# Patient Record
Sex: Male | Born: 1935 | Race: Black or African American | Hispanic: No | Marital: Married | Smoking: Former smoker
Health system: Southern US, Community
[De-identification: ages and names within clinical notes are randomized; demographics above are authoritative.]

## PROBLEM LIST (undated history)

## (undated) DIAGNOSIS — N189 Chronic kidney disease, unspecified: Secondary | ICD-10-CM

## (undated) DIAGNOSIS — R339 Retention of urine, unspecified: Secondary | ICD-10-CM

## (undated) DIAGNOSIS — I639 Cerebral infarction, unspecified: Secondary | ICD-10-CM

## (undated) DIAGNOSIS — E7800 Pure hypercholesterolemia, unspecified: Secondary | ICD-10-CM

## (undated) DIAGNOSIS — K4090 Unilateral inguinal hernia, without obstruction or gangrene, not specified as recurrent: Secondary | ICD-10-CM

## (undated) DIAGNOSIS — I4891 Unspecified atrial fibrillation: Secondary | ICD-10-CM

## (undated) DIAGNOSIS — I10 Essential (primary) hypertension: Secondary | ICD-10-CM

## (undated) HISTORY — PX: APPENDECTOMY: SHX54

## (undated) HISTORY — DX: Retention of urine, unspecified: R33.9

---

## 2004-04-07 ENCOUNTER — Inpatient Hospital Stay: Payer: Self-pay | Admitting: Internal Medicine

## 2004-04-09 ENCOUNTER — Ambulatory Visit: Payer: Self-pay | Admitting: Cardiology

## 2004-04-09 ENCOUNTER — Inpatient Hospital Stay (HOSPITAL_COMMUNITY): Admission: AD | Admit: 2004-04-09 | Discharge: 2004-04-25 | Payer: Self-pay | Admitting: Neurology

## 2004-04-14 ENCOUNTER — Ambulatory Visit: Payer: Self-pay | Admitting: Physical Medicine & Rehabilitation

## 2004-04-25 ENCOUNTER — Inpatient Hospital Stay (HOSPITAL_COMMUNITY)
Admission: RE | Admit: 2004-04-25 | Discharge: 2004-05-26 | Payer: Self-pay | Admitting: Physical Medicine & Rehabilitation

## 2004-06-17 ENCOUNTER — Ambulatory Visit (HOSPITAL_COMMUNITY)
Admission: RE | Admit: 2004-06-17 | Discharge: 2004-06-17 | Payer: Self-pay | Admitting: Physical Medicine & Rehabilitation

## 2004-06-20 ENCOUNTER — Ambulatory Visit: Payer: Self-pay | Admitting: Cardiology

## 2004-06-21 ENCOUNTER — Encounter: Payer: Self-pay | Admitting: Internal Medicine

## 2004-06-25 ENCOUNTER — Encounter: Payer: Self-pay | Admitting: Internal Medicine

## 2004-07-05 ENCOUNTER — Encounter
Admission: RE | Admit: 2004-07-05 | Discharge: 2004-10-03 | Payer: Self-pay | Admitting: Physical Medicine & Rehabilitation

## 2004-07-07 ENCOUNTER — Ambulatory Visit: Payer: Self-pay | Admitting: Physical Medicine & Rehabilitation

## 2004-07-25 ENCOUNTER — Encounter: Payer: Self-pay | Admitting: Internal Medicine

## 2004-08-23 ENCOUNTER — Ambulatory Visit: Payer: Self-pay | Admitting: Cardiology

## 2004-08-25 ENCOUNTER — Encounter: Payer: Self-pay | Admitting: Internal Medicine

## 2004-08-31 ENCOUNTER — Ambulatory Visit (HOSPITAL_COMMUNITY)
Admission: RE | Admit: 2004-08-31 | Discharge: 2004-08-31 | Payer: Self-pay | Admitting: Physical Medicine & Rehabilitation

## 2004-09-24 ENCOUNTER — Inpatient Hospital Stay: Payer: Self-pay | Admitting: Internal Medicine

## 2004-09-24 ENCOUNTER — Other Ambulatory Visit: Payer: Self-pay

## 2004-09-25 ENCOUNTER — Other Ambulatory Visit: Payer: Self-pay

## 2004-10-21 ENCOUNTER — Encounter: Payer: Self-pay | Admitting: Internal Medicine

## 2004-10-25 ENCOUNTER — Encounter: Payer: Self-pay | Admitting: Internal Medicine

## 2004-11-04 ENCOUNTER — Encounter
Admission: RE | Admit: 2004-11-04 | Discharge: 2005-02-02 | Payer: Self-pay | Admitting: Physical Medicine & Rehabilitation

## 2004-11-04 ENCOUNTER — Ambulatory Visit: Payer: Self-pay | Admitting: Physical Medicine & Rehabilitation

## 2004-11-25 ENCOUNTER — Encounter: Payer: Self-pay | Admitting: Internal Medicine

## 2004-11-30 ENCOUNTER — Ambulatory Visit: Payer: Self-pay | Admitting: Physical Medicine & Rehabilitation

## 2004-12-25 ENCOUNTER — Encounter: Payer: Self-pay | Admitting: Internal Medicine

## 2004-12-30 ENCOUNTER — Ambulatory Visit: Payer: Self-pay | Admitting: Physical Medicine & Rehabilitation

## 2005-01-25 ENCOUNTER — Encounter: Payer: Self-pay | Admitting: Internal Medicine

## 2005-02-24 ENCOUNTER — Encounter: Payer: Self-pay | Admitting: Internal Medicine

## 2005-02-25 ENCOUNTER — Encounter
Admission: RE | Admit: 2005-02-25 | Discharge: 2005-05-26 | Payer: Self-pay | Admitting: Physical Medicine & Rehabilitation

## 2005-02-25 ENCOUNTER — Ambulatory Visit: Payer: Self-pay | Admitting: Physical Medicine & Rehabilitation

## 2006-10-15 IMAGING — CT CT HEAD W/O CM
1 of 2 series · 13 of 30 positions shown, 17 images · non-contrast
Comparison: 04/10/2004

CLINICAL DATA: Intracerebral hemorrhage

HEAD CT WITHOUT CONTRAST
TECHNIQUE: 5mm collimated images were obtained from the base of the skull
through the vertex according to standard protocol without contrast.

[Series 2: brain · axial · 0.47mm/px · z∈[+128,+253]mm · 13 of 32 slices shown, 17 images]
[im 3/32  brain]
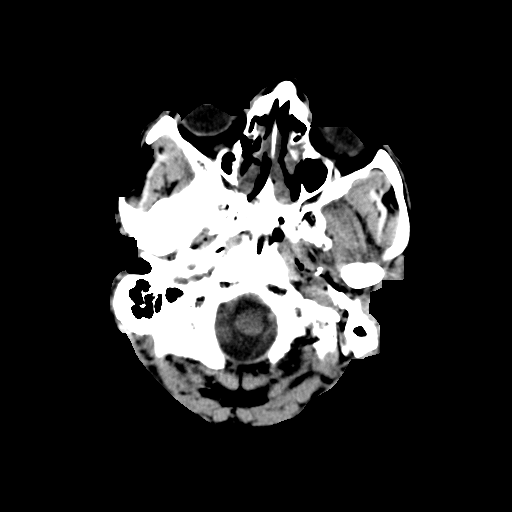
[im 3/32  bone]
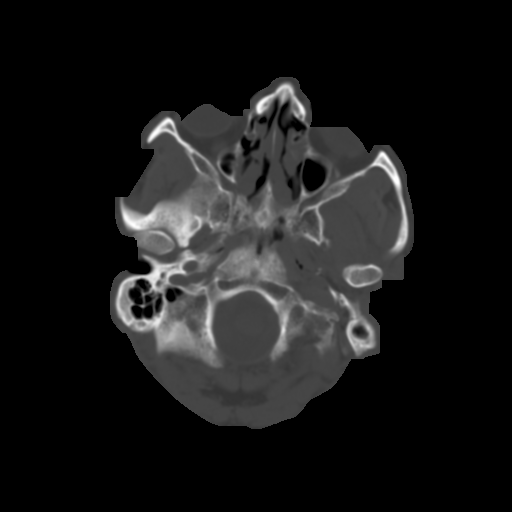
[im 5/32  brain]
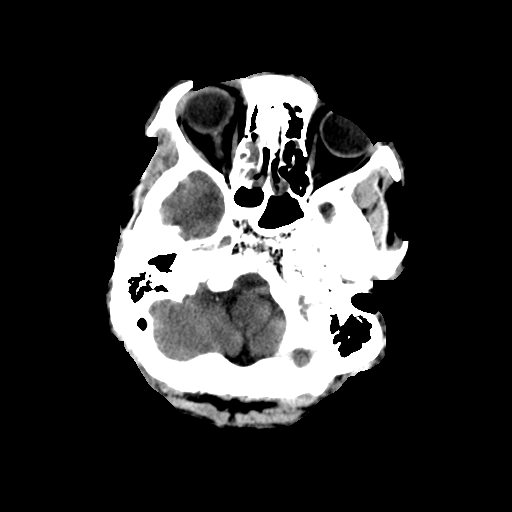
[im 7/32  brain]
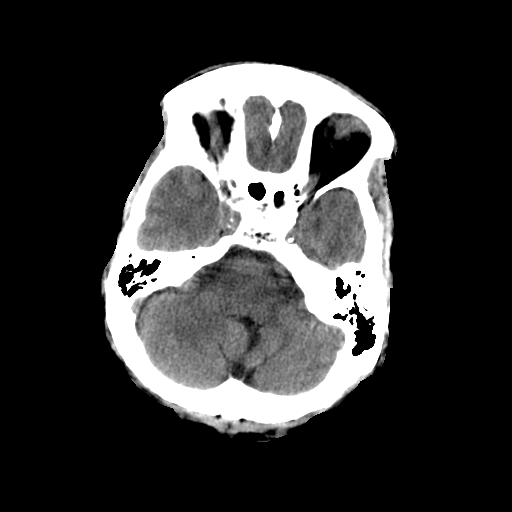
[im 9/32  brain]
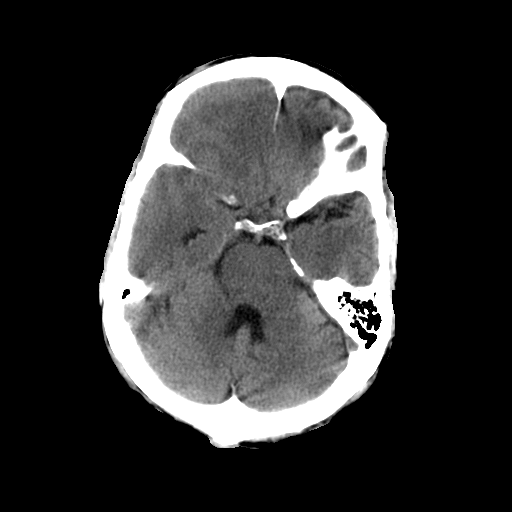
[im 12/32  brain]
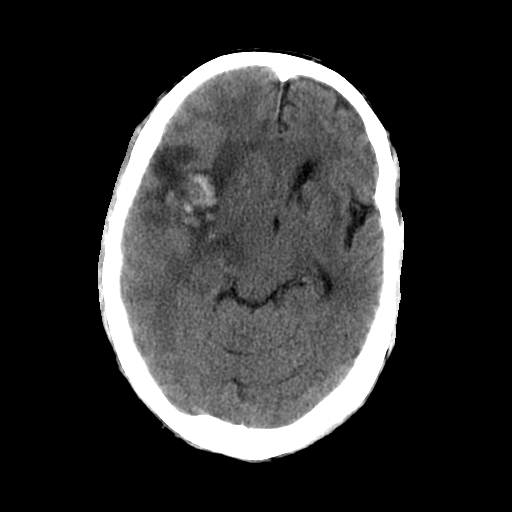
[im 12/32  bone]
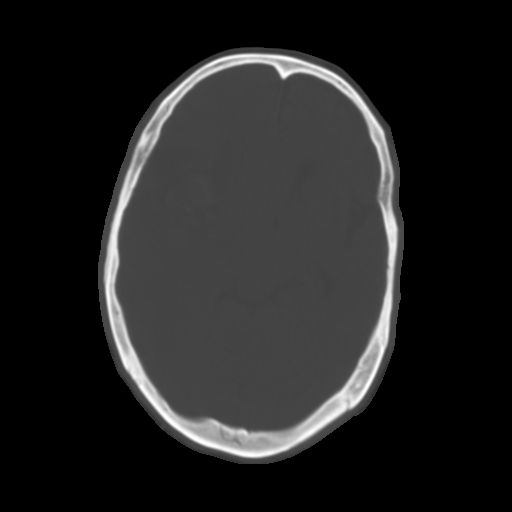
[im 14/32  brain]
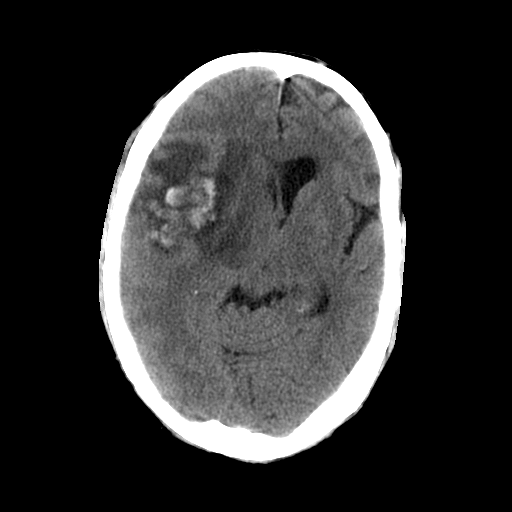
[im 16/32  brain]
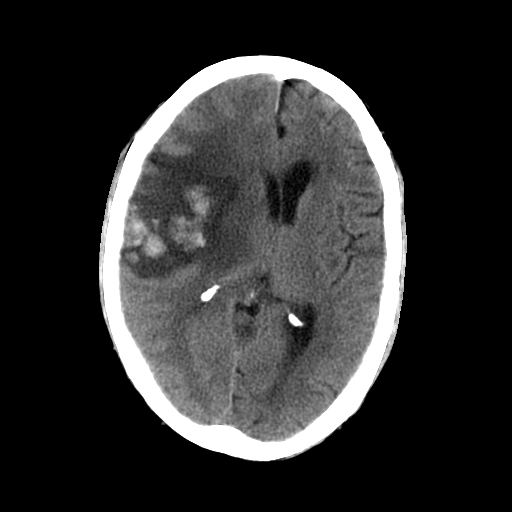
[im 18/32  brain]
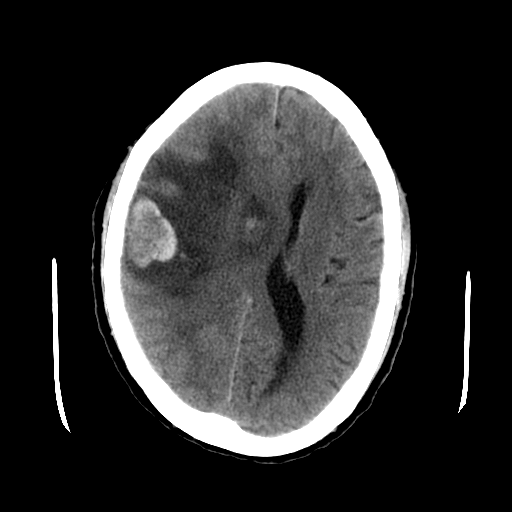
[im 20/32  brain]
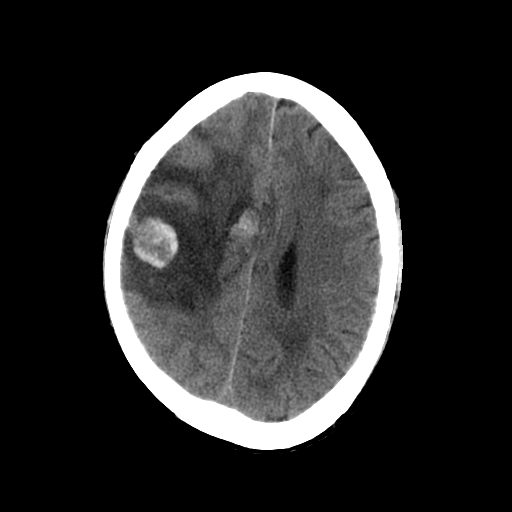
[im 20/32  bone]
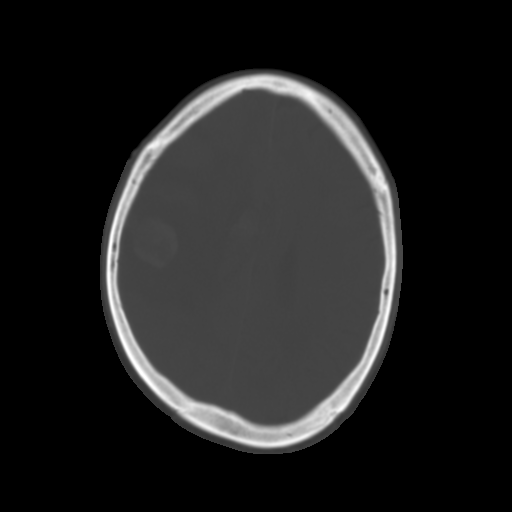
[im 23/32  brain]
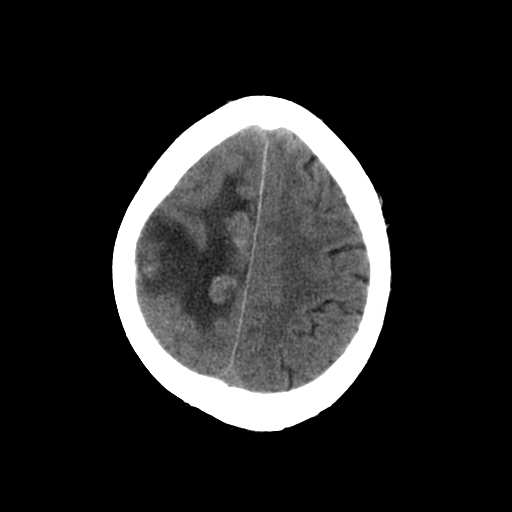
[im 25/32  brain]
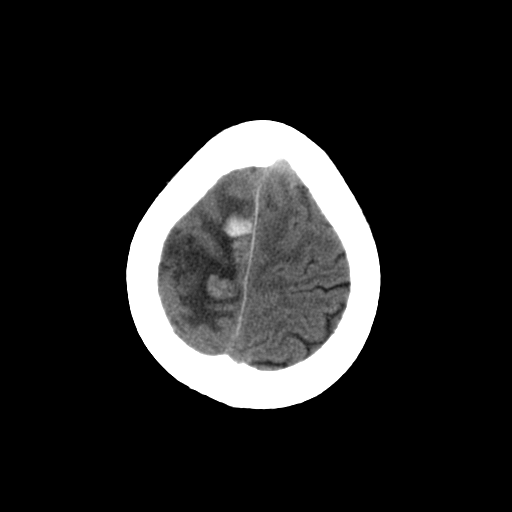
[im 27/32  brain]
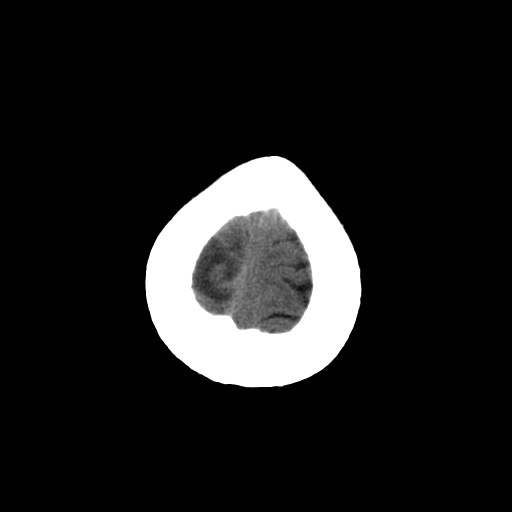
[im 29/32  brain]
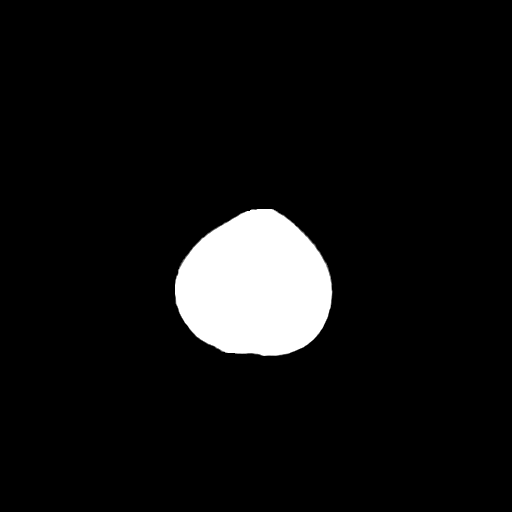
[im 29/32  bone]
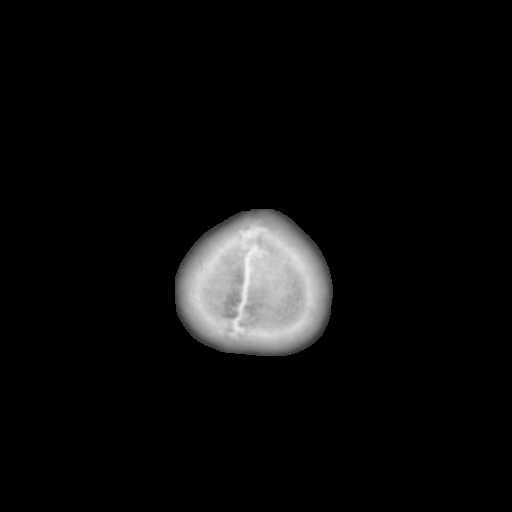

[13 of 30 positions shown; findings below may reference images not displayed]

FINDINGS: Again noted is the multifocal right cerebral hemorrhage, most
predominant in the right frontal and parietal lobes. There is surrounding edema
with mass-effect. Increasing midline shift noted, now approximately 17 mm.
Stable mild dilatation of the temporal horn of the right lateral ventricle.

IMPRESSION

Multifocal right cerebral hemorrhage, with increasing midline shift, now 17 mm
from right-to-left

## 2006-10-23 IMAGING — NM NM MYOCAR MULTI W/ SPECT
1 series · 1 of 1 positions shown · non-contrast
Comparison: None.

MYOCARDIAL IMAGING WITH SPECT (REST AND PHARMACOLOGIC-STRESS)  04/19/2004:

CLINICAL DATA: 76-year-old with history of MI, stroke, and hypertension.
TECHNIQUE: Standard myocardial SPECT imaging was performed after resting
intravenous injection of 10 mCi Yc-OOm Myoview .  Subsequently, intravenous
infusion of adenosine was performed under the supervision of the Cardiology
staff.  At peak effect of the drug, 30 mCi Yc-OOm Myoview was injected
intravenously and standard myocardial SPECT imaging was performed.  Quantitative
gated imaging was not performed due to arrhythmia.

[rc rest cardiolite · 1 of 1 slices shown]
[im 1/1]
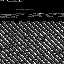

[1 of 1 positions shown; findings below may reference images not displayed]

FINDINGS: The immediate post adenosine images demonstrates diminished uptake in
the inferior wall relative to the remainder of the myocardium. The initial
resting images showed similar findings. There is no evidence of reversibility to
suggest ischemia. This is confirmed by the computer-generated polar map.

Again, gating was not performed due to arrhythmia.
IMPRESSION: Inferior wall thinning versus scar. No evidence of myocardial ischemia.

## 2010-04-16 ENCOUNTER — Encounter: Payer: Self-pay | Admitting: Physical Medicine & Rehabilitation

## 2010-04-17 ENCOUNTER — Encounter: Payer: Self-pay | Admitting: Physical Medicine & Rehabilitation

## 2015-02-25 ENCOUNTER — Emergency Department
Admission: EM | Admit: 2015-02-25 | Discharge: 2015-02-25 | Disposition: A | Discharge status: Home / Self Care | Payer: Medicare HMO | Attending: Emergency Medicine | Admitting: Emergency Medicine

## 2015-02-25 DIAGNOSIS — Z7982 Long term (current) use of aspirin: Secondary | ICD-10-CM | POA: Insufficient documentation

## 2015-02-25 DIAGNOSIS — Z7901 Long term (current) use of anticoagulants: Secondary | ICD-10-CM | POA: Insufficient documentation

## 2015-02-25 DIAGNOSIS — Z79899 Other long term (current) drug therapy: Secondary | ICD-10-CM | POA: Insufficient documentation

## 2015-02-25 DIAGNOSIS — I129 Hypertensive chronic kidney disease with stage 1 through stage 4 chronic kidney disease, or unspecified chronic kidney disease: Secondary | ICD-10-CM | POA: Diagnosis not present

## 2015-02-25 DIAGNOSIS — N189 Chronic kidney disease, unspecified: Secondary | ICD-10-CM | POA: Insufficient documentation

## 2015-02-25 DIAGNOSIS — R300 Dysuria: Secondary | ICD-10-CM | POA: Diagnosis present

## 2015-02-25 DIAGNOSIS — N390 Urinary tract infection, site not specified: Secondary | ICD-10-CM

## 2015-02-25 HISTORY — DX: Unilateral inguinal hernia, without obstruction or gangrene, not specified as recurrent: K40.90

## 2015-02-25 HISTORY — DX: Essential (primary) hypertension: I10

## 2015-02-25 HISTORY — DX: Cerebral infarction, unspecified: I63.9

## 2015-02-25 HISTORY — DX: Pure hypercholesterolemia, unspecified: E78.00

## 2015-02-25 HISTORY — DX: Chronic kidney disease, unspecified: N18.9

## 2015-02-25 HISTORY — DX: Unspecified atrial fibrillation: I48.91

## 2015-02-25 LAB — BASIC METABOLIC PANEL
Anion gap: 9 (ref 5–15)
BUN: 41 mg/dL — AB (ref 6–20)
CHLORIDE: 105 mmol/L (ref 101–111)
CO2: 26 mmol/L (ref 22–32)
CREATININE: 1.55 mg/dL — AB (ref 0.61–1.24)
Calcium: 8.6 mg/dL — ABNORMAL LOW (ref 8.9–10.3)
GFR calc Af Amer: 45 mL/min — ABNORMAL LOW (ref >60)
GFR calc non Af Amer: 39 mL/min — ABNORMAL LOW (ref >60)
GLUCOSE: 128 mg/dL — AB (ref 65–99)
POTASSIUM: 4.2 mmol/L (ref 3.5–5.1)
SODIUM: 140 mmol/L (ref 135–145)

## 2015-02-25 LAB — CBC WITH DIFFERENTIAL/PLATELET
Basophils Absolute: 0 10*3/uL (ref 0–0.1)
Basophils Relative: 0 %
EOS ABS: 0.1 10*3/uL (ref 0–0.7)
Eosinophils Relative: 1 %
HCT: 27.8 % — ABNORMAL LOW (ref 40.0–52.0)
HEMOGLOBIN: 9 g/dL — AB (ref 13.0–18.0)
LYMPHS ABS: 1 10*3/uL (ref 1.0–3.6)
LYMPHS PCT: 8 %
MCH: 29.1 pg (ref 26.0–34.0)
MCHC: 32.2 g/dL (ref 32.0–36.0)
MCV: 90.1 fL (ref 80.0–100.0)
MONOS PCT: 7 %
Monocytes Absolute: 0.8 10*3/uL (ref 0.2–1.0)
NEUTROS PCT: 84 %
Neutro Abs: 9.6 10*3/uL — ABNORMAL HIGH (ref 1.4–6.5)
Platelets: 304 10*3/uL (ref 150–440)
RBC: 3.08 MIL/uL — AB (ref 4.40–5.90)
RDW: 15 % — ABNORMAL HIGH (ref 11.5–14.5)
WBC: 11.5 10*3/uL — AB (ref 3.8–10.6)

## 2015-02-25 LAB — URINALYSIS COMPLETE WITH MICROSCOPIC (ARMC ONLY)
Bilirubin Urine: NEGATIVE
Glucose, UA: NEGATIVE mg/dL
KETONES UR: NEGATIVE mg/dL
NITRITE: NEGATIVE
PH: 5 (ref 5.0–8.0)
PROTEIN: 30 mg/dL — AB
SQUAMOUS EPITHELIAL / LPF: NONE SEEN
Specific Gravity, Urine: 1.013 (ref 1.005–1.030)

## 2015-02-25 MED ORDER — DEXTROSE 5 % IV SOLN
1.0000 g | Freq: Once | INTRAVENOUS | Status: AC
  Administered 2015-02-25: 1 g via INTRAVENOUS
  Filled 2015-02-25: qty 10

## 2015-02-25 MED ORDER — CEPHALEXIN 500 MG PO CAPS: 500.0000 mg | ORAL_CAPSULE | Freq: Three times a day (TID) | ORAL | Status: DC

## 2015-02-25 NOTE — ED Notes (Signed)
[facility]  Pt alert.  Iv fluids infusing.  Family with pt.  Pt watching tv.  Pt waiting on er disposition.

## 2015-02-25 NOTE — ED Notes (Signed)
Per EMs patient reports urinary burning and incontinence.  Also complains of RLQ pain when he tries to void.  Pt has smell of UTI.  Pt denies fever. Pt has left sided weakness from a previous stroke in 2006

## 2015-02-25 NOTE — Discharge Instructions (Signed)
Please seek medical attention for any high fevers, chest pain, shortness of breath, change in behavior, persistent vomiting, bloody stool or any other new or concerning symptoms. ° ° °Urinary Tract Infection °Urinary tract infections (UTIs) can develop anywhere along your urinary tract. Your urinary tract is your body's drainage system for removing wastes and extra water. Your urinary tract includes two kidneys, two ureters, a bladder, and a urethra. Your kidneys are a pair of bean-shaped organs. Each kidney is about the size of your fist. They are located below your ribs, one on each side of your spine. °CAUSES °Infections are caused by microbes, which are microscopic organisms, including fungi, viruses, and bacteria. These organisms are so small that they can only be seen through a microscope. Bacteria are the microbes that most commonly cause UTIs. °SYMPTOMS  °Symptoms of UTIs may vary by age and gender of the patient and by the location of the infection. Symptoms in young women typically include a frequent and intense urge to urinate and a painful, burning feeling in the bladder or urethra during urination. Older women and men are more likely to be tired, shaky, and weak and have muscle aches and abdominal pain. A fever may mean the infection is in your kidneys. Other symptoms of a kidney infection include pain in your back or sides below the ribs, nausea, and vomiting. °DIAGNOSIS °To diagnose a UTI, your caregiver will ask you about your symptoms. Your caregiver will also ask you to provide a urine sample. The urine sample will be tested for bacteria and white blood cells. White blood cells are made by your body to help fight infection. °TREATMENT  °Typically, UTIs can be treated with medication. Because most UTIs are caused by a bacterial infection, they usually can be treated with the use of antibiotics. The choice of antibiotic and length of treatment depend on your symptoms and the type of bacteria causing  your infection. °HOME CARE INSTRUCTIONS °· If you were prescribed antibiotics, take them exactly as your caregiver instructs you. Finish the medication even if you feel better after you have only taken some of the medication. °· Drink enough water and fluids to keep your urine clear or pale yellow. °· Avoid caffeine, tea, and carbonated beverages. They tend to irritate your bladder. °· Empty your bladder often. Avoid holding urine for long periods of time. °· Empty your bladder before and after sexual intercourse. °· After a bowel movement, women should cleanse from front to back. Use each tissue only once. °SEEK MEDICAL CARE IF:  °· You have back pain. °· You develop a fever. °· Your symptoms do not begin to resolve within 3 days. °SEEK IMMEDIATE MEDICAL CARE IF:  °· You have severe back pain or lower abdominal pain. °· You develop chills. °· You have nausea or vomiting. °· You have continued burning or discomfort with urination. °MAKE SURE YOU:  °· Understand these instructions. °· Will watch your condition. °· Will get help right away if you are not doing well or get worse. °  °This information is not intended to replace advice given to you by your health care provider. Make sure you discuss any questions you have with your health care provider. °  °Document Released: 12/21/2004 Document Revised: 12/02/2014 Document Reviewed: 04/21/2011 °Elsevier Interactive Patient Education ©2016 [employer] ° °

## 2015-02-25 NOTE — ED Provider Notes (Signed)
[facility] Emergency Department Provider Note  ____________________________________________  Time seen: 1335  I have reviewed the triage vital signs and the nursing notes.   HISTORY  Chief Complaint Dysuria   History limited by: Not Limited   HPI Jordan Kline is a 81 y.o. male  who presents to the emergency department today with complaints of suprapubic discomfort and dysuria. The patient states that these symptoms have been present for the past 3 days. He states as long as he is sitting still he does not have any pain however when he urinates he does have the pain. He states this is been for pretty much every urination for the past 3 days. He states he has not had any fevers. Denies any nausea or vomiting. Denies any diarrhea. He had similar symptoms a couple of years ago when he was diagnosed with a urinary tract infection.  Past Medical History  Diagnosis Date  . Stroke (HCC)   . Hypertension   . Chronic kidney disease (CKD)   . High cholesterol   . A-fib (HCC)   . Inguinal hernia     There are no active problems to display for this patient.   History reviewed. No pertinent past surgical history.  Current Outpatient Rx  Name  Route  Sig  Dispense  Refill  . aspirin 81 MG tablet   Oral   Take 81 mg by mouth daily.         Marland Kitchen atorvastatin (LIPITOR) 80 MG tablet   Oral   Take 80 mg by mouth daily.         Marland Kitchen diltiazem (DILACOR XR) 120 MG 24 hr capsule   Oral   Take 120 mg by mouth daily.         Marland Kitchen docusate sodium (COLACE) 100 MG capsule   Oral   Take 200 mg by mouth 2 (two) times daily.         Marland Kitchen lubiprostone (AMITIZA) 24 MCG capsule   Oral   Take 24 mcg by mouth 2 (two) times daily with a meal.         . metoprolol succinate (TOPROL-XL) 100 MG 24 hr tablet   Oral   Take 100 mg by mouth daily. Take with or immediately following a meal.         . Multiple Vitamin (MULTIVITAMIN) capsule   Oral   Take 1 capsule by mouth  daily.         . ramipril (ALTACE) 5 MG capsule   Oral   Take 5 mg by mouth daily.         . rivaroxaban (XARELTO) 20 MG TABS tablet   Oral   Take 20 mg by mouth daily with supper.         . triamterene-hydrochlorothiazide (MAXZIDE-25) 37.5-25 MG tablet   Oral   Take 1 tablet by mouth daily.           Allergies Review of patient's allergies indicates no known allergies.  No family history on file.  Social History Social History  Substance Use Topics  . Smoking status: Never Smoker   . Smokeless tobacco: None  . Alcohol Use: No    Review of Systems  Constitutional: Negative for fever. Cardiovascular: Negative for chest pain. Respiratory: Negative for shortness of breath. Gastrointestinal: Positive suprapubic pain Genitourinary: Positive for dysuria. Musculoskeletal: Negative for back pain. Skin: Negative for rash. Neurological: Negative for headaches, focal weakness or numbness.  10-point ROS otherwise negative.  ____________________________________________  PHYSICAL EXAM:  VITAL SIGNS: ED Triage Vitals  Enc Vitals Group     BP 02/25/15 1120 138/90 mmHg     Pulse Rate 02/25/15 1120 100     Resp 02/25/15 1120 18     Temp 02/25/15 1120 98.1 F (36.7 C)     Temp Source 02/25/15 1120 Oral     SpO2 02/25/15 1120 98 %     Weight 02/25/15 1120 117 lb 4.8 oz (53.207 kg)     Height 02/25/15 1120  (1.727 m)     Head Cir --      Peak Flow --      Pain Score 02/25/15 1124 8   Constitutional: Alert and oriented. Well appearing and in no distress. Eyes: Conjunctivae are normal. PERRL. Normal extraocular movements. ENT   Head: Normocephalic and atraumatic.   Nose: No congestion/rhinnorhea.   Mouth/Throat: Mucous membranes are moist.   Neck: No stridor. Hematological/Lymphatic/Immunilogical: No cervical lymphadenopathy. Cardiovascular: Normal rate, regular rhythm.  No murmurs, rubs, or gallops. Respiratory: Normal respiratory effort  without tachypnea nor retractions. Breath sounds are clear and equal bilaterally. No wheezes/rales/rhonchi. Gastrointestinal: Soft and nontender. No distention.  Genitourinary: Deferred Musculoskeletal: Normal range of motion in all extremities. No joint effusions.  No lower extremity tenderness nor edema. Neurologic:  Normal speech and language. No gross focal neurologic deficits are appreciated.  Skin:  Skin is warm, dry and intact. No rash noted. Psychiatric: Mood and affect are normal. Speech and behavior are normal. Patient exhibits appropriate insight and judgment.  ____________________________________________    LABS (pertinent positives/negatives)  Labs Reviewed  URINALYSIS COMPLETEWITH MICROSCOPIC ([facility] ONLY) - Abnormal; Notable for the following:    Color, Urine YELLOW (*)    APPearance CLOUDY (*)    Hgb urine dipstick 2+ (*)    Protein, ur 30 (*)    Leukocytes, UA 3+ (*)    Bacteria, UA FEW (*)    All other components within normal limits  CBC WITH DIFFERENTIAL/PLATELET - Abnormal; Notable for the following:    WBC 11.5 (*)    RBC 3.08 (*)    Hemoglobin 9.0 (*)    HCT 27.8 (*)    RDW 15.0 (*)    Neutro Abs 9.6 (*)    All other components within normal limits  BASIC METABOLIC PANEL - Abnormal; Notable for the following:    Glucose, Bld 128 (*)    BUN 41 (*)    Creatinine, Ser 1.55 (*)    Calcium 8.6 (*)    GFR calc non Af Amer 39 (*)    GFR calc Af Amer 45 (*)    All other components within normal limits     ____________________________________________   EKG  None  ____________________________________________    RADIOLOGY  None   ____________________________________________   PROCEDURES  Procedure(s) performed: None  Critical Care performed: No  ____________________________________________   INITIAL IMPRESSION / ASSESSMENT AND PLAN / ED COURSE  Pertinent labs & imaging results that were available during my care of the patient were  reviewed by me and considered in my medical decision making (see chart for details).  Patient presented to the emergency department today because of concerns for dysuria. UA is consistent with a urinary tract infection. Just mild leukocytosis. I doubt kidney stone given lack of pain at rest. Pain is only there with urination. Pain is primarily in the suprapubic region and not in the flank. Will give dose of IV antibiotics here. Will discharge with oral prescription.  ____________________________________________  FINAL CLINICAL IMPRESSION(S) / ED DIAGNOSES  Final diagnoses:  UTI (lower urinary tract infection)     Phineas SemenGraydon Deloria Brassfield, MD 02/26/15 1213

## 2015-03-01 ENCOUNTER — Inpatient Hospital Stay
Admission: EM | Admit: 2015-03-01 | Discharge: 2015-03-04 | DRG: 683 | Disposition: A | Discharge status: Home / Self Care | Payer: Medicare HMO | Attending: Internal Medicine | Admitting: Internal Medicine

## 2015-03-01 ENCOUNTER — Emergency Department: Payer: Medicare HMO

## 2015-03-01 DIAGNOSIS — Z79899 Other long term (current) drug therapy: Secondary | ICD-10-CM

## 2015-03-01 DIAGNOSIS — Z7982 Long term (current) use of aspirin: Secondary | ICD-10-CM | POA: Diagnosis not present

## 2015-03-01 DIAGNOSIS — I4891 Unspecified atrial fibrillation: Secondary | ICD-10-CM | POA: Diagnosis present

## 2015-03-01 DIAGNOSIS — N179 Acute kidney failure, unspecified: Secondary | ICD-10-CM | POA: Diagnosis not present

## 2015-03-01 DIAGNOSIS — N183 Chronic kidney disease, stage 3 (moderate): Secondary | ICD-10-CM | POA: Diagnosis present

## 2015-03-01 DIAGNOSIS — R339 Retention of urine, unspecified: Secondary | ICD-10-CM | POA: Diagnosis not present

## 2015-03-01 DIAGNOSIS — N390 Urinary tract infection, site not specified: Secondary | ICD-10-CM | POA: Diagnosis present

## 2015-03-01 DIAGNOSIS — Z7902 Long term (current) use of antithrombotics/antiplatelets: Secondary | ICD-10-CM | POA: Diagnosis not present

## 2015-03-01 DIAGNOSIS — N2589 Other disorders resulting from impaired renal tubular function: Secondary | ICD-10-CM | POA: Diagnosis present

## 2015-03-01 DIAGNOSIS — N138 Other obstructive and reflux uropathy: Secondary | ICD-10-CM | POA: Diagnosis present

## 2015-03-01 DIAGNOSIS — Z9049 Acquired absence of other specified parts of digestive tract: Secondary | ICD-10-CM | POA: Diagnosis not present

## 2015-03-01 DIAGNOSIS — R338 Other retention of urine: Secondary | ICD-10-CM | POA: Diagnosis not present

## 2015-03-01 DIAGNOSIS — D649 Anemia, unspecified: Secondary | ICD-10-CM | POA: Diagnosis present

## 2015-03-01 DIAGNOSIS — E875 Hyperkalemia: Secondary | ICD-10-CM | POA: Diagnosis present

## 2015-03-01 DIAGNOSIS — Z66 Do not resuscitate: Secondary | ICD-10-CM | POA: Diagnosis present

## 2015-03-01 DIAGNOSIS — R310 Gross hematuria: Secondary | ICD-10-CM | POA: Diagnosis present

## 2015-03-01 DIAGNOSIS — Z7901 Long term (current) use of anticoagulants: Secondary | ICD-10-CM

## 2015-03-01 DIAGNOSIS — N401 Enlarged prostate with lower urinary tract symptoms: Secondary | ICD-10-CM | POA: Diagnosis present

## 2015-03-01 DIAGNOSIS — I129 Hypertensive chronic kidney disease with stage 1 through stage 4 chronic kidney disease, or unspecified chronic kidney disease: Secondary | ICD-10-CM | POA: Diagnosis present

## 2015-03-01 DIAGNOSIS — E7800 Pure hypercholesterolemia, unspecified: Secondary | ICD-10-CM | POA: Diagnosis present

## 2015-03-01 DIAGNOSIS — Z8249 Family history of ischemic heart disease and other diseases of the circulatory system: Secondary | ICD-10-CM

## 2015-03-01 DIAGNOSIS — K4090 Unilateral inguinal hernia, without obstruction or gangrene, not specified as recurrent: Secondary | ICD-10-CM | POA: Diagnosis present

## 2015-03-01 DIAGNOSIS — E872 Acidosis: Secondary | ICD-10-CM | POA: Diagnosis present

## 2015-03-01 DIAGNOSIS — I69354 Hemiplegia and hemiparesis following cerebral infarction affecting left non-dominant side: Secondary | ICD-10-CM

## 2015-03-01 LAB — HEMOGLOBIN: HEMOGLOBIN: 8.1 g/dL — AB (ref 13.0–18.0)

## 2015-03-01 LAB — URINALYSIS COMPLETE WITH MICROSCOPIC (ARMC ONLY)
Bilirubin Urine: NEGATIVE
Glucose, UA: NEGATIVE mg/dL
NITRITE: NEGATIVE
PH: 5 (ref 5.0–8.0)
PROTEIN: 100 mg/dL — AB
SPECIFIC GRAVITY, URINE: 1.015 (ref 1.005–1.030)
Squamous Epithelial / LPF: NONE SEEN

## 2015-03-01 LAB — BASIC METABOLIC PANEL
ANION GAP: 13 (ref 5–15)
BUN: 65 mg/dL — ABNORMAL HIGH (ref 6–20)
CALCIUM: 8.8 mg/dL — AB (ref 8.9–10.3)
CO2: 22 mmol/L (ref 22–32)
Chloride: 105 mmol/L (ref 101–111)
Creatinine, Ser: 4.98 mg/dL — ABNORMAL HIGH (ref 0.61–1.24)
GFR calc Af Amer: 11 mL/min — ABNORMAL LOW (ref >60)
GFR, EST NON AFRICAN AMERICAN: 10 mL/min — AB (ref >60)
GLUCOSE: 92 mg/dL (ref 65–99)
Potassium: 5 mmol/L (ref 3.5–5.1)
Sodium: 140 mmol/L (ref 135–145)

## 2015-03-01 LAB — CBC
HEMATOCRIT: 27.3 % — AB (ref 40.0–52.0)
HEMOGLOBIN: 9.1 g/dL — AB (ref 13.0–18.0)
MCH: 29.8 pg (ref 26.0–34.0)
MCHC: 33.3 g/dL (ref 32.0–36.0)
MCV: 89.6 fL (ref 80.0–100.0)
Platelets: 296 10*3/uL (ref 150–440)
RBC: 3.05 MIL/uL — AB (ref 4.40–5.90)
RDW: 15.9 % — ABNORMAL HIGH (ref 11.5–14.5)
WBC: 9.9 10*3/uL (ref 3.8–10.6)

## 2015-03-01 MED ORDER — RAMIPRIL 5 MG PO CAPS
5.0000 mg | ORAL_CAPSULE | Freq: Every day | ORAL | Status: DC
  Administered 2015-03-01 – 2015-03-02 (×2): 5 mg via ORAL
  Filled 2015-03-01 (×2): qty 1

## 2015-03-01 MED ORDER — DOCUSATE SODIUM 100 MG PO CAPS
200.0000 mg | ORAL_CAPSULE | Freq: Two times a day (BID) | ORAL | Status: DC
  Administered 2015-03-01 – 2015-03-02 (×2): 200 mg via ORAL
  Filled 2015-03-01 (×2): qty 2

## 2015-03-01 MED ORDER — SODIUM CHLORIDE 0.9 % IV BOLUS (SEPSIS)
1000.0000 mL | Freq: Once | INTRAVENOUS | Status: AC
  Administered 2015-03-01: 1000 mL via INTRAVENOUS

## 2015-03-01 MED ORDER — SODIUM CHLORIDE 0.9 % IV SOLN
INTRAVENOUS | Status: DC
  Administered 2015-03-01 – 2015-03-04 (×4): via INTRAVENOUS

## 2015-03-01 MED ORDER — ATORVASTATIN CALCIUM 20 MG PO TABS
80.0000 mg | ORAL_TABLET | Freq: Every evening | ORAL | Status: DC
Start: 2015-03-01 — End: 2015-03-04
  Administered 2015-03-01 – 2015-03-03 (×3): 80 mg via ORAL
  Filled 2015-03-01 (×3): qty 4

## 2015-03-01 MED ORDER — ADULT MULTIVITAMIN W/MINERALS CH
1.0000 | ORAL_TABLET | Freq: Every day | ORAL | Status: DC
  Administered 2015-03-01 – 2015-03-04 (×4): 1 via ORAL
  Filled 2015-03-01 (×4): qty 1

## 2015-03-01 MED ORDER — DILTIAZEM HCL ER COATED BEADS 120 MG PO CP24
120.0000 mg | ORAL_CAPSULE | Freq: Every evening | ORAL | Status: DC
  Administered 2015-03-01 – 2015-03-03 (×3): 120 mg via ORAL
  Filled 2015-03-01 (×3): qty 1

## 2015-03-01 MED ORDER — TRIAMTERENE-HCTZ 37.5-25 MG PO TABS
1.0000 | ORAL_TABLET | Freq: Every day | ORAL | Status: DC
  Administered 2015-03-01 – 2015-03-02 (×2): 1 via ORAL
  Filled 2015-03-01 (×2): qty 1

## 2015-03-01 MED ORDER — LUBIPROSTONE 24 MCG PO CAPS
24.0000 ug | ORAL_CAPSULE | Freq: Every day | ORAL | Status: DC
  Administered 2015-03-01 – 2015-03-04 (×4): 24 ug via ORAL
  Filled 2015-03-01 (×4): qty 1

## 2015-03-01 MED ORDER — DEXTROSE 5 % IV SOLN
1.0000 g | Freq: Once | INTRAVENOUS | Status: AC
  Administered 2015-03-01: 1 g via INTRAVENOUS
  Filled 2015-03-01 (×2): qty 10

## 2015-03-01 MED ORDER — METOPROLOL TARTRATE 50 MG PO TABS
100.0000 mg | ORAL_TABLET | Freq: Two times a day (BID) | ORAL | Status: DC
  Administered 2015-03-01 – 2015-03-04 (×5): 100 mg via ORAL
  Filled 2015-03-01 (×6): qty 2

## 2015-03-01 NOTE — ED Notes (Signed)
Bladder scan done. Greater than or urine showed on scan.

## 2015-03-01 NOTE — ED Notes (Signed)
Foley inserted 16 Fr. Pt tolerated well.

## 2015-03-01 NOTE — Plan of Care (Signed)
Problem: Urinary Elimination: Goal: Signs and symptoms of infection will decrease Outcome: Progressing Patient has no complaints of pain. VSS. Foley in place and draining red urine. Patient has left sided weakness and left arm is contracted.

## 2015-03-01 NOTE — ED Notes (Signed)
Unable to void x 24 hours, diagnosed with urinary tract infection 4 days ago

## 2015-03-01 NOTE — ED Provider Notes (Signed)
[facility] Emergency Department Provider Note  ____________________________________________   I have reviewed the triage vital signs and the nursing notes.   HISTORY  Chief Complaint Urinary Retention    HPI Jordan Kline is a 81 y.o. male  with a history of multiple medical problems including CVA with left-sided weakness hypertension chronic kidney disease high cholesterol and A. fib. Patient is anticoagulated. He was seen here 2 days ago for a urinary tract infection. He was started on Keflex. His creatinine at that time was 1.5. Patient states he has had no significant symptoms except for it was noted that he had not urinated in the last 24 hours and he was brought back in. Bladder scan showed that he was retaining urine and a Foley was placed. Patient at this time has no complaints. No pain. He has had ongoing dysuria until he was retaining. Patient has not had anything like this before. He has had no elevation in his temperature, no nausea no vomiting no diarrhea no other complaints. At this time he states he feels "A plus".  Past Medical History  Diagnosis Date  . Stroke (HCC)   . Hypertension   . Chronic kidney disease (CKD)   . High cholesterol   . A-fib (HCC)   . Inguinal hernia     There are no active problems to display for this patient.   Past Surgical History  Procedure Laterality Date  . Appendectomy      Current Outpatient Rx  Name  Route  Sig  Dispense  Refill  . aspirin 81 MG tablet   Oral   Take 81 mg by mouth daily.         Marland Kitchen. atorvastatin (LIPITOR) 80 MG tablet   Oral   Take 80 mg by mouth daily.         . cephALEXin (KEFLEX) 500 MG capsule   Oral   Take 1 capsule (500 mg total) by mouth 3 (three) times daily.   30 capsule   0   . diltiazem (DILACOR XR) 120 MG 24 hr capsule   Oral   Take 120 mg by mouth daily.         Marland Kitchen. docusate sodium (COLACE) 100 MG capsule   Oral   Take 200 mg by mouth 2 (two) times  daily.         Marland Kitchen. lubiprostone (AMITIZA) 24 MCG capsule   Oral   Take 24 mcg by mouth 2 (two) times daily with a meal.         . metoprolol succinate (TOPROL-XL) 100 MG 24 hr tablet   Oral   Take 100 mg by mouth daily. Take with or immediately following a meal.         . Multiple Vitamin (MULTIVITAMIN) capsule   Oral   Take 1 capsule by mouth daily.         . ramipril (ALTACE) 5 MG capsule   Oral   Take 5 mg by mouth daily.         . rivaroxaban (XARELTO) 20 MG TABS tablet   Oral   Take 20 mg by mouth daily with supper.         . triamterene-hydrochlorothiazide (MAXZIDE-25) 37.5-25 MG tablet   Oral   Take 1 tablet by mouth daily.           Allergies Review of patient's allergies indicates no known allergies.  No family history on file.  Social History Social History  Substance Use Topics  .  Smoking status: Never Smoker   . Smokeless tobacco: None  . Alcohol Use: No    Review of Systems Constitutional: No fever/chills Eyes: No visual changes. ENT: No sore throat. No stiff neck no neck pain Cardiovascular: Denies chest pain. Respiratory: Denies shortness of breath. Gastrointestinal:   no vomiting.  No diarrhea.  No constipation. Genitourinary: See history of present illness Musculoskeletal: Negative lower extremity swelling Skin: Negative for rash. Neurological: Negative for headaches, focal weakness or numbness. 10-point ROS otherwise negative.  ____________________________________________   PHYSICAL EXAM:  VITAL SIGNS: ED Triage Vitals  Enc Vitals Group     BP 03/01/15 1116 120/72 mmHg     Pulse Rate 03/01/15 1116 89     Resp 03/01/15 1116 18     Temp 03/01/15 1116 97.8 F (36.6 C)     Temp Source 03/01/15 1116 Oral     SpO2 03/01/15 1116 94 %     Weight 03/01/15 1116 145 lb (65.772 kg)     Height 03/01/15 1116  (1.727 m)     Head Cir --      Peak Flow --      Pain Score 03/01/15 1117 0     Pain Loc --      Pain Edu? --       Excl. in GC? --     Constitutional: Alert and oriented. Well appearing and in no acute distress. Eyes: Conjunctivae are normal. PERRL. EOMI. Head: Atraumatic. Nose: No congestion/rhinnorhea. Mouth/Throat: Mucous membranes are moist.  Oropharynx non-erythematous. Neck: No stridor.   Nontender with no meningismus Cardiovascular: Normal rate, regular rhythm. Grossly normal heart sounds.  Good peripheral circulation. Respiratory: Normal respiratory effort.  No retractions. Lungs CTAB. Abdominal: Soft and nontender. No distention. No guarding no rebound Back:  There is no focal tenderness or step off there is no midline tenderness there are no lesions noted. there is no CVA tenderness GU: Normal external genitalia with no obvious lesions noted Musculoskeletal: No lower extremity tenderness. No joint effusions, no DVT signs strong distal pulses no edema Neurologic:  Normal speech and language. Left sided weakness and chronic contraction of left upper surgery noted Skin:  Skin is warm, dry and intact. No rash noted. Psychiatric: Mood and affect are normal. Speech and behavior are normal.  ____________________________________________   LABS (all labs ordered are listed, but only abnormal results are displayed)  Labs Reviewed  BASIC METABOLIC PANEL - Abnormal; Notable for the following:    BUN 65 (*)    Creatinine, Ser 4.98 (*)    Calcium 8.8 (*)    GFR calc non Af Amer 10 (*)    GFR calc Af Amer 11 (*)    All other components within normal limits  CBC - Abnormal; Notable for the following:    RBC 3.05 (*)    Hemoglobin 9.1 (*)    HCT 27.3 (*)    RDW 15.9 (*)    All other components within normal limits  URINALYSIS COMPLETEWITH MICROSCOPIC ([facility] ONLY) - Abnormal; Notable for the following:    Color, Urine AMBER (*)    APPearance CLOUDY (*)    Ketones, ur TRACE (*)    Hgb urine dipstick 3+ (*)    Protein, ur 100 (*)    Leukocytes, UA 3+ (*)    Bacteria, UA RARE (*)    All other  components within normal limits  URINE CULTURE   ____________________________________________  EKG  I personally interpreted any EKGs ordered by me or triage  ____________________________________________  RADIOLOGY  I reviewed any imaging ordered by me or triage that were performed during my shift ____________________________________________   PROCEDURES  Procedure(s) performed: None  Critical Care performed: None  ____________________________________________   INITIAL IMPRESSION / ASSESSMENT AND PLAN / ED COURSE  Pertinent labs & imaging results that were available during my care of the patient were reviewed by me and considered in my medical decision making (see chart for details).  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 He may require ultrasound of his kidneys and further imaging as an inpatient but he does need to be admitted. We will talk to the hospitalist service and arrange that. I am sending a urine culture. ____________________________________________   FINAL CLINICAL IMPRESSION(S) / ED DIAGNOSES  Final diagnoses:  None     Jeanmarie Plant, MD 03/01/15 [phone]

## 2015-03-01 NOTE — H&P (Signed)
[facility] - [place] [facility]   PATIENT NAME: Jordan Kline    MR#:  [id number]  DATE OF BIRTH:  09-09-1927  DATE OF ADMISSION:  03/01/2015  PRIMARY CARE PHYSICIAN: Luna Fuse, MD   REQUESTING/REFERRING PHYSICIAN: Cherly Anderson.  CHIEF COMPLAINT:   Chief Complaint  Patient presents with  . Urinary Retention    HISTORY OF PRESENT ILLNESS: Rondey Fallen  is a 81 y.o. male with a known history of stroke with left-sided weakness, atrial fibrillation on Xarelto, chronic kidney disease, hypercholesterol, hypertension- was in emergency room 3 days ago and found to have urinary tract infection so he was sent home with oral cephalexin- in spite of taking it for last 3 days he is still did not feel better and is today morning he had only 50 mL urine output, he continued to feel pain in his lower abdomen so decided to come to emergency room today. Foley catheter was placed and he had 600 ML blood mixed urine came out initially and later on he continued to be slowly and almost had total 1 L urine output 15 few hours coming to ER. He was also noted to have worsening in his renal function- creatinine jumped up to 5. He said at baseline he has some urinary retention issues and some dribbling and his urine.  PAST MEDICAL HISTORY:   Past Medical History  Diagnosis Date  . Stroke (HCC)   . Hypertension   . Chronic kidney disease (CKD)   . High cholesterol   . A-fib (HCC)   . Inguinal hernia     PAST SURGICAL HISTORY:  Past Surgical History  Procedure Laterality Date  . Appendectomy      SOCIAL HISTORY:  Social History  Substance Use Topics  . Smoking status: Never Smoker   . Smokeless tobacco: Not on file  . Alcohol Use: No    FAMILY HISTORY:  Family History  Problem Relation Age of Onset  . Hypertension Father     DRUG ALLERGIES: No Known Allergies  REVIEW OF SYSTEMS:   CONSTITUTIONAL: No fever, fatigue or weakness.  EYES: No  blurred or double vision.  EARS, NOSE, AND THROAT: No tinnitus or ear pain.  RESPIRATORY: No cough, shortness of breath, wheezing or hemoptysis.  CARDIOVASCULAR: No chest pain, orthopnea, edema.  GASTROINTESTINAL: No nausea, vomiting, diarrhea or abdominal pain.  GENITOURINARY: No dysuria, positive hematuria. Positive for urinary retention ENDOCRINE: No polyuria, nocturia,  HEMATOLOGY: No anemia, easy bruising or bleeding SKIN: No rash or lesion. MUSCULOSKELETAL: No joint pain or arthritis.   NEUROLOGIC: No tingling, numbness, weakness.  PSYCHIATRY: No anxiety or depression.   MEDICATIONS AT HOME:  Prior to Admission medications   Medication Sig Start Date End Date Taking? Authorizing Provider  aspirin EC 81 MG tablet Take 81 mg by mouth daily.   Yes Historical Provider, MD  atorvastatin (LIPITOR) 80 MG tablet Take 80 mg by mouth every evening.    Yes Historical Provider, MD  cephALEXin (KEFLEX) 500 MG capsule Take 1 capsule (500 mg total) by mouth 3 (three) times daily. 02/25/15  Yes Phineas Semen, MD  diltiazem (CARDIZEM CD) 120 MG 24 hr capsule Take 120 mg by mouth every evening.    Yes Historical Provider, MD  docusate sodium (COLACE) 100 MG capsule Take 200 mg by mouth 2 (two) times daily.   Yes Historical Provider, MD  lubiprostone (AMITIZA) 24 MCG capsule Take 24 mcg by mouth daily.    Yes Historical Provider, MD  metoprolol (LOPRESSOR) 100 MG tablet Take 100 mg by mouth 2 (two) times daily.   Yes Historical Provider, MD  Multiple Vitamin (MULTIVITAMIN WITH MINERALS) TABS tablet Take 1 tablet by mouth daily.   Yes Historical Provider, MD  ramipril (ALTACE) 5 MG capsule Take 5 mg by mouth daily.   Yes Historical Provider, MD  rivaroxaban (XARELTO) 20 MG TABS tablet Take 20 mg by mouth daily with supper.   Yes Historical Provider, MD  triamterene-hydrochlorothiazide (DYAZIDE) 37.5-25 MG capsule Take 1 capsule by mouth daily.   Yes Historical Provider, MD      PHYSICAL EXAMINATION:    VITAL SIGNS: Blood pressure 143/88, pulse 104, temperature 97.8 F (36.6 C), temperature source Oral, resp. rate 18, height  (1.727 m), weight 65.772 kg (145 lb), SpO2 89 %.  GENERAL:  81 y.o.-year-old patient lying in the bed with no acute distress.  EYES: Pupils equal, round, reactive to light and accommodation. No scleral icterus. Extraocular muscles intact.  HEENT: Head atraumatic, normocephalic. Oropharynx and nasopharynx clear.  NECK:  Supple, no jugular venous distention. No thyroid enlargement, no tenderness.  LUNGS: Normal breath sounds bilaterally, no wheezing, rales,rhonchi or crepitation. No use of accessory muscles of respiration.  CARDIOVASCULAR: S1, S2 normal. No murmurs, rubs, or gallops.  ABDOMEN: Soft, mild lower abdomen, nondistended. Bowel sounds present. No organomegaly or mass. Foley catheter in place with reddish colored urine. EXTREMITIES: No pedal edema, cyanosis, or clubbing.  NEUROLOGIC: Cranial nerves II through XII are intact. Muscle strength 5/5 in right side extremities, have atrophy on the left upper extremity and is not able to move, and left lower extremity is 3 out of 5 with some muscle atrophy. Sensation intact. Gait not checked.  PSYCHIATRIC: The patient is alert and oriented x 3.  SKIN: No obvious rash, lesion, or ulcer.   LABORATORY PANEL:   CBC  Recent Labs Lab 02/25/15 1352 03/01/15 1154  WBC 11.5* 9.9  HGB 9.0* 9.1*  HCT 27.8* 27.3*  PLT 304 296  MCV 90.1 89.6  MCH 29.1 29.8  MCHC 32.2 33.3  RDW 15.0* 15.9*  LYMPHSABS 1.0  --   MONOABS 0.8  --   EOSABS 0.1  --   BASOSABS 0.0  --    ------------------------------------------------------------------------------------------------------------------  Chemistries   Recent Labs Lab 02/25/15 1352 03/01/15 1154  NA 140 140  K 4.2 5.0  CL 105 105  CO2 26 22  GLUCOSE 128* 92  BUN 41* 65*  CREATININE 1.55* 4.98*  CALCIUM 8.6* 8.8*    ------------------------------------------------------------------------------------------------------------------ estimated creatinine clearance is 10.1 mL/min (by C-G formula based on Cr of 4.98). ------------------------------------------------------------------------------------------------------------------ No results for input(s): TSH, T4TOTAL, T3FREE, THYROIDAB in the last 72 hours.  Invalid input(s): FREET3   Coagulation profile No results for input(s): INR, PROTIME in the last 168 hours. ------------------------------------------------------------------------------------------------------------------- No results for input(s): DDIMER in the last 72 hours. -------------------------------------------------------------------------------------------------------------------  Cardiac Enzymes No results for input(s): CKMB, TROPONINI, MYOGLOBIN in the last 168 hours.  Invalid input(s): CK ------------------------------------------------------------------------------------------------------------------ Invalid input(s): POCBNP  ---------------------------------------------------------------------------------------------------------------  Urinalysis    Component Value Date/Time   COLORURINE AMBER* 03/01/2015 1153   APPEARANCEUR CLOUDY* 03/01/2015 1153   LABSPEC 1.015 03/01/2015 1153   PHURINE 5.0 03/01/2015 1153   GLUCOSEU NEGATIVE 03/01/2015 1153   HGBUR 3+* 03/01/2015 1153   BILIRUBINUR NEGATIVE 03/01/2015 1153   KETONESUR TRACE* 03/01/2015 1153   PROTEINUR 100* 03/01/2015 1153   NITRITE NEGATIVE 03/01/2015 1153   LEUKOCYTESUR 3+* 03/01/2015 1153     RADIOLOGY: No results found.  IMPRESSION AND PLAN:  * UTI with acute urinary retention and hematuria  His urine is sent for culture. Started on IV Rocephin.  His hematuria is possibly secondary to Xarelto, I will hold it for now.  Follow his serial hemoglobin and will transfuse if required.  He had some urinary  retention issues before and now with this UTI he had acute retention possibly there might be some underlying prostate issues.  I will call urologic consult.  * Acute renal failure with hyperkalemia  Most likely this is postop instruction, give IV fluid and monitor.  Get ultrasound renal to rule out any other source of renal failure.  Potassium is still 5 so does not need any treatment for now but will continue monitoring.  * Atrial fibrillation  Continue his rate controlling medications for now but will hold her Xarelto because of hematuria.  * Hypertension  Continue home medications.  * History of stroke with left-sided hemiplegia  The patient is able to mobilize himself with use of support.  We'll hold aspirin for now because of hematuria.  All the records are reviewed and case discussed with ED provider. Management plans discussed with the patient, family and they are in agreement.  CODE STATUS: DO NOT RESUSCITATE    TOTAL TIME TAKING CARE OF THIS PATIENT: 50 minutes.   Discussed with patient's wife and daughter-in-law were present in the room.  Altamese DillingVACHHANI, Illya Gienger M.D on 03/01/2015   Between 7am to 6pm - Pager - [phone]  After 6pm go to [email or web address] - password [email or web address] [facility]  [facility]  Office  [phone]  CC: Primary care physician; Luna FuseEJAN-SIE, SHEIKH AHMED, MD   Note: This dictation was prepared with Dragon dictation along with smaller phrase technology. Any transcriptional errors that result from this process are unintentional.

## 2015-03-02 DIAGNOSIS — N401 Enlarged prostate with lower urinary tract symptoms: Secondary | ICD-10-CM

## 2015-03-02 DIAGNOSIS — R338 Other retention of urine: Secondary | ICD-10-CM

## 2015-03-02 LAB — CBC
HEMATOCRIT: 26.5 % — AB (ref 40.0–52.0)
HEMOGLOBIN: 8.5 g/dL — AB (ref 13.0–18.0)
MCH: 29 pg (ref 26.0–34.0)
MCHC: 32 g/dL (ref 32.0–36.0)
MCV: 90.8 fL (ref 80.0–100.0)
PLATELETS: 284 10*3/uL (ref 150–440)
RBC: 2.92 MIL/uL — ABNORMAL LOW (ref 4.40–5.90)
RDW: 15.8 % — AB (ref 11.5–14.5)
WBC: 10.8 10*3/uL — ABNORMAL HIGH (ref 3.8–10.6)

## 2015-03-02 LAB — BASIC METABOLIC PANEL
Anion gap: 12 (ref 5–15)
BUN: 57 mg/dL — AB (ref 6–20)
CALCIUM: 8.5 mg/dL — AB (ref 8.9–10.3)
CHLORIDE: 109 mmol/L (ref 101–111)
CO2: 21 mmol/L — ABNORMAL LOW (ref 22–32)
CREATININE: 3.4 mg/dL — AB (ref 0.61–1.24)
GFR calc non Af Amer: 15 mL/min — ABNORMAL LOW (ref >60)
GFR, EST AFRICAN AMERICAN: 18 mL/min — AB (ref >60)
Glucose, Bld: 68 mg/dL (ref 65–99)
Potassium: 4.5 mmol/L (ref 3.5–5.1)
SODIUM: 142 mmol/L (ref 135–145)

## 2015-03-02 LAB — HEMOGLOBIN: Hemoglobin: 7.6 g/dL — ABNORMAL LOW (ref 13.0–18.0)

## 2015-03-02 MED ORDER — DEXTROSE 5 % IV SOLN
1.0000 g | INTRAVENOUS | Status: DC
  Administered 2015-03-03: 1 g via INTRAVENOUS
  Filled 2015-03-02 (×3): qty 10

## 2015-03-02 MED ORDER — TERAZOSIN HCL 2 MG PO CAPS
2.0000 mg | ORAL_CAPSULE | Freq: Every day | ORAL | Status: DC
  Administered 2015-03-02 – 2015-03-03 (×2): 2 mg via ORAL
  Filled 2015-03-02 (×3): qty 1

## 2015-03-02 MED ORDER — DEXTROSE 5 % IV SOLN
1.0000 g | Freq: Once | INTRAVENOUS | Status: AC
  Administered 2015-03-02: 10:00:00 1 g via INTRAVENOUS
  Filled 2015-03-02: qty 10

## 2015-03-02 MED ORDER — HYDRALAZINE HCL 20 MG/ML IJ SOLN: 10.0000 mg | Freq: Four times a day (QID) | INTRAMUSCULAR | Status: DC | PRN

## 2015-03-02 MED ORDER — TAMSULOSIN HCL 0.4 MG PO CAPS
0.4000 mg | ORAL_CAPSULE | Freq: Every day | ORAL | Status: DC
  Administered 2015-03-02 – 2015-03-04 (×3): 0.4 mg via ORAL
  Filled 2015-03-02 (×3): qty 1

## 2015-03-02 NOTE — Progress Notes (Signed)
Pt is alert and oriented, denies pain, history of cva and needs assist with mobility. Foley in place, bun and creatinine trending down, on IV fluids and IV antibiotics, using bedban, several loose stools throughout shift, on isolation precautions to rule out c. Diff, on room air, vital signs stable, nephro and urology consulted with patient, pt started on Wellbutrin. Wife visiting at bedside. Hgb improving.Uneventful shift.

## 2015-03-02 NOTE — Progress Notes (Signed)
Per Dr. Eliane DecreeS Patel okay to check for c. Diff due to multiple loose bm, telephone conversation

## 2015-03-02 NOTE — Progress Notes (Signed)
Notified Dr Clint GuyHower of current hgb 7.6 and the next one will be in the morning. No new orders given. Will continue to monitor.Geri Seminolerusilla A Jackson, RN

## 2015-03-02 NOTE — Progress Notes (Signed)
[facility]  ROUNDING NOTE   Subjective:   Patient presented with no ability to urinate. Went to ED where he was found to have acute renal failure. Foley catheter placed and patient found to have obstructive uropathy.   Creatinine 3.4 (4.98) baseline 1.78 (10/20/2014)  Wife at bedside  Objective:  Vital signs in last 24 hours:  Temp:  [97.7 F (36.5 C)-98.3 F (36.8 C)] 97.9 F (36.6 C) (12/06 1354) Pulse Rate:  [69-104] 92 (12/06 1354) Resp:  [17-20] 20 (12/06 1354) BP: (100-143)/(48-97) 101/59 mmHg (12/06 1354) SpO2:  [89 %-100 %] 100 % (12/06 1354) Weight:  [54.069 kg (119 lb 3.2 oz)] 54.069 kg (119 lb 3.2 oz) (12/05 1741)  Weight change:  Filed Weights   03/01/15 1116 03/01/15 1741  Weight: 65.772 kg (145 lb) 54.069 kg (119 lb 3.2 oz)    Intake/Output: I/O last 3 completed shifts: In: 457.5 [I.V.:457.5] Out: 1425 [Urine:1425]   Intake/Output this shift:  Total I/O In: 290 [P.O.:240; IV Piggyback:50] Out: 600 [Urine:600]  Physical Exam: General: NAD, laying in bed  Head: Normocephalic, atraumatic. Dry oral mucosal membranes  Eyes: Anicteric, PERRL  Neck: Supple, trachea midline  Lungs:  Clear to auscultation  Heart: Regular rate and rhythm  Abdomen:  Soft, nontender,   Extremities:  no peripheral edema.  Neurologic: Nonfocal, moving all four extremities  Skin: No lesions       Basic Metabolic Panel:  Recent Labs Lab 02/25/15 1352 03/01/15 1154 03/02/15 0616  NA 140 140 142  K 4.2 5.0 4.5  CL 105 105 109  CO2 26 22 21*  GLUCOSE 128* 92 68  BUN 41* 65* 57*  CREATININE 1.55* 4.98* 3.40*  CALCIUM 8.6* 8.8* 8.5*    Liver Function Tests: No results for input(s): AST, ALT, ALKPHOS, BILITOT, PROT, ALBUMIN in the last 168 hours. No results for input(s): LIPASE, AMYLASE in the last 168 hours. No results for input(s): AMMONIA in the last 168 hours.  CBC:  Recent Labs Lab 02/25/15 1352 03/01/15 1154 03/01/15 1756 03/01/15 2326  03/02/15 0616  WBC 11.5* 9.9  --   --  10.8*  NEUTROABS 9.6*  --   --   --   --   HGB 9.0* 9.1* 8.1* 7.6* 8.5*  HCT 27.8* 27.3*  --   --  26.5*  MCV 90.1 89.6  --   --  90.8  PLT 304 296  --   --  284    Cardiac Enzymes: No results for input(s): CKTOTAL, CKMB, CKMBINDEX, TROPONINI in the last 168 hours.  BNP: Invalid input(s): POCBNP  CBG: No results for input(s): GLUCAP in the last 168 hours.  Microbiology: Results for orders placed or performed during the hospital encounter of 03/01/15  Urine culture     Status: None (Preliminary result)   Collection Time: 03/01/15 11:53 AM  Result Value Ref Range Status   Specimen Description URINE, RANDOM  Final   Special Requests NONE  Final   Culture NO GROWTH < 24 HOURS  Final   Report Status PENDING  Incomplete    Coagulation Studies: No results for input(s): LABPROT, INR in the last 72 hours.  Urinalysis:  Recent Labs  03/01/15 1153  COLORURINE AMBER*  LABSPEC 1.015  PHURINE 5.0  GLUCOSEU NEGATIVE  HGBUR 3+*  BILIRUBINUR NEGATIVE  KETONESUR TRACE*  PROTEINUR 100*  NITRITE NEGATIVE  LEUKOCYTESUR 3+*      Imaging: US Renal  03/01/2015  CLINICAL DATA:  Urinary retention EXAM: RENAL / URINARY TRACT  ULTRASOUND COMPLETE COMPARISON:  None. FINDINGS: Right Kidney: Length: 12 cm. Multiple cysts, simple appearing and measuring up to 6 cm. The cortex is diffusely echogenic and smoothly thin. No hydronephrosis. Left Kidney: Length: 12.6 Cm. Multiple simple appearing cysts, the largest measuring 7 cm. The cortex is echogenic and smoothly thinned. No hydronephrosis. Bladder: Successfully decompressed around a Foley catheter, limiting further evaluation. IMPRESSION: 1. No hydronephrosis. The bladder is decompressed by Foley catheter. 2. Chronic medical renal disease with simple cysts. Electronically Signed   By: Monte Fantasia M.D.   On: 03/01/2015 16:41     Medications:   . sodium chloride 75 mL/hr at 03/02/15 0806   .  atorvastatin  80 mg Oral QPM  . cefTRIAXone (ROCEPHIN)  IV  1 g Intravenous Q24H  . diltiazem  120 mg Oral QPM  . docusate sodium  200 mg Oral BID  . lubiprostone  24 mcg Oral Daily  . metoprolol  100 mg Oral BID  . multivitamin with minerals  1 tablet Oral Daily  . tamsulosin  0.4 mg Oral Daily  . terazosin  2 mg Oral QHS   hydrALAZINE  Assessment/ Plan:  [name] is a 81 y.o. black  male with long-standing hypertension, atrial fibrillation, chronic anticoagulation, stroke with left arm and left leg weakness admitted on 03/01/2015 for Urinary retention [R33.9] Acute renal injury ([place]) [N17.9] Retention of urine [R33.9]  1. Acute Renal failure on chronic kidney disease stage III: Baseline creatinine of 1.78, eGFR of 39 from 10/20/2014. Chronic Kidney disease is secondary to hypertension. No history of proteinuria.  Acute renal failure from obstructive uropathy. Now foley catheter placed and normal saline at 75.  Creatinine trending downward.  Not currently on an ACE-I /ARB at home.  Continue to hold diuretics.   2. Hypertension: well controlled. - continue terazosin - new medicaiton - metoprolol, hydralazine, diltiazem  3. Urinary Tract infection: empiric ceftriaxone.  Appreciate urology input.      LOS: 1 Jordan Kline 12/6/20162:40 PM

## 2015-03-02 NOTE — Plan of Care (Addendum)
Problem: Education: Goal: Knowledge of treatment and prevention of UTI/Pyleonephritis will improve Outcome: Progressing Pt alert and oriented x 4. Pt with knowledge of medical dx. Discussed the risk of infection further with patient during foley catheter care.   Problem: Urinary Elimination: Goal: Signs and symptoms of infection will decrease Outcome: Progressing Foley care provided. Pt verbalized understanding of the risk for infection. Pt afebrile this shift. AM labs pending. Hand hygiene encouraged.        Pt had some episodes of brown watery stools. He received 200 mg of Colace at bedtime. Will let oncoming nurse know that Colace may need to be held r/t diarrhea. Pt with no complaints of pain just some mild discomfort from foley catheter. Cloudy/red urine to foley. CBC ordered for q 6 hours. Last result was 7.6. Prior result was 8.1. Dr Clint GuyHower notified. No new orders were given at that time. Will continue to monitor.

## 2015-03-02 NOTE — Consult Note (Signed)
I have been asked to see the patient by Dr. Altamese DillingVachhani Vaibhavkumar, for evaluation and management of urinary retention.  History of present illness: 51M with signficant comorbidities found who presented to the emergency department 3 days prior to his current admission and was treated for urinary tract infection despite no culture being sent. He was given Keflex 500 mg 3 times daily. He presented 3 days later with lower abdominal pain and incomplete bladder emptying. He was noted to have urinary retention with Foley catheter is place an approximate 600 mL of blood-tinged urine drained. Baseline, the patient has voiding symptoms including weak stream, incomplete bladder emptying, urinary frequency, and nocturia. He is not seen by a urologist and is not taking any medications for this.  Review of systems: A 12 point comprehensive review of systems was obtained and is negative unless otherwise stated in the history of present illness.  Patient Active Problem List   Diagnosis Date Noted  . UTI (lower urinary tract infection) 03/01/2015  . Urinary retention 03/01/2015    No current facility-administered medications on file prior to encounter.   Current Outpatient Prescriptions on File Prior to Encounter  Medication Sig Dispense Refill  . atorvastatin (LIPITOR) 80 MG tablet Take 80 mg by mouth every evening.     . cephALEXin (KEFLEX) 500 MG capsule Take 1 capsule (500 mg total) by mouth 3 (three) times daily. 30 capsule 0  . docusate sodium (COLACE) 100 MG capsule Take 200 mg by mouth 2 (two) times daily.    Marland Kitchen. lubiprostone (AMITIZA) 24 MCG capsule Take 24 mcg by mouth daily.     . ramipril (ALTACE) 5 MG capsule Take 5 mg by mouth daily.    . rivaroxaban (XARELTO) 20 MG TABS tablet Take 20 mg by mouth daily with supper.      Past Medical History  Diagnosis Date  . Stroke (HCC)   . Hypertension   . Chronic kidney disease (CKD)   . High cholesterol   . A-fib (HCC)   . Inguinal hernia     Past  Surgical History  Procedure Laterality Date  . Appendectomy      Social History  Substance Use Topics  . Smoking status: Never Smoker   . Smokeless tobacco: None  . Alcohol Use: No    Family History  Problem Relation Age of Onset  . Hypertension Father     PE: Filed Vitals:   03/01/15 1741 03/01/15 2045 03/01/15 2212 03/02/15 0400  BP: 125/90 100/48 112/67 128/79  Pulse: 69 89 90 87  Temp: 98.1 F (36.7 C) 98.3 F (36.8 C)  97.7 F (36.5 C)  TempSrc: Oral Oral  Oral  Resp:      Height: 5\' 8"  (1.727 m)     Weight: 119 lb 3.2 oz (54.069 kg)     SpO2: 93% 99%  100%   Patient appears to be in no acute distress  patient is alert and oriented x3 Atraumatic normocephalic head No cervical or supraclavicular lymphadenopathy appreciated No increased work of breathing, no audible wheezes/rhonchi Regular sinus rhythm/rate Abdomen is soft, nontender, nondistended, no CVA or suprapubic tenderness Lower extremities are symmetric without appreciable edema Grossly neurologically intact No identifiable skin lesions   Recent Labs  03/01/15 1154 03/01/15 1756 03/01/15 2326 03/02/15 0616  WBC 9.9  --   --  10.8*  HGB 9.1* 8.1* 7.6* 8.5*  HCT 27.3*  --   --  26.5*    Recent Labs  03/01/15 1154 03/02/15 0616  NA  140 142  K 5.0 4.5  CL 105 109  CO2 22 21*  GLUCOSE 92 68  BUN 65* 57*  CREATININE 4.98* 3.40*  CALCIUM 8.8* 8.5*   No results for input(s): LABPT, INR in the last 72 hours. No results for input(s): LABURIN in the last 72 hours. Results for orders placed or performed during the hospital encounter of 03/01/15  Urine culture     Status: None (Preliminary result)   Collection Time: 03/01/15 11:53 AM  Result Value Ref Range Status   Specimen Description URINE, RANDOM  Final   Special Requests NONE  Final   Culture NO GROWTH < 24 HOURS  Final   Report Status PENDING  Incomplete    Imaging: none  Imp: Urinary retention likely multifactorial including  long-standing bladder outlet obstruction from enlarged prostate.   Recommendations: Would recommend that the patient be discharged home with a Foley catheter and follow up with urology in 1 week for voiding trial. With that the patient on Flomax 0.4 mg daily if tolerated.  Patient's urine culture is negative. Would complete a seven-day course of antibiotics from his initial start date which was prior to him presenting to the emergency room and then stop the antibiotics.  I will get this scheduled for the patient, and sign off.    Berniece Salines W

## 2015-03-02 NOTE — Progress Notes (Signed)
[facility] - [place] at [facility]                                                                                                                                                                                            Patient Demographics   Jordan Kline, is a 81 y.o. male, DOB - October 15, 1927, ZOX:[id number]  Admit date - 03/01/2015   Admitting Physician Altamese Dilling, MD  Outpatient Primary MD for the patient is TEJAN-SIE, Carlene Coria, MD   LOS - 1  Subjective: Patient feeling better renal function improved, denies any other complaints     Review of Systems:   CONSTITUTIONAL: No documented fever. No fatigue, weakness. No weight gain, no weight loss.  EYES: No blurry or double vision.  ENT: No tinnitus. No postnasal drip. No redness of the oropharynx.  RESPIRATORY: No cough, no wheeze, no hemoptysis. No dyspnea.  CARDIOVASCULAR: No chest pain. No orthopnea. No palpitations. No syncope.  GASTROINTESTINAL: No nausea, no vomiting or diarrhea. No abdominal pain. No melena or hematochezia.  GENITOURINARY: No dysuria or hematuria.  ENDOCRINE: No polyuria or nocturia. No heat or cold intolerance.  HEMATOLOGY: No anemia. No bruising. No bleeding.  INTEGUMENTARY: No rashes. No lesions.  MUSCULOSKELETAL: No arthritis. No swelling. No gout.  NEUROLOGIC: No numbness, tingling, or ataxia. No seizure-type activity.  PSYCHIATRIC: No anxiety. No insomnia. No ADD.    Vitals:   Filed Vitals:   03/01/15 2045 03/01/15 2212 03/02/15 0400 03/02/15 1354  BP: 100/48 112/67 128/79 101/59  Pulse: 89 90 87 92  Temp: 98.3 F (36.8 C)  97.7 F (36.5 C) 97.9 F (36.6 C)  TempSrc: Oral  Oral Oral  Resp:    20  Height:      Weight:      SpO2: 99%  100% 100%    Wt Readings from Last 3 Encounters:  03/01/15 54.069 kg (119 lb 3.2 oz)  02/25/15 53.207 kg (117 lb 4.8 oz)     Intake/Output Summary (Last 24 hours) at 03/02/15 1409 Last data filed at 03/02/15  1100  Gross per 24 hour  Intake  747.5 ml  Output   1400 ml  Net -652.5 ml    Physical Exam:   GENERAL: Pleasant-appearing in no apparent distress.  HEAD, EYES, EARS, NOSE AND THROAT: Atraumatic, normocephalic. Extraocular muscles are intact. Pupils equal and reactive to light. Sclerae anicteric. No conjunctival injection. No oro-pharyngeal erythema.  NECK: Supple. There is no jugular venous distention. No bruits, no lymphadenopathy, no thyromegaly.  HEART: Regular rate and rhythm,. No murmurs, no rubs, no clicks.  LUNGS: Clear to auscultation bilaterally. No rales  or rhonchi. No wheezes.  ABDOMEN: Soft, flat, nontender, nondistended. Has good bowel sounds. No hepatosplenomegaly appreciated.  EXTREMITIES: No evidence of any cyanosis, clubbing, or peripheral edema.  +2 pedal and radial pulses bilaterally.  NEUROLOGIC: The patient is alert, awake, and oriented x3 with no focal motor or sensory deficits appreciated bilaterally.  SKIN: Moist and warm with no rashes appreciated.  Psych: Not anxious, depressed LN: No inguinal LN enlargement    Antibiotics   Anti-infectives    Start     Dose/Rate Route Frequency Ordered Stop   03/02/15 1300  cefTRIAXone (ROCEPHIN) 1 g in dextrose 5 % 50 mL IVPB     1 g 100 mL/hr over 30 Minutes Intravenous Every 24 hours 03/02/15 1255     03/02/15 0830  cefTRIAXone (ROCEPHIN) 1 g in dextrose 5 % 50 mL IVPB     1 g 100 mL/hr over 30 Minutes Intravenous  Once 03/02/15 0817 03/02/15 1056   03/01/15 1430  cefTRIAXone (ROCEPHIN) 1 g in dextrose 5 % 50 mL IVPB     1 g 100 mL/hr over 30 Minutes Intravenous  Once 03/01/15 1418 03/01/15 1657      Medications   Scheduled Meds: . atorvastatin  80 mg Oral QPM  . cefTRIAXone (ROCEPHIN)  IV  1 g Intravenous Q24H  . diltiazem  120 mg Oral QPM  . docusate sodium  200 mg Oral BID  . lubiprostone  24 mcg Oral Daily  . metoprolol  100 mg Oral BID  . multivitamin with minerals  1 tablet Oral Daily  . tamsulosin   0.4 mg Oral Daily  . terazosin  2 mg Oral QHS   Continuous Infusions: . sodium chloride 75 mL/hr at 03/02/15 0806   PRN Meds:.hydrALAZINE   Data Review:   Micro Results Recent Results (from the past 240 hour(s))  Urine culture     Status: None (Preliminary result)   Collection Time: 03/01/15 11:53 AM  Result Value Ref Range Status   Specimen Description URINE, RANDOM  Final   Special Requests NONE  Final   Culture NO GROWTH < 24 HOURS  Final   Report Status PENDING  Incomplete    Radiology Reports US Renal  03/01/2015  CLINICAL DATA:  Urinary retention EXAM: RENAL / URINARY TRACT ULTRASOUND COMPLETE COMPARISON:  None. FINDINGS: Right Kidney: Length: 12 cm. Multiple cysts, simple appearing and measuring up to 6 cm. The cortex is diffusely echogenic and smoothly thin. No hydronephrosis. Left Kidney: Length: 12.6 Cm. Multiple simple appearing cysts, the largest measuring 7 cm. The cortex is echogenic and smoothly thinned. No hydronephrosis. Bladder: Successfully decompressed around a Foley catheter, limiting further evaluation. IMPRESSION: 1. No hydronephrosis. The bladder is decompressed by Foley catheter. 2. Chronic medical renal disease with simple cysts. Electronically Signed   By: Marnee Spring M.D.   On: 03/01/2015 16:41     CBC  Recent Labs Lab 02/25/15 1352 03/01/15 1154 03/01/15 1756 03/01/15 2326 03/02/15 0616  WBC 11.5* 9.9  --   --  10.8*  HGB 9.0* 9.1* 8.1* 7.6* 8.5*  HCT 27.8* 27.3*  --   --  26.5*  PLT 304 296  --   --  284  MCV 90.1 89.6  --   --  90.8  MCH 29.1 29.8  --   --  29.0  MCHC 32.2 33.3  --   --  32.0  RDW 15.0* 15.9*  --   --  15.8*  LYMPHSABS 1.0  --   --   --   --  MONOABS 0.8  --   --   --   --   EOSABS 0.1  --   --   --   --   BASOSABS 0.0  --   --   --   --     Chemistries   Recent Labs Lab 02/25/15 1352 03/01/15 1154 03/02/15 0616  NA 140 140 142  K 4.2 5.0 4.5  CL 105 105 109  CO2 26 22 21*  GLUCOSE 128* 92 68  BUN 41*  65* 57*  CREATININE 1.55* 4.98* 3.40*  CALCIUM 8.6* 8.8* 8.5*   ------------------------------------------------------------------------------------------------------------------ estimated creatinine clearance is 12.2 mL/min (by C-G formula based on Cr of 3.4). ------------------------------------------------------------------------------------------------------------------ No results for input(s): HGBA1C in the last 72 hours. ------------------------------------------------------------------------------------------------------------------ No results for input(s): CHOL, HDL, LDLCALC, TRIG, CHOLHDL, LDLDIRECT in the last 72 hours. ------------------------------------------------------------------------------------------------------------------ No results for input(s): TSH, T4TOTAL, T3FREE, THYROIDAB in the last 72 hours.  Invalid input(s): FREET3 ------------------------------------------------------------------------------------------------------------------ No results for input(s): VITAMINB12, FOLATE, FERRITIN, TIBC, IRON, RETICCTPCT in the last 72 hours.  Coagulation profile No results for input(s): INR, PROTIME in the last 168 hours.  No results for input(s): DDIMER in the last 72 hours.  Cardiac Enzymes No results for input(s): CKMB, TROPONINI, MYOGLOBIN in the last 168 hours.  Invalid input(s): CK ------------------------------------------------------------------------------------------------------------------ Invalid input(s): POCBNP    Assessment & Plan   * UTI with acute urinary retention and hematuria Continue ceftriaxone and await urine cultures Urology input pending May be due to BPH Start patient on Flomax and Hytrin Xarelto on hold due to hematuria   * Acute renal failure with hyperkalemia Due to post obstructive, continue Foley renal function improved Avoid nephrotoxins Discontinue ACE inhibitor and HCTZ   * Atrial fibrillation Continue metoprolol,  Xarelto on hold  * Hypertension  stop HCTZ, ace inhibitor  * History of stroke with left-sided hemiplegia The patient is able to mobilize himself with use of support. We'll hold aspirin for now because of hematuria.  All the records are reviewed and case discussed with ED provider. Management plans discussed with the patient, family and they are in agreement.     Code Status Orders        Start     Ordered   03/01/15 1730  Do not attempt resuscitation (DNR)   Continuous    Question Answer Comment  In the event of cardiac or respiratory ARREST Do not call a "code blue"   In the event of cardiac or respiratory ARREST Do not perform Intubation, CPR, defibrillation or ACLS   In the event of cardiac or respiratory ARREST Use medication by any route, position, wound care, and other measures to relive pain and suffering. May use oxygen, suction and manual treatment of airway obstruction as needed for comfort.      03/01/15 1729    Advance Directive Documentation        Most Recent Value   Type of Advance Directive  Healthcare Power of Attorney, Living will   Pre-existing out of facility DNR order (yellow form or pink MOST form)     "MOST" Form in Place?             Consults urology  DVT Prophylaxis SCD  Lab Results  Component Value Date   PLT 284 03/02/2015     Time Spent in minutes  35 minutes Auburn BilberryPATEL, Jacolyn Joaquin M.D on 03/02/2015 at 2:09 PM  Between 7am to 6pm - Pager - [phone]  After 6pm go to [email or web address] - password [email or web address]  [facility]   Office  [phone]

## 2015-03-03 LAB — ABO/RH: ABO/RH(D): O POS

## 2015-03-03 LAB — IRON AND TIBC
Iron: 26 ug/dL — ABNORMAL LOW (ref 45–182)
SATURATION RATIOS: 10 % — AB (ref 17.9–39.5)
TIBC: 272 ug/dL (ref 250–450)
UIBC: 246 ug/dL

## 2015-03-03 LAB — URINE CULTURE: Culture: NO GROWTH

## 2015-03-03 LAB — CBC
HCT: 23.2 % — ABNORMAL LOW (ref 40.0–52.0)
HEMOGLOBIN: 7.5 g/dL — AB (ref 13.0–18.0)
MCH: 29.2 pg (ref 26.0–34.0)
MCHC: 32.5 g/dL (ref 32.0–36.0)
MCV: 89.8 fL (ref 80.0–100.0)
PLATELETS: 231 10*3/uL (ref 150–440)
RBC: 2.58 MIL/uL — AB (ref 4.40–5.90)
RDW: 15.6 % — ABNORMAL HIGH (ref 11.5–14.5)
WBC: 7.5 10*3/uL (ref 3.8–10.6)

## 2015-03-03 LAB — FERRITIN: FERRITIN: 30 ng/mL (ref 24–336)

## 2015-03-03 LAB — BASIC METABOLIC PANEL
ANION GAP: 8 (ref 5–15)
BUN: 47 mg/dL — ABNORMAL HIGH (ref 6–20)
CALCIUM: 8.2 mg/dL — AB (ref 8.9–10.3)
CO2: 20 mmol/L — ABNORMAL LOW (ref 22–32)
CREATININE: 2.49 mg/dL — AB (ref 0.61–1.24)
Chloride: 111 mmol/L (ref 101–111)
GFR, EST AFRICAN AMERICAN: 26 mL/min — AB (ref >60)
GFR, EST NON AFRICAN AMERICAN: 22 mL/min — AB (ref >60)
GLUCOSE: 92 mg/dL (ref 65–99)
Potassium: 4 mmol/L (ref 3.5–5.1)
Sodium: 139 mmol/L (ref 135–145)

## 2015-03-03 LAB — VITAMIN B12: Vitamin B-12: 1594 pg/mL — ABNORMAL HIGH (ref 180–914)

## 2015-03-03 LAB — PREPARE RBC (CROSSMATCH)

## 2015-03-03 MED ORDER — SODIUM CHLORIDE 0.9 % IV SOLN
Freq: Once | INTRAVENOUS | Status: AC
  Administered 2015-03-03: 17:00:00 via INTRAVENOUS

## 2015-03-03 NOTE — Plan of Care (Signed)
Problem: Education: Goal: Knowledge of [facility] Education information/materials will improve Outcome: Progressing Educated pt about high fall risk precautions. Pt verbalized understanding and uses the call bell. Plan of care reviewed with pt .  Problem: Education: Goal: Knowledge of treatment and prevention of UTI/Pyleonephritis will improve Outcome: Progressing Foley continues per MD. Foley care performed. Educated pt about foley care, need reinforcement. Urine is cloudy yellow. No hematuria noted.  VSS. Hgb 7.4. 1xunit of blood initiated. No reaction noted post transfusion.  Problem: Urinary Elimination: Goal: Signs and symptoms of infection will decrease Outcome: Progressing As Above. IVF continues. PO fluids encouraged.

## 2015-03-03 NOTE — Progress Notes (Signed)
[facility]  ROUNDING NOTE   Subjective:   Wife and son at bedside.  Creatinine 2.49 (3.4) (4.98) baseline 1.78 (10/20/2014) NS at 66m/hr Wife and son at bedside  Objective:  Vital signs in last 24 hours:  Temp:  [97.9 F (36.6 C)-98.5 F (36.9 C)] 98.3 F (36.8 C) (12/07 0452) Pulse Rate:  [40-108] 108 (12/07 1038) Resp:  [18-20] 18 (12/07 1038) BP: (91-124)/(59-63) 124/63 mmHg (12/07 1038) SpO2:  [90 %-100 %] 100 % (12/07 1038)  Weight change:  Filed Weights   03/01/15 1116 03/01/15 1741  Weight: 65.772 kg (145 lb) 54.069 kg (119 lb 3.2 oz)    Intake/Output: I/O last 3 completed shifts: In: 1837.5 [P.O.:480; I.V.:1307.5; IV Piggyback:50] Out: 2200 [Urine:2200]   Intake/Output this shift:     Physical Exam: General: NAD, laying in bed  Head: Normocephalic, atraumatic. Dry oral mucosal membranes  Eyes: Anicteric, PERRL  Neck: Supple, trachea midline  Lungs:  Clear to auscultation  Heart: Regular rate and rhythm  Abdomen:  Soft, nontender,   Extremities:  no peripheral edema.  Neurologic: Nonfocal, moving all four extremities  Skin: No lesions  GU  foley    Basic Metabolic Panel:  Recent Labs Lab 02/25/15 1352 03/01/15 1154 03/02/15 0616 03/03/15 0558  NA 140 140 142 139  K 4.2 5.0 4.5 4.0  CL 105 105 109 111  CO2 26 22 21* 20*  GLUCOSE 128* 92 68 92  BUN 41* 65* 57* 47*  CREATININE 1.55* 4.98* 3.40* 2.49*  CALCIUM 8.6* 8.8* 8.5* 8.2*    Liver Function Tests: No results for input(s): AST, ALT, ALKPHOS, BILITOT, PROT, ALBUMIN in the last 168 hours. No results for input(s): LIPASE, AMYLASE in the last 168 hours. No results for input(s): AMMONIA in the last 168 hours.  CBC:  Recent Labs Lab 02/25/15 1352 03/01/15 1154 03/01/15 1756 03/01/15 2326 03/02/15 0616 03/03/15 0558  WBC 11.5* 9.9  --   --  10.8* 7.5  NEUTROABS 9.6*  --   --   --   --   --   HGB 9.0* 9.1* 8.1* 7.6* 8.5* 7.5*  HCT 27.8* 27.3*  --   --  26.5* 23.2*   MCV 90.1 89.6  --   --  90.8 89.8  PLT 304 296  --   --  284 231    Cardiac Enzymes: No results for input(s): CKTOTAL, CKMB, CKMBINDEX, TROPONINI in the last 168 hours.  BNP: Invalid input(s): POCBNP  CBG: No results for input(s): GLUCAP in the last 168 hours.  Microbiology: Results for orders placed or performed during the hospital encounter of 03/01/15  Urine culture     Status: None   Collection Time: 03/01/15 11:53 AM  Result Value Ref Range Status   Specimen Description URINE, RANDOM  Final   Special Requests NONE  Final   Culture NO GROWTH 2 DAYS  Final   Report Status 03/03/2015 FINAL  Final    Coagulation Studies: No results for input(s): LABPROT, INR in the last 72 hours.  Urinalysis:  Recent Labs  03/01/15 1153  COLORURINE AMBER*  LABSPEC 1.015  PHURINE 5.0  GLUCOSEU NEGATIVE  HGBUR 3+*  BILIRUBINUR NEGATIVE  KETONESUR TRACE*  PROTEINUR 100*  NITRITE NEGATIVE  LEUKOCYTESUR 3+*      Imaging: [place]  03/01/2015  CLINICAL DATA:  Urinary retention EXAM: RENAL / URINARY TRACT ULTRASOUND COMPLETE COMPARISON:  None. FINDINGS: Right Kidney: Length: 12 cm. Multiple cysts, simple appearing and measuring up to 6 cm. The  cortex is diffusely echogenic and smoothly thin. No hydronephrosis. Left Kidney: Length: 12.6 Cm. Multiple simple appearing cysts, the largest measuring 7 cm. The cortex is echogenic and smoothly thinned. No hydronephrosis. Bladder: Successfully decompressed around a Foley catheter, limiting further evaluation. IMPRESSION: 1. No hydronephrosis. The bladder is decompressed by Foley catheter. 2. Chronic medical renal disease with simple cysts. Electronically Signed   By: Monte Fantasia M.D.   On: 03/01/2015 16:41     Medications:   . sodium chloride 75 mL/hr at 03/02/15 0806   . atorvastatin  80 mg Oral QPM  . cefTRIAXone (ROCEPHIN)  IV  1 g Intravenous Q24H  . diltiazem  120 mg Oral QPM  . lubiprostone  24 mcg Oral Daily  . metoprolol  100  mg Oral BID  . multivitamin with minerals  1 tablet Oral Daily  . tamsulosin  0.4 mg Oral Daily  . terazosin  2 mg Oral QHS   hydrALAZINE  Assessment/ Plan:  [name] is a 81 y.o. black  male with long-standing hypertension, atrial fibrillation, chronic anticoagulation, stroke with left arm and left leg weakness admitted on 03/01/2015 for Urinary retention [R33.9] Acute renal injury ([place]) [N17.9] Retention of urine [R33.9]  1. Acute Renal failure on chronic kidney disease stage III: Baseline creatinine of 1.78, eGFR of 39 from 10/20/2014. Chronic Kidney disease is secondary to hypertension. No history of proteinuria.  Acute renal failure from obstructive uropathy. Now foley catheter placed - normal saline at 75. Some level of metabolic acidosis.   Creatinine trending downward. UOP improving Not currently on an ACE-I /ARB at home.  Continue to hold diuretics.   2. Hypertension: well controlled. - continue terazosin - new medication as per urology for BPH and obstruction - metoprolol, hydralazine, diltiazem  3. Urinary Tract infection: empiric ceftriaxone. Cultures with no growth.  Appreciate urology input.      LOS: [place], [place] 12/7/201611:27 AM

## 2015-03-03 NOTE — Plan of Care (Signed)
Problem: Education: Goal: Knowledge of treatment and prevention of UTI/Pyleonephritis will improve Outcome: Progressing Urinary catheter in place for retention. Foley care provided. Urologist note said that pt may have to go home with indwelling foley x 1 week and f/u in his office. Nephrology also consulted. BUN 47 and Cr 2.49. Pt has hx of CKD stage 3. Urine cloudy/amber. Pt on isolation to r/o cdiff. No stools this shift. Pt re-educated on isolation precautions. Pt with increased anxiety over being on isolation (" I'm afraid for my grandchildren ") and wanted something to make him have a bowel movement so a stool could be sent. This Clinical research associatewriter informed patient that we did not want his diarrhea to start again and that maybe the isolation could be discontinued if he did not have any more diarrhea. Pt said this relieved some of his anxiety. Pt did not sleep well due to anxiety and wanted to call his wife at 3:00 and 4:00 this morning. Hgb down to 7.5 this am. Pt hgb has been around 7.5 -8.5.   Problem: Urinary Elimination: Goal: Signs and symptoms of infection will decrease Outcome: Progressing Pt on Rocephin for UTI. Pt WBC count 7.5 this am was 10.8 yesterday.

## 2015-03-03 NOTE — Care Management Important Message (Signed)
Important Message  Patient Details  Name: Jordan Kline MRN: [id number] Date of Birth: [birth year]   Medicare Important Message Given:  Yes    Marily MemosLisa M Chantae Soo, RN 03/03/2015, 2:29 PM

## 2015-03-03 NOTE — Progress Notes (Signed)
[facility] -  at [facility]                                                                                                                                                                                            Patient Demographics   Jordan Kline, is a 81 y.o. male, DOB - [birth year], ZOX:[id number]  Admit date - 03/01/2015   Admitting Physician Altamese Dilling, MD  Outpatient Primary MD for the patient is TEJAN-SIE, Marland Mcalpine AHMED, MD   LOS - 2  Subjective:  Pt hemoglobin dropping down, renal fxn improved, feels week     Review of Systems:   CONSTITUTIONAL: No documented fever. No fatigue, Positive weakness. No weight gain, no weight loss.  EYES: No blurry or double vision.  ENT: No tinnitus. No postnasal drip. No redness of the oropharynx.  RESPIRATORY: No cough, no wheeze, no hemoptysis. No dyspnea.  CARDIOVASCULAR: No chest pain. No orthopnea. No palpitations. No syncope.  GASTROINTESTINAL: No nausea, no vomiting or diarrhea. No abdominal pain. No melena or hematochezia.  GENITOURINARY: No dysuria or hematuria.  ENDOCRINE: No polyuria or nocturia. No heat or cold intolerance.  HEMATOLOGY: No anemia. No bruising. No bleeding.  INTEGUMENTARY: No rashes. No lesions.  MUSCULOSKELETAL: No arthritis. No swelling. No gout.  NEUROLOGIC: No numbness, tingling, or ataxia. No seizure-type activity.  PSYCHIATRIC: No anxiety. No insomnia. No ADD.    Vitals:   Filed Vitals:   03/02/15 1354 03/02/15 2051 03/03/15 0452 03/03/15 1038  BP: 101/59 91/62 96/61  124/63  Pulse: 92 92 40 108  Temp: 97.9 F (36.6 C) 98.5 F (36.9 C) 98.3 F (36.8 C)   TempSrc: Oral Oral Oral   Resp: Height:      Weight:      SpO2: 100% 94% 90% 100%    Wt Readings from Last 3 Encounters:  03/01/15 54.069 kg (119 lb 3.2 oz)  02/25/15 53.207 kg (117 lb 4.8 oz)     Intake/Output Summary (Last 24 hours) at 03/03/15 1245 Last data filed at 03/03/15  0246  Gross per 24 hour  Intake    850 ml  Output    800 ml  Net     50 ml    Physical Exam:   GENERAL: Pleasant-appearing in no apparent distress.  HEAD, EYES, EARS, NOSE AND THROAT: Atraumatic, normocephalic. Extraocular muscles are intact. Pupils equal and reactive to light. Sclerae anicteric. No conjunctival injection. No oro-pharyngeal erythema.  NECK: Supple. There is no jugular venous distention. No bruits, no lymphadenopathy, no thyromegaly.  HEART: Regular rate and rhythm,. No murmurs, no rubs, no clicks.  LUNGS: Clear to auscultation bilaterally. No rales or rhonchi. No wheezes.  ABDOMEN: Soft, flat, nontender, nondistended. Has good bowel sounds. No hepatosplenomegaly appreciated.  EXTREMITIES: No evidence of any cyanosis, clubbing, or peripheral edema.  +2 pedal and radial pulses bilaterally.  NEUROLOGIC: The patient is alert, awake, and oriented x3 with no focal motor or sensory deficits appreciated bilaterally.  SKIN: Moist and warm with no rashes appreciated.  Psych: Not anxious, depressed LN: No inguinal LN enlargement    Antibiotics   Anti-infectives    Start     Dose/Rate Route Frequency Ordered Stop   03/02/15 1300  cefTRIAXone (ROCEPHIN) 1 g in dextrose 5 % 50 mL IVPB     1 g 100 mL/hr over 30 Minutes Intravenous Every 24 hours 03/02/15 1255     03/02/15 0830  cefTRIAXone (ROCEPHIN) 1 g in dextrose 5 % 50 mL IVPB     1 g 100 mL/hr over 30 Minutes Intravenous  Once 03/02/15 0817 03/02/15 1056   03/01/15 1430  cefTRIAXone (ROCEPHIN) 1 g in dextrose 5 % 50 mL IVPB     1 g 100 mL/hr over 30 Minutes Intravenous  Once 03/01/15 1418 03/01/15 1657      Medications   Scheduled Meds: . sodium chloride   Intravenous Once  . atorvastatin  80 mg Oral QPM  . cefTRIAXone (ROCEPHIN)  IV  1 g Intravenous Q24H  . diltiazem  120 mg Oral QPM  . lubiprostone  24 mcg Oral Daily  . metoprolol  100 mg Oral BID  . multivitamin with minerals  1 tablet Oral Daily  .  tamsulosin  0.4 mg Oral Daily  . terazosin  2 mg Oral QHS   Continuous Infusions: . sodium chloride 75 mL/hr at 03/02/15 0806   PRN Meds:.hydrALAZINE   Data Review:   Micro Results Recent Results (from the past 240 hour(s))  Urine culture     Status: None   Collection Time: 03/01/15 11:53 AM  Result Value Ref Range Status   Specimen Description URINE, RANDOM  Final   Special Requests NONE  Final   Culture NO GROWTH 2 DAYS  Final   Report Status 03/03/2015 FINAL  Final    Radiology Reports [facility]  03/01/2015  CLINICAL DATA:  Urinary retention EXAM: RENAL / URINARY TRACT ULTRASOUND COMPLETE COMPARISON:  None. FINDINGS: Right Kidney: Length: 12 cm. Multiple cysts, simple appearing and measuring up to 6 cm. The cortex is diffusely echogenic and smoothly thin. No hydronephrosis. Left Kidney: Length: 12.6 Cm. Multiple simple appearing cysts, the largest measuring 7 cm. The cortex is echogenic and smoothly thinned. No hydronephrosis. Bladder: Successfully decompressed around a Foley catheter, limiting further evaluation. IMPRESSION: 1. No hydronephrosis. The bladder is decompressed by Foley catheter. 2. Chronic medical renal disease with simple cysts. Electronically Signed   By: Marnee SpringJonathon  Watts M.D.   On: 03/01/2015 16:41     CBC  Recent Labs Lab 02/25/15 1352 03/01/15 1154 03/01/15 1756 03/01/15 2326 03/02/15 0616 03/03/15 0558  WBC 11.5* 9.9  --   --  10.8* 7.5  HGB 9.0* 9.1* 8.1* 7.6* 8.5* 7.5*  HCT 27.8* 27.3*  --   --  26.5* 23.2*  PLT 304 296  --   --  284 231  MCV 90.1 89.6  --   --  90.8 89.8  MCH 29.1 29.8  --   --  29.0 29.2  MCHC 32.2 33.3  --   --  32.0 32.5  RDW 15.0* 15.9*  --   --  15.8* 15.6*  LYMPHSABS 1.0  --   --   --   --   --   MONOABS 0.8  --   --   --   --   --   EOSABS 0.1  --   --   --   --   --   BASOSABS 0.0  --   --   --   --   --     Chemistries   Recent Labs Lab 02/25/15 1352 03/01/15 1154 03/02/15 0616 03/03/15 0558  NA 140 140 142  139  K 4.2 5.0 4.5 4.0  CL 105 105 109 111  CO2 26 22 21* 20*  GLUCOSE 128* 92 68 92  BUN 41* 65* 57* 47*  CREATININE 1.55* 4.98* 3.40* 2.49*  CALCIUM 8.6* 8.8* 8.5* 8.2*   ------------------------------------------------------------------------------------------------------------------ estimated creatinine clearance is 16.6 mL/min (by C-G formula based on Cr of 2.49). ------------------------------------------------------------------------------------------------------------------ No results for input(s): HGBA1C in the last 72 hours. ------------------------------------------------------------------------------------------------------------------ No results for input(s): CHOL, HDL, LDLCALC, TRIG, CHOLHDL, LDLDIRECT in the last 72 hours. ------------------------------------------------------------------------------------------------------------------ No results for input(s): TSH, T4TOTAL, T3FREE, THYROIDAB in the last 72 hours.  Invalid input(s): FREET3 ------------------------------------------------------------------------------------------------------------------ No results for input(s): VITAMINB12, FOLATE, FERRITIN, TIBC, IRON, RETICCTPCT in the last 72 hours.  Coagulation profile No results for input(s): INR, PROTIME in the last 168 hours.  No results for input(s): DDIMER in the last 72 hours.  Cardiac Enzymes No results for input(s): CKMB, TROPONINI, MYOGLOBIN in the last 168 hours.  Invalid input(s): CK ------------------------------------------------------------------------------------------------------------------ Invalid input(s): POCBNP    Assessment & Plan   * UTI with acute urinary retention and hematuria Continue ceftriaxone , urine cx neg so far, out pt cultures not avaliable Urology input appricated May be due to BPH Flomax and Hytrin Xarelto on hold due to hematuria Per urology full is to be continued as outpatient In follow-up as  outpatient  *Symptomatic anemia: Likely has anemia of chronic disease as well as hematuria and on top of that he was hydrated. We'll check iron and ferritin B12 folate patient consented for transfusion he is agreeable  * Acute renal failure with hyperkalemia Due to post obstructive, continue Foley renal function improved Avoid nephrotoxins Discontinue ACE inhibitor and HCTZ   * Atrial fibrillation Continue metoprolol, Xarelto on hold  * Hypertension  stop HCTZ, ace inhibitor  * History of stroke with left-sided hemiplegia  Aspirin on hold due to hematuria  All the records are reviewed and case discussed with ED provider. Management plans discussed with the patient, family and they are in agreement.     Code Status Orders        Start     Ordered   03/01/15 1730  Do not attempt resuscitation (DNR)   Continuous    Question Answer Comment  In the event of cardiac or respiratory ARREST Do not call a "code blue"   In the event of cardiac or respiratory ARREST Do not perform Intubation, CPR, defibrillation or ACLS   In the event of cardiac or respiratory ARREST Use medication by any route, position, wound care, and other measures to relive pain and suffering. May use oxygen, suction and manual treatment of airway obstruction as needed for comfort.      03/01/15 1729    Advance Directive Documentation        Most Recent Value   Type of Advance Directive  Healthcare Power of Attorney, Living will   Pre-existing out of facility DNR order (yellow form or  pink MOST form)     "MOST" Form in Place?             Consults urology  DVT Prophylaxis SCD  Lab Results  Component Value Date   PLT 231 03/03/2015     Time Spent in minutes  Auburn Bilberry M.D on 03/03/2015 at 12:45 PM  Between 7am to 6pm - Pager - [phone]  After 6pm go to [email or web address] - password [email or web address] [facility]  [facility]   Office  [phone]

## 2015-03-04 LAB — BASIC METABOLIC PANEL
ANION GAP: 7 (ref 5–15)
BUN: 34 mg/dL — ABNORMAL HIGH (ref 6–20)
CALCIUM: 8.3 mg/dL — AB (ref 8.9–10.3)
CO2: 22 mmol/L (ref 22–32)
CREATININE: 1.75 mg/dL — AB (ref 0.61–1.24)
Chloride: 114 mmol/L — ABNORMAL HIGH (ref 101–111)
GFR calc Af Amer: 39 mL/min — ABNORMAL LOW (ref >60)
GFR calc non Af Amer: 34 mL/min — ABNORMAL LOW (ref >60)
Glucose, Bld: 75 mg/dL (ref 65–99)
Potassium: 4.3 mmol/L (ref 3.5–5.1)
Sodium: 143 mmol/L (ref 135–145)

## 2015-03-04 LAB — TYPE AND SCREEN
ABO/RH(D): O POS
ANTIBODY SCREEN: NEGATIVE
Unit division: 0

## 2015-03-04 LAB — CBC
HEMATOCRIT: 26.9 % — AB (ref 40.0–52.0)
Hemoglobin: 8.7 g/dL — ABNORMAL LOW (ref 13.0–18.0)
MCH: 29.4 pg (ref 26.0–34.0)
MCHC: 32.4 g/dL (ref 32.0–36.0)
MCV: 90.6 fL (ref 80.0–100.0)
Platelets: 212 10*3/uL (ref 150–440)
RBC: 2.97 MIL/uL — ABNORMAL LOW (ref 4.40–5.90)
RDW: 15.8 % — AB (ref 11.5–14.5)
WBC: 8 10*3/uL (ref 3.8–10.6)

## 2015-03-04 MED ORDER — CEPHALEXIN 500 MG PO CAPS: 500.0000 mg | ORAL_CAPSULE | Freq: Three times a day (TID) | ORAL | Status: DC

## 2015-03-04 MED ORDER — TAMSULOSIN HCL 0.4 MG PO CAPS: 0.4000 mg | ORAL_CAPSULE | Freq: Every day | ORAL | Status: DC

## 2015-03-04 MED ORDER — FERROUS SULFATE 325 (65 FE) MG PO TABS: 325.0000 mg | ORAL_TABLET | Freq: Two times a day (BID) | ORAL | Status: AC

## 2015-03-04 MED ORDER — FERROUS SULFATE 325 (65 FE) MG PO TABS: 325.0000 mg | ORAL_TABLET | Freq: Two times a day (BID) | ORAL | Status: DC

## 2015-03-04 MED ORDER — TERAZOSIN HCL 2 MG PO CAPS: 2.0000 mg | ORAL_CAPSULE | Freq: Every day | ORAL | Status: DC

## 2015-03-04 NOTE — Plan of Care (Signed)
Problem: Education: Goal: Knowledge of [facility] Education information/materials will improve Outcome: Progressing Pt forgetful at times. Re-educated on utilizing the call system (pt doesn't understand the Ascom sx). Pt still continues to speak out for help. High fall risk but not impulsive.   Problem: Education: Goal: Knowledge of treatment and prevention of UTI/Pyleonephritis will improve Outcome: Progressing Pt given verbal instruction on foley care during hygiene care. Per urologist note, pt will probably be discharged with foley and f/u outpatient. He received one unit of prbc's which he tolerated well. No complaints of pain. Next cbc pending for this am. WBC within normal limits. Pt afebrile this shift. Will continue to monitor.   Problem: Urinary Elimination: Goal: Signs and symptoms of infection will decrease Outcome: Progressing See above.

## 2015-03-04 NOTE — Progress Notes (Signed)
Pt d/c home via wheelchair escorted by staff and family  

## 2015-03-04 NOTE — Progress Notes (Signed)
[facility]  ROUNDING NOTE   Subjective:   Wife and son at bedside.  Creatinine 1.75 (2.49) (3.4) (4.98) baseline 1.78 (10/20/2014) UOP 1979  Objective:  Vital signs in last 24 hours:  Temp:  [97.3 F (36.3 C)-98.2 F (36.8 C)] 97.3 F (36.3 C) (12/08 1304) Pulse Rate:  [64-99] 86 (12/08 1304) Resp:  [17-20] 19 (12/08 1304) BP: (111-137)/(53-82) 134/80 mmHg (12/08 1304) SpO2:  [98 %-100 %] 100 % (12/08 1304)  Weight change:  Filed Weights   03/01/15 1116 03/01/15 1741  Weight: 65.772 kg (145 lb) 54.069 kg (119 lb 3.2 oz)    Intake/Output: I/O last 3 completed shifts: In: 1979 [P.O.:240; I.V.:1354; Blood:335; IV Piggyback:50] Out: 800 [Urine:800]   Intake/Output this shift:  Total I/O In: 240 [P.O.:240] Out: -   Physical Exam: General: NAD, laying in bed  Head: Normocephalic, atraumatic. Dry oral mucosal membranes  Eyes: Anicteric, PERRL  Neck: Supple, trachea midline  Lungs:  Clear to auscultation  Heart: Regular rate and rhythm  Abdomen:  Soft, nontender,   Extremities:  no peripheral edema.  Neurologic: Nonfocal, moving all four extremities  Skin: No lesions  GU  foley    Basic Metabolic Panel:  Recent Labs Lab 02/25/15 1352 03/01/15 1154 03/02/15 0616 03/03/15 0558 03/04/15 0730  NA 140 140 142 139 143  K 4.2 5.0 4.5 4.0 4.3  CL 105 105 109 111 114*  CO2 26 22 21* 20* 22  GLUCOSE 128* 92 68 92 75  BUN 41* 65* 57* 47* 34*  CREATININE 1.55* 4.98* 3.40* 2.49* 1.75*  CALCIUM 8.6* 8.8* 8.5* 8.2* 8.3*    Liver Function Tests: No results for input(s): AST, ALT, ALKPHOS, BILITOT, PROT, ALBUMIN in the last 168 hours. No results for input(s): LIPASE, AMYLASE in the last 168 hours. No results for input(s): AMMONIA in the last 168 hours.  CBC:  Recent Labs Lab 02/25/15 1352 03/01/15 1154 03/01/15 1756 03/01/15 2326 03/02/15 0616 03/03/15 0558 03/04/15 0730  WBC 11.5* 9.9  --   --  10.8* 7.5 8.0  NEUTROABS 9.6*  --   --   --    --   --   --   HGB 9.0* 9.1* 8.1* 7.6* 8.5* 7.5* 8.7*  HCT 27.8* 27.3*  --   --  26.5* 23.2* 26.9*  MCV 90.1 89.6  --   --  90.8 89.8 90.6  PLT 304 296  --   --  284 231 212    Cardiac Enzymes: No results for input(s): CKTOTAL, CKMB, CKMBINDEX, TROPONINI in the last 168 hours.  BNP: Invalid input(s): POCBNP  CBG: No results for input(s): GLUCAP in the last 168 hours.  Microbiology: Results for orders placed or performed during the hospital encounter of 03/01/15  Urine culture     Status: None   Collection Time: 03/01/15 11:53 AM  Result Value Ref Range Status   Specimen Description URINE, RANDOM  Final   Special Requests NONE  Final   Culture NO GROWTH 2 DAYS  Final   Report Status 03/03/2015 FINAL  Final    Coagulation Studies: No results for input(s): LABPROT, INR in the last 72 hours.  Urinalysis: No results for input(s): COLORURINE, LABSPEC, PHURINE, GLUCOSEU, HGBUR, BILIRUBINUR, KETONESUR, PROTEINUR, UROBILINOGEN, NITRITE, LEUKOCYTESUR in the last 72 hours.  Invalid input(s): APPERANCEUR    Imaging: No results found.   Medications:   . sodium chloride 75 mL/hr at 03/04/15 1039   . atorvastatin  80 mg Oral QPM  . cefTRIAXone (ROCEPHIN)  IV  1 g Intravenous Q24H  . diltiazem  120 mg Oral QPM  . ferrous sulfate  325 mg Oral BID WC  . lubiprostone  24 mcg Oral Daily  . metoprolol  100 mg Oral BID  . multivitamin with minerals  1 tablet Oral Daily  . tamsulosin  0.4 mg Oral Daily  . terazosin  2 mg Oral QHS   hydrALAZINE  Assessment/ Plan:  [name] is a 81 y.o. black  male with long-standing hypertension, atrial fibrillation, chronic anticoagulation, stroke with left arm and left leg weakness admitted on 03/01/2015 for Urinary retention [R33.9] Acute renal injury ([facility]) [N17.9] Retention of urine [R33.9]  1. Acute Renal failure on chronic kidney disease stage III: Baseline creatinine of 1.78, eGFR of 39 from 10/20/2014. Chronic Kidney disease is  secondary to hypertension. No history of proteinuria.  Acute renal failure from obstructive uropathy. Now foley catheter placed Some level of metabolic acidosis.  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  Creatinine at baseline Not currently on an ACE-I /ARB at home.  Continue to hold diuretics.   2. Hypertension: well controlled. - continue terazosin - new medication as per urology for BPH and obstruction - metoprolol, hydralazine, diltiazem, tamsulosin  3. Urinary Tract infection: empiric ceftriaxone. Cultures with no growth.  Appreciate urology input.      LOS: Tibes, Tison Leibold 12/8/20161:35 PM

## 2015-03-04 NOTE — Evaluation (Signed)
Physical Therapy Evaluation Patient Details Name: Jordan Kline MRN: [id number] DOB: 1927/11/02 Today's Date: 03/04/2015   History of Present Illness  Pt is an 81 y.o. male with h/o stroke with L sided weakness admitted with urinary retention, hematuria, and UTI.  Pt s/p 1 unit PRBC's since admission.  PMH includes stroke 2006 with L UE contraction and L sided weakness, a-fib on Xarelto, CKD, htn.  Clinical Impression  Prior to admission, pt required assist for ambulation and used a quad cane for household ambulation distances.  Pt lives with his wife but his son or daughter-in-law is always there to help as needed.  Currently pt is min to mod assist to stand with quad cane; mod assist for transfer chair to bed; mod assist x2 for ambulation 20 feet with quad cane.  Pt would benefit from skilled PT to address noted impairments and functional limitations.  Discussed STR vs home options extensively with pt and pt's family and pt's family requesting pt to return home.  D/t this, recommend pt discharge to home w/c level with 24/7 assist and HHPT when medically appropriate.  Pt's son works 3rd shift and reports he can help during the day as needed for transfers and family can assist with other parts of pt's care as needed (pt's son reports they took pt home w/c level after his stroke performing transfers only and was able to manage this way before and felt like they could provide the assist he currently needs).     Follow Up Recommendations Home health PT;Supervision/Assistance - 24 hour (w/c level)    Equipment Recommendations       Recommendations for Other Services       Precautions / Restrictions Precautions Precautions: Fall Restrictions Weight Bearing Restrictions: No      Mobility  Bed Mobility Overal bed mobility: Needs Assistance Bed Mobility: Supine to Sit;Sit to Supine     Supine to sit: Supervision;HOB elevated Sit to supine: Min assist;HOB elevated (assist for trunk)    General bed mobility comments: increased time to perform; use of side rail  Transfers Overall transfer level: Needs assistance Equipment used: Quad cane Transfers: Sit to/from [employer] Sit to Stand: Min assist;Mod assist Stand pivot transfers: Mod assist (chair to bed)       General transfer comment: pt required vc's for technique for transfers; pt pushing LE's against bed to stand up  Ambulation/Gait Ambulation/Gait assistance: Mod assist;+2 physical assistance Ambulation Distance (Feet): 20 Feet Assistive device: Quad cane   Gait velocity: decreased   General Gait Details: increased difficulty advancing L LE with distance; decreased B step length/foot clearance/heelstrike; pt with intermittent quick short steps; vc's required for upright posture; distance limited d/t fatigue and SOB; forward posture  Stairs            Wheelchair Mobility    Modified Rankin (Stroke Patients Only)       Balance Overall balance assessment: Needs assistance Sitting-balance support: Single extremity supported;Feet supported Sitting balance-Leahy Scale: Good     Standing balance support: Single extremity supported (on quad cane) Standing balance-Leahy Scale: Poor Standing balance comment: posterior or anterior lean pending on activity                             Pertinent Vitals/Pain Pain Assessment: No/denies pain  Vitals stable and WFL throughout treatment session.    Home Living Family/patient expects to be discharged to:: Private residence Living Arrangements: Spouse/significant other Available Help  at Discharge: Family Type of Home: House Home Access: Ramped entrance     Home Layout: One level Home Equipment: Cane - quad;Bedside commode;Wheelchair - manual; hospital bed Additional Comments: Transport chair; L LE AFO    Prior Function Level of Independence: Needs assistance   Gait / Transfers Assistance Needed: pt able to transfer by  himself usually (one surface to another) but sometimes needed assist with standing; pt always required someone with him for ambulation (SBA to physical assist as needed) using quad cane  ADL's / Homemaking Assistance Needed: takes sponge baths        Hand Dominance        Extremity/Trunk Assessment   Upper Extremity Assessment: LUE deficits/detail RUE Deficits / Details: R UE strength and ROM WFL     LUE Deficits / Details: L UE in flexed contraction   Lower Extremity Assessment: RLE deficits/detail;LLE deficits/detail RLE Deficits / Details: R LE strength and ROM WFL LLE Deficits / Details: L ankle DF -10 degrees (L foot resting in inversion & PF); increased tone noted L LE with sustained clonus at ankle; rest of knee and hip ROM WFL; L knee flexion/extension at least 3/5; L hip flexion at least 4/5; L DF 0/5     Communication   Communication: No difficulties  Cognition Arousal/Alertness: Awake/alert Behavior During Therapy: WFL for tasks assessed/performed Overall Cognitive Status: Within Functional Limits for tasks assessed                      General Comments   Nursing cleared pt for participation in physical therapy.  Pt agreeable to PT session. Pt's wife and son present during session.    Exercises        Assessment/Plan    PT Assessment Patient needs continued PT services  PT Diagnosis Difficulty walking;Generalized weakness   PT Problem List Decreased strength;Decreased range of motion;Decreased activity tolerance;Decreased balance;Decreased mobility  PT Treatment Interventions DME instruction;Gait training;Functional mobility training;Therapeutic activities;Therapeutic exercise;Balance training;Neuromuscular re-education;Patient/family education   PT Goals (Current goals can be found in the Care Plan section) Acute Rehab PT Goals Patient Stated Goal: to go home PT Goal Formulation: With patient/family Time For Goal Achievement: 03/18/15 Potential  to Achieve Goals: Good    Frequency Min 2X/week   Barriers to discharge        Co-evaluation               End of Session Equipment Utilized During Treatment: Gait belt (L ankle AFO) Activity Tolerance: Patient limited by fatigue Patient left: in bed;with call bell/phone within reach;with bed alarm set;with family/visitor present Nurse Communication: Mobility status;Precautions         Time: 1115-1208 PT Time Calculation (min) (ACUTE ONLY): 53 min   Charges:   PT Evaluation $Initial PT Evaluation Tier I: 1 Procedure PT Treatments $Gait Training: 8-22 mins $Therapeutic Activity: 8-22 mins   PT G CodesHendricks Limes:        Telitha Plath 03/04/2015, 1:07 PM Hendricks LimesEmily Anjelina Dung, PT [phone]

## 2015-03-04 NOTE — Discharge Instructions (Signed)
°  DIET:  Cardiac diet  DISCHARGE CONDITION:  Stable  ACTIVITY:  Activity as tolerated  OXYGEN:  Home Oxygen: No.   Oxygen Delivery: room air  DISCHARGE LOCATION:  home    ADDITIONAL DISCHARGE INSTRUCTION:keep foley in until seen by urology   If you experience worsening of your admission symptoms, develop shortness of breath, life threatening emergency, suicidal or homicidal thoughts you must seek medical attention immediately by calling 911 or calling your MD immediately  if symptoms less severe.  You Must read complete instructions/literature along with all the possible adverse reactions/side effects for all the Medicines you take and that have been prescribed to you. Take any new Medicines after you have completely understood and accpet all the possible adverse reactions/side effects.   Please note  You were cared for by a hospitalist during your hospital stay. If you have any questions about your discharge medications or the care you received while you were in the hospital after you are discharged, you can call the unit and asked to speak with the hospitalist on call if the hospitalist that took care of you is not available. Once you are discharged, your primary care physician will handle any further medical issues. Please note that NO REFILLS for any discharge medications will be authorized once you are discharged, as it is imperative that you return to your primary care physician (or establish a relationship with a primary care physician if you do not have one) for your aftercare needs so that they can reassess your need for medications and monitor your lab values.

## 2015-03-04 NOTE — Progress Notes (Signed)
Pt and family given d/c instructions r/t activity, diet, foley care, medications and when to call md and followup care. Voiced understanding, pt given prescription x 1.

## 2015-03-04 NOTE — Care Management (Signed)
Met with patient and his wife. They would like to use [facility] for [place]. I have notified Corene Cornea with [facility] of referral and discharge to home today.

## 2015-03-04 NOTE — Discharge Summary (Signed)
DouglasDouglas Kline, 81 y.o., DOB [birth year], MRN [id number]. Admission date: 03/01/2015 Discharge Date 03/04/2015 Primary MD Luna FuseEJAN-SIE, SHEIKH AHMED, MD Admitting Physician Altamese DillingVaibhavkumar Vachhani, MD  Admission Diagnosis  Urinary retention [R33.9] Acute renal injury [facility](HCC) [N17.9] Retention of urine [R33.9]  Discharge Diagnosis   Principal Problem:  UTI (lower urinary tract infection) Anemia due to hematuria and possible chronic blood loss needs outpatient GI follow-up Acute on chronic renal failure Previous history of CVA Hypertension hyperlipdemia Atrial fibrillation Inguinal hernia      Hospital Course Jordan PennaDouglas Toren is a 81 y.o. male with a known history of stroke with left-sided weakness, atrial fibrillation on Xarelto, chronic kidney disease, hypercholesterol, hypertension- was in emergency room 3 days prior to his ER visit. He was diagnosed with a UTI and was discharged on oral antibiotics.. Patient returns back. This time he was noted to have acute urinary retention and hematuria. He was admitted to the hospital and had a Foley placed. He had a urology evaluation they recommended continuing Foley as outpatient. An outpatient follow-up with urology with Foley removal trial. Also recommended continuing antibiotics. Patient also was noted to have acute renal failure with Foley placement his renal failure improved. Patient also was seen by nephrology. He was noticed to have drop in his hemoglobin. He does have chronic anemia but it worsened. Due to hematuria and possible GI bleed. Patient is on chronic Xarelto which is currently being held. He'll discuss with his primary care provider and his primary cardiologist to discuss whether this would be appropriate to continue. I have also asked him to see a GI physician as outpatient.            Consults  nephrology  and urology   Significant Tests:  See full reports for all details    [place] Renal  03/01/2015  CLINICAL DATA:  Urinary  retention EXAM: RENAL / URINARY TRACT ULTRASOUND COMPLETE COMPARISON:  None. FINDINGS: Right Kidney: Length: 12 cm. Multiple cysts, simple appearing and measuring up to 6 cm. The cortex is diffusely echogenic and smoothly thin. No hydronephrosis. Left Kidney: Length: 12.6 Cm. Multiple simple appearing cysts, the largest measuring 7 cm. The cortex is echogenic and smoothly thinned. No hydronephrosis. Bladder: Successfully decompressed around a Foley catheter, limiting further evaluation. IMPRESSION: 1. No hydronephrosis. The bladder is decompressed by Foley catheter. 2. Chronic medical renal disease with simple cysts. Electronically Signed   By: Marnee SpringJonathon  Watts M.D.   On: 03/01/2015 16:41       Today   Subjective:   Jordan Kline  feels well anxious to go home  Objective:   Blood pressure 134/80, pulse 86, temperature 97.3 F (36.3 C), temperature source Oral, resp. rate 19, height 5\' 8"  (1.727 m), weight 54.069 kg (119 lb 3.2 oz), SpO2 100 %.  .  Intake/Output Summary (Last 24 hours) at 03/04/15 1353 Last data filed at 03/04/15 1100  Gross per 24 hour  Intake   2219 ml  Output      0 ml  Net   2219 ml    Exam VITAL SIGNS: Blood pressure 134/80, pulse 86, temperature 97.3 F (36.3 C), temperature source Oral, resp. rate 19, height 5\' 8"  (1.727 m), weight 54.069 kg (119 lb 3.2 oz), SpO2 100 %.  GENERAL:  81 y.o.-year-old patient lying in the bed with no acute distress.  EYES: Pupils equal, round, reactive to light and accommodation. No scleral icterus. Extraocular muscles intact.  HEENT: Head atraumatic, normocephalic. Oropharynx and nasopharynx clear.  NECK:  Supple, no jugular  venous distention. No thyroid enlargement, no tenderness.  LUNGS: Normal breath sounds bilaterally, no wheezing, rales,rhonchi or crepitation. No use of accessory muscles of respiration.  CARDIOVASCULAR: S1, S2 normal. No murmurs, rubs, or gallops.  ABDOMEN: Soft, nontender, nondistended. Bowel sounds  present. No organomegaly or mass.  EXTREMITIES: No pedal edema, cyanosis, or clubbing.  NEUROLOGIC: Cranial nerves II through XII are intact.  Chronic contracture of his upper extremity due to stroke  PSYCHIATRIC: The patient is alert and oriented x 3.  SKIN: No obvious rash, lesion, or ulcer.   Data Review     CBC w Diff: Lab Results  Component Value Date   WBC 8.0 03/04/2015   HGB 8.7* 03/04/2015   HCT 26.9* 03/04/2015   PLT 212 03/04/2015   LYMPHOPCT 8 02/25/2015   MONOPCT 7 02/25/2015   EOSPCT 1 02/25/2015   BASOPCT 0 02/25/2015   CMP: Lab Results  Component Value Date   NA 143 03/04/2015   K 4.3 03/04/2015   CL 114* 03/04/2015   CO2 22 03/04/2015   BUN 34* 03/04/2015   CREATININE 1.75* 03/04/2015  .  Micro Results Recent Results (from the past 240 hour(s))  Urine culture     Status: None   Collection Time: 03/01/15 11:53 AM  Result Value Ref Range Status   Specimen Description URINE, RANDOM  Final   Special Requests NONE  Final   Culture NO GROWTH 2 DAYS  Final   Report Status 03/03/2015 FINAL  Final        Code Status Orders        Start     Ordered   03/01/15 1730  Do not attempt resuscitation (DNR)   Continuous    Question Answer Comment  In the event of cardiac or respiratory ARREST Do not call a "code blue"   In the event of cardiac or respiratory ARREST Do not perform Intubation, CPR, defibrillation or ACLS   In the event of cardiac or respiratory ARREST Use medication by any route, position, wound care, and other measures to relive pain and suffering. May use oxygen, suction and manual treatment of airway obstruction as needed for comfort.      03/01/15 1729    Advance Directive Documentation        Most Recent Value   Type of Advance Directive  Healthcare Power of Attorney, Living will   Pre-existing out of facility DNR order (yellow form or pink MOST form)     "MOST" Form in Place?            Follow-up Information    Follow up with  TEJAN-SIE, Carlene Coria, MD In 5 days.   Specialty:  Internal Medicine   Contact information:   [address] [place] [address] [phone]       Follow up with Crist Fat, MD In 7 days.   Specialty:  Urology   Why:  urinary rention ,seen in hospital by him   Contact information:   [address] [place] [address] [phone]       Follow up with Lynnae Prude, MD In 10 days.   Specialty:  Gastroenterology   Why:  iron def anemia   Contact information:   [address] [facility] [address] [facility] [place] [address] [phone]       Discharge Medications     Medication List    STOP taking these medications        rivaroxaban 20 MG Tabs tablet  Commonly known as:  XARELTO     triamterene-hydrochlorothiazide 37.5-25 MG capsule  Commonly known as:  DYAZIDE      TAKE these medications        aspirin EC 81 MG tablet  Take 81 mg by mouth daily.     atorvastatin 80 MG tablet  Commonly known as:  LIPITOR  Take 80 mg by mouth every evening.     cephALEXin 500 MG capsule  Commonly known as:  KEFLEX  Take 1 capsule (500 mg total) by mouth 3 (three) times daily.     diltiazem 120 MG 24 hr capsule  Commonly known as:  CARDIZEM CD  Take 120 mg by mouth every evening.     docusate sodium 100 MG capsule  Commonly known as:  COLACE  Take 200 mg by mouth 2 (two) times daily.     ferrous sulfate 325 (65 FE) MG tablet  Take 1 tablet (325 mg total) by mouth 2 (two) times daily with a meal.     lubiprostone 24 MCG capsule  Commonly known as:  AMITIZA  Take 24 mcg by mouth daily.     metoprolol 100 MG tablet  Commonly known as:  LOPRESSOR  Take 100 mg by mouth 2 (two) times daily.     multivitamin with minerals Tabs tablet  Take 1 tablet by mouth daily.     ramipril 5 MG capsule  Commonly known as:  ALTACE  Take 5 mg by mouth daily.     tamsulosin 0.4 MG Caps capsule  Commonly known as:  FLOMAX  Take 1 capsule (0.4  mg total) by mouth daily.     terazosin 2 MG capsule  Commonly known as:  HYTRIN  Take 1 capsule (2 mg total) by mouth at bedtime.           Total Time in preparing paper work, data evaluation and todays exam - 35 minutes  Auburn Bilberry M.D on 03/04/2015 at 1:53 PM  [facility]   Office  [phone]

## 2015-03-12 ENCOUNTER — Ambulatory Visit (INDEPENDENT_AMBULATORY_CARE_PROVIDER_SITE_OTHER): Payer: Medicare HMO | Admitting: Obstetrics and Gynecology

## 2015-03-12 ENCOUNTER — Encounter: Payer: Self-pay | Admitting: Obstetrics and Gynecology

## 2015-03-12 VITALS — BP 137/88 | HR 87 | Ht 67.0 in | Wt 125.0 lb

## 2015-03-12 DIAGNOSIS — R339 Retention of urine, unspecified: Secondary | ICD-10-CM | POA: Diagnosis not present

## 2015-03-12 MED ORDER — FINASTERIDE 5 MG PO TABS: 5.0000 mg | ORAL_TABLET | Freq: Every day | ORAL | Status: DC

## 2015-03-12 NOTE — Progress Notes (Signed)
03/12/2015 2:03 PM   Jordan Kline Dec 19, 1927 [id number]  Referring provider: Sherron Monday, MD [address] [place], [place] [address]  Chief Complaint  Patient presents with  . Urinary Retention    new pt    HPI: Patient is an 81 year old male with a history of hypertension, CAD, hyperlipidemia, A. fib and CVA presenting today with his wife and son for follow-up after being seen in the emergency department on 03/02/15 for acute urinary retention. He was being treated for urinary tract infection at that time though no initial culture was sent. Approximately 600 mL's was drained from his bladder upon Foley insertion. Urology was consult at that time and patient was seen by Dr. Marlou Porch. Recommendations for patient to start once daily Flomax, completion of antibiotics and to follow up with [place] as an outpatient in one week for a voiding trial. He presents today to have Foley catheter removed.  Creatinine was also noticed to noted to be significantly elevated prior to Foley catheter placement at 2.49 after bladder decompression creatinine decreased to 1.75.  Baseline urinary symptoms including weak stream, incomplete bladder emptying, urinary frequency, and nocturia. Prior to recent ED visit patient had not been seen by urologist and was not taking any medications for treatment of his urinary symptoms.   PMH: Past Medical History  Diagnosis Date  . Stroke (HCC)   . Hypertension   . Chronic kidney disease (CKD)   . High cholesterol   . A-fib (HCC)   . Inguinal hernia     Surgical History: Past Surgical History  Procedure Laterality Date  . Appendectomy      Home Medications:    Medication List       This list is accurate as of: 03/12/15  2:03 PM.  Always use your most recent med list.               aspirin EC 81 MG tablet  Take 81 mg by mouth daily.     atorvastatin 80 MG tablet  Commonly known as:  LIPITOR  Take 80 mg by mouth every evening.     cephALEXin 500  MG capsule  Commonly known as:  KEFLEX  Take 1 capsule (500 mg total) by mouth 3 (three) times daily.     diltiazem 120 MG 24 hr capsule  Commonly known as:  CARDIZEM CD  Take 120 mg by mouth every evening.     docusate sodium 100 MG capsule  Commonly known as:  COLACE  Take 200 mg by mouth 2 (two) times daily.     ferrous sulfate 325 (65 FE) MG tablet  Take 1 tablet (325 mg total) by mouth 2 (two) times daily with a meal.     finasteride 5 MG tablet  Commonly known as:  PROSCAR  Take 1 tablet (5 mg total) by mouth daily.     lubiprostone 24 MCG capsule  Commonly known as:  AMITIZA  Take 24 mcg by mouth daily.     metoprolol 100 MG tablet  Commonly known as:  LOPRESSOR  Take 100 mg by mouth 2 (two) times daily.     multivitamin with minerals Tabs tablet  Take 1 tablet by mouth daily.     ramipril 5 MG capsule  Commonly known as:  ALTACE  Take 5 mg by mouth daily.     tamsulosin 0.4 MG Caps capsule  Commonly known as:  FLOMAX  Take 1 capsule (0.4 mg total) by mouth daily.     terazosin 2  MG capsule  Commonly known as:  HYTRIN  Take 1 capsule (2 mg total) by mouth at bedtime.        Allergies: No Known Allergies  Family History: Family History  Problem Relation Age of Onset  . Hypertension Father   . Kidney cancer Neg Hx   . Kidney disease Neg Hx   . Prostate cancer Neg Hx     Social History:  reports that he has quit smoking. He does not have any smokeless tobacco history on file. He reports that he does not drink alcohol or use illicit drugs.  ROS: UROLOGY Frequent Urination?: No Hard to postpone urination?: No Burning/pain with urination?: No Get up at night to urinate?: No Leakage of urine?: No Urine stream starts and stops?: No Trouble starting stream?: No Do you have to strain to urinate?: No Blood in urine?: No Urinary tract infection?: Yes Sexually transmitted disease?: No Injury to kidneys or bladder?: Yes Painful intercourse?: No Weak  stream?: No Erection problems?: Yes Penile pain?: No  Gastrointestinal Nausea?: No Vomiting?: No Indigestion/heartburn?: No Diarrhea?: No Constipation?: No  Constitutional Fever: No Night sweats?: No Weight loss?: No Fatigue?: No  Skin Skin rash/lesions?: No Itching?: No  Eyes Blurred vision?: No Double vision?: No  Ears/Nose/Throat Sore throat?: No Sinus problems?: No  Hematologic/Lymphatic Swollen glands?: No Easy bruising?: No  Cardiovascular Leg swelling?: No Chest pain?: No  Respiratory Cough?: No Shortness of breath?: No  Endocrine Excessive thirst?: No  Musculoskeletal Back pain?: No Joint pain?: Yes  Neurological Headaches?: No Dizziness?: No  Psychologic Depression?: No Anxiety?: No  Physical Exam: BP 137/88 mmHg  Pulse 87  Ht 5\' 7"  (1.702 m)  Wt 125 lb (56.7 kg)  BMI 19.57 kg/m2  Constitutional:  Alert and oriented, No acute distress. HEENT: [place] AT, moist mucus membranes.  Trachea midline, no masses. Cardiovascular: No clubbing, cyanosis, or edema. Respiratory: Normal respiratory effort, no increased work of breathing. GI: Abdomen is soft, nontender, nondistended, no abdominal masses GU: No CVA tenderness.  DRE: Deferred today due to patient's mobility issues. Skin: No rashes, bruises or suspicious lesions. Lymph: No cervical or inguinal adenopathy. Neurologic: left sided hemiparesis, generalized weakness, decreased mobility, wheel chair bound Psychiatric: Normal mood and affect.  Laboratory Data:   Urinalysis  Pertinent Imaging:   Assessment & Plan:    1. Urinary Retention- Differential diagnosis includes bladder dysfunction due to outlet obstruction from enlarged prostate versus neurogenic bladder status post CVA. Foley catheter was removed today without difficulty. Patient ultimately returned within a few hours complaining of increasing lower abdominal discomfort and inability to void. Patient and wife were instructed on  CIC and performed CIC in the office today. They were provided samples and we will place an order for catheters to be sent to patient's home. We will add additional finasteride and continue Flomax. We will follow up with patient in 3 months to reassess symptoms. Patient may benefit from UDS study in the future.  For now he will perform self-catheterization due to urinary retention 4 times daily indefinitely.  There are no diagnoses linked to this encounter.  Return for 3:00 PVR this afternoon; 3 month follow up for urinary retention.  These notes generated with voice recognition software. I apologize for typographical errors.  Earlie LouLindsay Josiah Wojtaszek, FNP  [facility] [address], [address] [place], [place] [id number] [phone]

## 2015-03-12 NOTE — Progress Notes (Signed)
Fill and Pull Catheter Removal  Patient is present today for a catheter removal.  Patient was cleaned and prepped in a sterile fashion 140ml of sterile water/ saline was instilled into the bladder when the patient felt the urge to urinate. 10ml of water was then drained from the balloon.  A 14FR foley cath was removed from the bladder no complications were noted .  Patient as then given some time to void on their own.  Patient cannot void.  Patient tolerated well.  Preformed by: Rupert Stackshelsea Atoya Andrew, LPN  Follow up/ Additional notes: Pt came back around 12:00pm saying he was getting weak and uncomfortable. Pt wife/son was taught CIC. Orders for cath's placed with coloplast. Pt will do CIC 4x daily forever, per Lillia AbedLindsay.

## 2015-03-14 ENCOUNTER — Emergency Department
Admission: EM | Admit: 2015-03-14 | Discharge: 2015-03-14 | Disposition: A | Discharge status: Home / Self Care | Payer: Medicare HMO | Attending: Emergency Medicine | Admitting: Emergency Medicine

## 2015-03-14 ENCOUNTER — Encounter: Payer: Self-pay | Admitting: *Deleted

## 2015-03-14 DIAGNOSIS — R339 Retention of urine, unspecified: Secondary | ICD-10-CM

## 2015-03-14 DIAGNOSIS — N189 Chronic kidney disease, unspecified: Secondary | ICD-10-CM | POA: Insufficient documentation

## 2015-03-14 DIAGNOSIS — N390 Urinary tract infection, site not specified: Secondary | ICD-10-CM | POA: Diagnosis not present

## 2015-03-14 DIAGNOSIS — Z87891 Personal history of nicotine dependence: Secondary | ICD-10-CM | POA: Diagnosis not present

## 2015-03-14 DIAGNOSIS — Z792 Long term (current) use of antibiotics: Secondary | ICD-10-CM | POA: Diagnosis not present

## 2015-03-14 DIAGNOSIS — Z7982 Long term (current) use of aspirin: Secondary | ICD-10-CM | POA: Diagnosis not present

## 2015-03-14 DIAGNOSIS — Z79899 Other long term (current) drug therapy: Secondary | ICD-10-CM | POA: Diagnosis not present

## 2015-03-14 DIAGNOSIS — I129 Hypertensive chronic kidney disease with stage 1 through stage 4 chronic kidney disease, or unspecified chronic kidney disease: Secondary | ICD-10-CM | POA: Diagnosis not present

## 2015-03-14 DIAGNOSIS — R319 Hematuria, unspecified: Secondary | ICD-10-CM

## 2015-03-14 DIAGNOSIS — R338 Other retention of urine: Secondary | ICD-10-CM | POA: Diagnosis not present

## 2015-03-14 LAB — URINALYSIS COMPLETE WITH MICROSCOPIC (ARMC ONLY)
BILIRUBIN URINE: NEGATIVE
GLUCOSE, UA: NEGATIVE mg/dL
Ketones, ur: NEGATIVE mg/dL
Nitrite: NEGATIVE
Protein, ur: 100 mg/dL — AB
SQUAMOUS EPITHELIAL / LPF: NONE SEEN
Specific Gravity, Urine: 1.014 (ref 1.005–1.030)
pH: 5 (ref 5.0–8.0)

## 2015-03-14 LAB — CBC WITH DIFFERENTIAL/PLATELET
Basophils Absolute: 0 10*3/uL (ref 0–0.1)
Basophils Relative: 1 %
EOS ABS: 0.3 10*3/uL (ref 0–0.7)
EOS PCT: 4 %
HCT: 30 % — ABNORMAL LOW (ref 40.0–52.0)
Hemoglobin: 9.6 g/dL — ABNORMAL LOW (ref 13.0–18.0)
LYMPHS ABS: 1 10*3/uL (ref 1.0–3.6)
Lymphocytes Relative: 13 %
MCH: 29.9 pg (ref 26.0–34.0)
MCHC: 31.9 g/dL — AB (ref 32.0–36.0)
MCV: 93.8 fL (ref 80.0–100.0)
MONO ABS: 0.7 10*3/uL (ref 0.2–1.0)
Monocytes Relative: 10 %
Neutro Abs: 5.6 10*3/uL (ref 1.4–6.5)
Neutrophils Relative %: 72 %
PLATELETS: 203 10*3/uL (ref 150–440)
RBC: 3.19 MIL/uL — AB (ref 4.40–5.90)
RDW: 19.5 % — AB (ref 11.5–14.5)
WBC: 7.6 10*3/uL (ref 3.8–10.6)

## 2015-03-14 LAB — BASIC METABOLIC PANEL
Anion gap: 7 (ref 5–15)
BUN: 25 mg/dL — AB (ref 6–20)
CHLORIDE: 108 mmol/L (ref 101–111)
CO2: 25 mmol/L (ref 22–32)
CREATININE: 1.28 mg/dL — AB (ref 0.61–1.24)
Calcium: 8.7 mg/dL — ABNORMAL LOW (ref 8.9–10.3)
GFR calc Af Amer: 57 mL/min — ABNORMAL LOW (ref >60)
GFR calc non Af Amer: 49 mL/min — ABNORMAL LOW (ref >60)
GLUCOSE: 75 mg/dL (ref 65–99)
Potassium: 4.1 mmol/L (ref 3.5–5.1)
SODIUM: 140 mmol/L (ref 135–145)

## 2015-03-14 MED ORDER — SULFAMETHOXAZOLE-TRIMETHOPRIM 800-160 MG PO TABS: 1.0000 | ORAL_TABLET | Freq: Two times a day (BID) | ORAL | Status: DC

## 2015-03-14 NOTE — ED Provider Notes (Signed)
[facility] Emergency Department Provider Note  ____________________________________________  Time seen: Approximately 7:01 PM  I have reviewed the triage vital signs and the nursing notes.   HISTORY  Chief Complaint Urinary Retention    HPI Jordan Kline is a 81 y.o. male patient had an episode of urinary retention earlier this month. At home today his wife could not pass his in and out catheter. Home health nurse attempted to pass in and out catheter that did not work either. Patient came here. Bladder scan showed 1 L in the bladder. 2 nurses.Attempt to pass the Foley and this was unsuccessful therefore urology was called. He was passed. She is now doing much better.   Past Medical History  Diagnosis Date  . Stroke (HCC)   . Hypertension   . Chronic kidney disease (CKD)   . High cholesterol   . A-fib (HCC)   . Inguinal hernia     Patient Active Problem List   Diagnosis Date Noted  . UTI (lower urinary tract infection) 03/01/2015  . Urinary retention 03/01/2015    Past Surgical History  Procedure Laterality Date  . Appendectomy      Current Outpatient Rx  Name  Route  Sig  Dispense  Refill  . aspirin EC 81 MG tablet   Oral   Take 81 mg by mouth daily.         Marland Kitchen atorvastatin (LIPITOR) 80 MG tablet   Oral   Take 80 mg by mouth every evening.          . cephALEXin (KEFLEX) 500 MG capsule   Oral   Take 1 capsule (500 mg total) by mouth 3 (three) times daily. Patient not taking: Reported on 03/12/2015   24 capsule   0   . diltiazem (CARDIZEM CD) 120 MG 24 hr capsule   Oral   Take 120 mg by mouth every evening.          . docusate sodium (COLACE) 100 MG capsule   Oral   Take 200 mg by mouth 2 (two) times daily.         . ferrous sulfate 325 (65 FE) MG tablet   Oral   Take 1 tablet (325 mg total) by mouth 2 (two) times daily with a meal.   60 tablet   0   . finasteride (PROSCAR) 5 MG tablet   Oral   Take 1 tablet (5  mg total) by mouth daily.   30 tablet   3   . lubiprostone (AMITIZA) 24 MCG capsule   Oral   Take 24 mcg by mouth daily.          . metoprolol (LOPRESSOR) 100 MG tablet   Oral   Take 100 mg by mouth 2 (two) times daily.         . Multiple Vitamin (MULTIVITAMIN WITH MINERALS) TABS tablet   Oral   Take 1 tablet by mouth daily.         . ramipril (ALTACE) 5 MG capsule   Oral   Take 5 mg by mouth daily.         Marland Kitchen sulfamethoxazole-trimethoprim (BACTRIM DS,SEPTRA DS) 800-160 MG tablet   Oral   Take 1 tablet by mouth 2 (two) times daily.   20 tablet   0   . tamsulosin (FLOMAX) 0.4 MG CAPS capsule   Oral   Take 1 capsule (0.4 mg total) by mouth daily.   30 capsule   0   . terazosin (  HYTRIN) 2 MG capsule   Oral   Take 1 capsule (2 mg total) by mouth at bedtime.   30 capsule   0     Allergies Review of patient's allergies indicates no known allergies.  Family History  Problem Relation Age of Onset  . Hypertension Father   . Kidney cancer Neg Hx   . Kidney disease Neg Hx   . Prostate cancer Neg Hx     Social History Social History  Substance Use Topics  . Smoking status: Former Games developermoker  . Smokeless tobacco: None  . Alcohol Use: No    Review of Systems Constitutional: No fever/chills Eyes: No visual changes. ENT: No sore throat. Cardiovascular: Denies chest pain. Respiratory: Denies shortness of breath. Gastrointestinal abdominal pain. Distended bladder.  No nausea, no vomiting.  No diarrhea.  No constipation. Genitourinary: Negative for dysuria. Musculoskeletal: Negative for back pain. Skin: Negative for rash. Neurological: Negative for headaches, focal weakness or numbness.  10-point ROS otherwise negative.  ____________________________________________   PHYSICAL EXAM:  VITAL SIGNS: ED Triage Vitals  Enc Vitals Group     BP 03/14/15 1143 147/87 mmHg     Pulse Rate 03/14/15 1143 88     Resp 03/14/15 1143 18     Temp 03/14/15 1143 98 F  (36.7 C)     Temp Source 03/14/15 1143 Oral     SpO2 03/14/15 1143 100 %     Weight 03/14/15 1143 125 lb (56.7 kg)     Height 03/14/15 1143 5\' 8"  (1.727 m)     Head Cir --      Peak Flow --      Pain Score 03/14/15 1439 0     Pain Loc --      Pain Edu? --      Excl. in GC? --    Constitutional: Alert and oriented. Well appearing and in no acute distress. Eyes: Conjunctivae are normal. PERRL. EOMI. Head: Atraumatic. Nose: No congestion/rhinnorhea. Mouth/Throat: Mucous membranes are moist.  Oropharynx non-erythematous. Neck: No stridor.  Cardiovascular: Normal rate, regular rhythm. Grossly normal heart sounds.  Good peripheral circulation. Respiratory: Normal respiratory effort.  No retractions. Lungs CTAB. Gastrointestinal: Soft and tender lower abdomen with distended bladder.. No abdominal bruits. No CVA tenderness. Genitourinary: The past Foley Musculoskeletal: No lower extremity tenderness nor edema.  No joint effusions. \  ____________________________________________   LABS (all labs ordered are listed, but only abnormal results are displayed)  Labs Reviewed  BASIC METABOLIC PANEL - Abnormal; Notable for the following:    BUN 25 (*)    Creatinine, Ser 1.28 (*)    Calcium 8.7 (*)    GFR calc non Af Amer 49 (*)    GFR calc Af Amer 57 (*)    All other components within normal limits  CBC WITH DIFFERENTIAL/PLATELET - Abnormal; Notable for the following:    RBC 3.19 (*)    Hemoglobin 9.6 (*)    HCT 30.0 (*)    MCHC 31.9 (*)    RDW 19.5 (*)    All other components within normal limits  URINALYSIS COMPLETEWITH MICROSCOPIC ([facility] ONLY) - Abnormal; Notable for the following:    Color, Urine AMBER (*)    APPearance TURBID (*)    Hgb urine dipstick 3+ (*)    Protein, ur 100 (*)    Leukocytes, UA 2+ (*)    Bacteria, UA FEW (*)    All other components within normal limits  URINE CULTURE    ____________________________________________  EKG  ____________________________________________  RADIOLOGY   ____________________________________________   PROCEDURES   ____________________________________________   INITIAL IMPRESSION / ASSESSMENT AND PLAN / ED COURSE  Pertinent labs & imaging results that were available during my care of the patient were reviewed by me and considered in my medical decision making (see chart for details).   ____________________________________________   FINAL CLINICAL IMPRESSION(S) / ED DIAGNOSES  Final diagnoses:  Urinary retention  Urinary tract infection with hematuria, site unspecified      Arnaldo Natal, MD 03/14/15 [phone]

## 2015-03-14 NOTE — ED Notes (Signed)
Bladder scan was 950 ml

## 2015-03-14 NOTE — Consult Note (Signed)
Consult: urinary retention, difficult foley Requested by: ED Attending   Chief Complaint: urinary retention  History of Present Illness: 81 yo with retention. Failed void trial in office two days ago and started on CIC. Pt and HH could not do CIC and pt came to ED. Bladder scan ~ 900.   Recently started on tamsulosin and finasteride.   Past Medical History  Diagnosis Date  . Stroke (HCC)   . Hypertension   . Chronic kidney disease (CKD)   . High cholesterol   . A-fib (HCC)   . Inguinal hernia    Past Surgical History  Procedure Laterality Date  . Appendectomy      Home Medications:   (Not in a hospital admission) Allergies: No Known Allergies  Family History  Problem Relation Age of Onset  . Hypertension Father   . Kidney cancer Neg Hx   . Kidney disease Neg Hx   . Prostate cancer Neg Hx    Social History:  reports that he has quit smoking. He does not have any smokeless tobacco history on file. He reports that he does not drink alcohol or use illicit drugs.  ROS: A complete review of systems was performed.  All systems are negative except for pertinent findings as noted. ROS   Physical Exam:  Vital signs in last 24 hours: Temp:  [98 F (36.7 C)] 98 F (36.7 C) (12/18 1143) Pulse Rate:  [88] 88 (12/18 1143) Resp:  [18] 18 (12/18 1143) BP: (147)/(87) 147/87 mmHg (12/18 1143) SpO2:  [100 %] 100 % (12/18 1143) Weight:  [56.7 kg (125 lb)] 56.7 kg (125 lb) (12/18 1143) General:  Alert and oriented, No acute distress Lungs: Regular rate and effort Abdomen: Soft, nontender, nondistended, no abdominal masses, bladder palpable Extremities: No edema Neurologic: Grossly intact GU: penis without mass lesion, scrotum normal. Blood at meatus.   Procedure: pt prepped, draped. 18Fr coude placed without difficulty. Urine clear. Drained ~83200ml.    Laboratory Data:  Results for orders placed or performed during the hospital encounter of 03/14/15 (from the past 24 hour(s))   Basic metabolic panel     Status: Abnormal   Collection Time: 03/14/15  2:57 PM  Result Value Ref Range   Sodium 140 135 - 145 mmol/L   Potassium 4.1 3.5 - 5.1 mmol/L   Chloride 108 101 - 111 mmol/L   CO2 25 22 - 32 mmol/L   Glucose, Bld 75 65 - 99 mg/dL   BUN 25 (H) 6 - 20 mg/dL   Creatinine, Ser 1.611.28 (H) 0.61 - 1.24 mg/dL   Calcium 8.7 (L) 8.9 - 10.3 mg/dL   GFR calc non Af Amer 49 (L) >60 mL/min   GFR calc Af Amer 57 (L) >60 mL/min   Anion gap 7 5 - 15  CBC with Differential     Status: Abnormal   Collection Time: 03/14/15  2:57 PM  Result Value Ref Range   WBC 7.6 3.8 - 10.6 K/uL   RBC 3.19 (L) 4.40 - 5.90 MIL/uL   Hemoglobin 9.6 (L) 13.0 - 18.0 g/dL   HCT 09.630.0 (L) 04.540.0 - 40.952.0 %   MCV 93.8 80.0 - 100.0 fL   MCH 29.9 26.0 - 34.0 pg   MCHC 31.9 (L) 32.0 - 36.0 g/dL   RDW 81.119.5 (H) 91.411.5 - 78.214.5 %   Platelets 203 150 - 440 K/uL   Neutrophils Relative % 72 %   Neutro Abs 5.6 1.4 - 6.5 K/uL   Lymphocytes Relative 13 %  Lymphs Abs 1.0 1.0 - 3.6 K/uL   Monocytes Relative 10 %   Monocytes Absolute 0.7 0.2 - 1.0 K/uL   Eosinophils Relative 4 %   Eosinophils Absolute 0.3 0 - 0.7 K/uL   Basophils Relative 1 %   Basophils Absolute 0.0 0 - 0.1 K/uL   No results found for this or any previous visit (from the past 240 hour(s)). Creatinine:  Recent Labs  03/14/15 1457  CREATININE 1.28*    Impression/Assessment:  Urinary retention - foley placed.  Plan:  F/u [facility] next week or two to plan foley change (office vs. HH). Discussed further eval with pt, wife, daughter-in-law - cystoscopy, consider TURP or laser prostate. They elect to continue foley long-term. Change q 30 days or prn.   Chad Donoghue 03/14/2015, 5:13 PM

## 2015-03-14 NOTE — Discharge Instructions (Signed)
Foley Catheter Care, Adult    A Foley catheter is a soft, flexible tube. This tube is placed into your bladder to drain pee (urine). If you go home with this catheter in place, follow the instructions below.  TAKING CARE OF THE CATHETER  1. Wash your hands with soap and water.  2. Put soap and water on a clean washcloth.  ¨ Clean the skin where the tube goes into your body.  § Clean away from the tube site.  § Never wipe toward the tube.  § Clean the area using a circular motion.  ¨ Remove all the soap. Pat the area dry with a clean towel. For males, reposition the skin that covers the end of the penis (foreskin).  3. Attach the tube to your leg with tape or a leg strap. Do not stretch the tube tight. If you are using tape, remove any stickiness left behind by past tape you used.  4. Keep the drainage bag below your hips. Keep it off the floor.  5. Check your tube during the day. Make sure it is working and draining. Make sure the tube does not curl, twist, or bend.  6. Do not pull on the tube or try to take it out.  TAKING CARE OF THE DRAINAGE BAGS    You will have a large overnight drainage bag and a small leg bag. You may wear the overnight bag any time. Never wear the small bag at night. Follow the directions below.  Emptying the Drainage Bag  Empty your drainage bag when it is ?-½ full or at least 2-3 times a day.  1. Wash your hands with soap and water.  2. Keep the drainage bag below your hips.  3. Hold the dirty bag over the toilet or clean container.  4. Open the pour spout at the bottom of the bag. Empty the pee into the toilet or container. Do not let the pour spout touch anything.  5. Clean the pour spout with a gauze pad or cotton ball that has rubbing alcohol on it.  6. Close the pour spout.  7. Attach the bag to your leg with tape or a leg strap.  8. Wash your hands well.  Changing the Drainage Bag  Change your bag once a month or sooner if it starts to smell or look dirty.   1. Wash your hands with  soap and water.  2. Pinch the rubber tube so that pee does not spill out.  3. Disconnect the catheter tube from the drainage tube at the connection valve. Do not let the tubes touch anything.  4. Clean the end of the catheter tube with an alcohol wipe. Clean the end of a the drainage tube with a different alcohol wipe.  5. Connect the catheter tube to the drainage tube of the clean drainage bag.  6. Attach the new bag to the leg with tape or a leg strap. Avoid attaching the new bag too tightly.  7. Wash your hands well.  Cleaning the Drainage Bag  1. Wash your hands with soap and water.  2. Wash the bag in warm, soapy water.  3. Rinse the bag with warm water.  4. Fill the bag with a mixture of white vinegar and water (1 cup vinegar to 1 quart warm water [.2 liter vinegar to 1 liter warm water]). Close the bag and soak it for 30 minutes in the solution.  5. Rinse the bag with warm water.  6.   Hang the bag to dry with the pour spout open and hanging downward.  7. Store the clean bag (once it is dry) in a clean plastic bag.  8. Wash your hands well.  PREVENT INFECTION  · Wash your hands before and after touching your tube.  · Take showers every day. Wash the skin where the tube enters your body. Do not take baths. Replace wet leg straps with dry ones, if this applies.  · Do not use powders, sprays, or lotions on the genital area. Only use creams, lotions, or ointments as told by your doctor.  · For females, wipe from front to back after going to the bathroom.  · Drink enough fluids to keep your pee clear or pale yellow unless you are told not to have too much fluid (fluid restriction).  · Do not let the drainage bag or tubing touch or lie on the floor.  · Wear cotton underwear to keep the area dry.  GET HELP IF:  · Your pee is cloudy or smells unusually bad.  · Your tube becomes clogged.  · You are not draining pee into the bag or your bladder feels full.  · Your tube starts to leak.  GET HELP RIGHT AWAY IF:  · You have  pain, puffiness (swelling), redness, or yellowish-white fluid (pus) where the tube enters the body.  · You have pain in the belly (abdomen), legs, lower back, or bladder.  · You have a fever.  · You see blood fill the tube, or your pee is pink or red.  · You feel sick to your stomach (nauseous), throw up (vomit), or have chills.  · Your tube gets pulled out.  MAKE SURE YOU:   · Understand these instructions.  · Will watch your condition.  · Will get help right away if you are not doing well or get worse.     This information is not intended to replace advice given to you by your health care provider. Make sure you discuss any questions you have with your health care provider.     Document Released: 07/08/2012 Document Revised: 04/03/2014 Document Reviewed: 07/08/2012  Elsevier Interactive Patient Education ©2016 [employer]   

## 2015-03-14 NOTE — ED Notes (Signed)
Per patient's wife, patient has had a recent history of UTI and urinary retention and has had a catheter. This past Friday, catheter was removed by urologist and family has straight cath over the weekend. Wife reports urine was usually bloody and had small clots. This morning patient's wife could not pass the straight cath and the home health nurse also couldn't insert a Foley catheter. Patient is experiencing lower abominal pain due to retention.

## 2015-03-15 ENCOUNTER — Telehealth: Payer: Self-pay

## 2015-03-15 NOTE — Telephone Encounter (Signed)
-----   Message from Jerilee FieldMatthew Eskridge, MD sent at 03/14/2015  5:40 PM EST ----- Pt back in ED with retention. I placed foley. Can't do CIC. See in office next week or two to plan foley change. Pt has HH, but might to next foley change in office to make sure it goes OK.

## 2015-03-17 LAB — URINE CULTURE
CULTURE: NO GROWTH
SPECIAL REQUESTS: NORMAL

## 2015-03-30 ENCOUNTER — Encounter: Payer: Self-pay | Admitting: Obstetrics and Gynecology

## 2015-03-30 ENCOUNTER — Ambulatory Visit (INDEPENDENT_AMBULATORY_CARE_PROVIDER_SITE_OTHER): Payer: Medicare HMO | Admitting: Obstetrics and Gynecology

## 2015-03-30 VITALS — BP 158/98 | HR 105 | Resp 18

## 2015-03-30 DIAGNOSIS — R339 Retention of urine, unspecified: Secondary | ICD-10-CM

## 2015-03-30 NOTE — Progress Notes (Signed)
Cath Change/ Replacement  Patient is present today for a catheter change due to urinary retention.  10 ml of water was removed from the balloon, a 18 FR foley cath was removed with out difficulty.  Patient was cleaned and prepped in a sterile fashion with betadine and 2% lidocaine jelly was instilled into the urethra. A 16 Coude foley cath was replaced into the bladder no complications were noted Urine return was noted 20ml and urine was yellow in color. The balloon was filled with 10ml of sterile water. A leg bag was attached for drainage.  A night bag was also given to the patient and patient was given instruction on how to change from one bag to another. Patient was given proper instruction on catheter care.    Preformed by: Jordan Kline, CMA

## 2015-03-30 NOTE — Progress Notes (Signed)
11:18 AM   Jordan Kline [birth year] [id number]  Referring provider: Sherron Monday, MD [address] [place], [place] [address]  Chief Complaint  Patient presents with  . Urinary Retention    Catheter removal, Voiding Trial    HPI: Patient is an 82 year old male with a history of hypertension, CAD, hyperlipidemia, A. fib and CVA presenting today with his wife and son for follow-up after being seen in the emergency department on 03/02/15 for acute urinary retention. He was being treated for urinary tract infection at that time though no initial culture was sent. Approximately 600 mL's was drained from his bladder upon Foley insertion. Urology was consult at that time and patient was seen by Dr. Marlou Porch. Recommendations for patient to start once daily Flomax, completion of antibiotics and to follow up with [place] as an outpatient in one week for a voiding trial. He presents today to have Foley catheter removed.  Creatinine was also noticed to noted to be significantly elevated prior to Foley catheter placement at 2.49 after bladder decompression creatinine decreased to 1.75.  Baseline urinary symptoms including weak stream, incomplete bladder emptying, urinary frequency, and nocturia. Prior to recent ED visit patient had not been seen by urologist and was not taking any medications for treatment of his urinary symptoms.  Current Status: Patient was instructed on CIC at his last visit. After Foley catheter removal patient was unable to void and later presented to the emergency department where a Foley catheter was placed. Patient and wife opted to 4 Foley catheter placement for the next few months as opposed to attempting CIC again. She was started on Flomax and finasteride his last visit. Patient reports that he is tolerating a Foley catheter well. It has been draining clear yellow urine.   PMH: Past Medical History  Diagnosis Date  . Stroke (HCC)   . Hypertension   . Chronic kidney  disease (CKD)   . High cholesterol   . A-fib (HCC)   . Inguinal hernia     Surgical History: Past Surgical History  Procedure Laterality Date  . Appendectomy      Home Medications:    Medication List       This list is accurate as of: 03/30/15 11:18 AM.  Always use your most recent med list.               aspirin EC 81 MG tablet  Take 81 mg by mouth daily.     atorvastatin 80 MG tablet  Commonly known as:  LIPITOR  Take 80 mg by mouth every evening.     cephALEXin 500 MG capsule  Commonly known as:  KEFLEX  Take 1 capsule (500 mg total) by mouth 3 (three) times daily.     diltiazem 120 MG 12 hr capsule  Commonly known as:  CARDIZEM SR  TAKE 1 CAPSULE ONCE A DAY     diltiazem 120 MG 24 hr capsule  Commonly known as:  CARDIZEM CD  Take 120 mg by mouth every evening.     docusate sodium 100 MG capsule  Commonly known as:  COLACE  Take 200 mg by mouth 2 (two) times daily.     ferrous sulfate 325 (65 FE) MG tablet  Take 1 tablet (325 mg total) by mouth 2 (two) times daily with a meal.     finasteride 5 MG tablet  Commonly known as:  PROSCAR  Take 1 tablet (5 mg total) by mouth daily.     lubiprostone 24 MCG capsule  Commonly known as:  AMITIZA  Take 24 mcg by mouth daily.     metoprolol 100 MG tablet  Commonly known as:  LOPRESSOR  Take 100 mg by mouth 2 (two) times daily.     multivitamin with minerals Tabs tablet  Take 1 tablet by mouth daily.     ramipril 5 MG capsule  Commonly known as:  ALTACE  Take 5 mg by mouth daily.     sulfamethoxazole-trimethoprim 800-160 MG tablet  Commonly known as:  BACTRIM DS,SEPTRA DS  Take 1 tablet by mouth 2 (two) times daily.     tamsulosin 0.4 MG Caps capsule  Commonly known as:  FLOMAX  Take 1 capsule (0.4 mg total) by mouth daily.     terazosin 2 MG capsule  Commonly known as:  HYTRIN  Take 1 capsule (2 mg total) by mouth at bedtime.     triamterene-hydrochlorothiazide 37.5-25 MG capsule  Commonly known  as:  DYAZIDE  Take by mouth.        Allergies: No Known Allergies  Family History: Family History  Problem Relation Age of Onset  . Hypertension Father   . Kidney cancer Neg Hx   . Kidney disease Neg Hx   . Prostate cancer Neg Hx     Social History:  reports that he has quit smoking. He does not have any smokeless tobacco history on file. He reports that he does not drink alcohol or use illicit drugs.  ROS: UROLOGY Frequent Urination?: No Hard to postpone urination?: No Burning/pain with urination?: No Get up at night to urinate?: No Leakage of urine?: No Urine stream starts and stops?: No Trouble starting stream?: No Do you have to strain to urinate?: No Blood in urine?: No Urinary tract infection?: Yes Sexually transmitted disease?: No Injury to kidneys or bladder?: No Painful intercourse?: No Weak stream?: No Erection problems?: No Penile pain?: No  Gastrointestinal Nausea?: No Vomiting?: No Indigestion/heartburn?: No Diarrhea?: No Constipation?: No  Constitutional Fever: No Night sweats?: No Weight loss?: No Fatigue?: No  Skin Skin rash/lesions?: No Itching?: No  Eyes Blurred vision?: No Double vision?: No  Ears/Nose/Throat Sore throat?: No Sinus problems?: No  Hematologic/Lymphatic Swollen glands?: No Easy bruising?: No  Cardiovascular Leg swelling?: No Chest pain?: No  Respiratory Cough?: No Shortness of breath?: No  Endocrine Excessive thirst?: No  Musculoskeletal Back pain?: No Joint pain?: Yes  Neurological Headaches?: No Dizziness?: No  Psychologic Depression?: No Anxiety?: No  Physical Exam: BP 158/98 mmHg  Pulse 105  Resp 18  Ht   Wt   Constitutional:  Alert and oriented, No acute distress. HEENT: [place] AT, moist mucus membranes.  Trachea midline, no masses. Cardiovascular: No clubbing, cyanosis, or edema. Respiratory: Normal respiratory effort, no increased work of breathing. GI: Abdomen is soft, nontender,  nondistended, no abdominal masses GU: 18 French Foley catheter resident in urinary meatus, no meatal erosion present, normal circumcised phallus DRE: Deferred today due to patient's mobility issues. Skin: No rashes, bruises or suspicious lesions. Lymph: No cervical or inguinal adenopathy. Neurologic: left sided hemiparesis, generalized weakness, decreased mobility, wheel chair bound Psychiatric: Normal mood and affect.  Laboratory Data:   Urinalysis  Pertinent Imaging:   Assessment & Plan:    1. Urinary Retention- after last visit patient presented to the emergency department and urinary retention. He was unable to perform CIC himself at home. It was recommended by Dr. Mena GoesEskridge the patient continue with Foley catheter placement. Outlet procedures were also discussed with patient and family members  by Dr. Mena Goes. Patient and wife prefer Foley catheter placement at this time. We will perform another voiding trial in 2 months after patient has been on finasteride for 3 months. Wife states the patient also was seen by a home health nurse who states that they would be willing to change his catheter. Orders written for catheter to be changed in 1 month with 16 [place] coud-tip catheter. Written instructions on Foley catheter care provided. There are no diagnoses linked to this encounter.  Return for 1 month catheter change; 2 months voiding trial in AM.  These notes generated with voice recognition software. I apologize for typographical errors.  Earlie Lou, FNP  [facility] [address], [address] [place], [place] [address] [phone]

## 2015-04-02 ENCOUNTER — Telehealth: Payer: Self-pay | Admitting: Obstetrics and Gynecology

## 2015-04-02 DIAGNOSIS — N400 Enlarged prostate without lower urinary tract symptoms: Secondary | ICD-10-CM

## 2015-04-02 NOTE — Telephone Encounter (Signed)
I do not see any benefit in and taking those Hytrin and Flomax. I do want him to continue taking the Flomax and finasteride if he needs refills on either one of these medications please provide them for at least 6 months. Thanks

## 2015-04-02 NOTE — Telephone Encounter (Signed)
Please advise 

## 2015-04-02 NOTE — Telephone Encounter (Signed)
Line busy

## 2015-04-02 NOTE — Telephone Encounter (Signed)
Flomax and Hytrin were given to the patient when he went to the ER, he doesn't have any RX refills on these meds.  Does he still need to continue these medications.  If so, please send refills to their pharmacy [facility] [address]  Please call patient and let him know if he is going to receive refills on these meds.  [phone].

## 2015-04-07 MED ORDER — FINASTERIDE 5 MG PO TABS: 5.0000 mg | ORAL_TABLET | Freq: Every day | ORAL | Status: DC

## 2015-04-07 MED ORDER — TAMSULOSIN HCL 0.4 MG PO CAPS: 0.4000 mg | ORAL_CAPSULE | Freq: Every day | ORAL | Status: DC

## 2015-04-07 NOTE — Telephone Encounter (Signed)
Spoke with pt wife in reference to hytrin, flomax, and finasteride. Wife requested refills of flomax and finasteride. Refills sent to pt pharmacy.

## 2015-05-04 ENCOUNTER — Ambulatory Visit: Payer: Medicare HMO | Admitting: Obstetrics and Gynecology

## 2015-05-28 ENCOUNTER — Telehealth: Payer: Self-pay

## 2015-05-28 NOTE — Telephone Encounter (Signed)
Pt home health nurse, Bonita QuinLinda Click, called stating pt has a foul odor to his urine and very cloudy. Nurse Click requested a u/a and cx via their route of [facility]. Made nurse aware that would be ok as long as got the results. Click voiced understanding.

## 2015-06-01 ENCOUNTER — Telehealth: Payer: Self-pay | Admitting: Urology

## 2015-06-01 ENCOUNTER — Telehealth: Payer: Self-pay

## 2015-06-01 ENCOUNTER — Other Ambulatory Visit: Payer: Self-pay | Admitting: Urology

## 2015-06-01 DIAGNOSIS — N390 Urinary tract infection, site not specified: Secondary | ICD-10-CM

## 2015-06-01 MED ORDER — SULFAMETHOXAZOLE-TRIMETHOPRIM 800-160 MG PO TABS: 1.0000 | ORAL_TABLET | Freq: Two times a day (BID) | ORAL | Status: AC

## 2015-06-01 NOTE — Telephone Encounter (Signed)
Reviewed notes and urine culture from [facility].  Patient has symptomatic UTI.  Recommend treating it with Bactrim DS BID x 7 days.

## 2015-06-01 NOTE — Telephone Encounter (Signed)
-----   Message from Crist FatBenjamin W Herrick, MD sent at 06/01/2015  9:35 AM EST ----- Regarding: UTI Please treat this patient with Bactrim DS BID x 7 days. Thank you

## 2015-06-01 NOTE — Telephone Encounter (Signed)
Spoke with pt wife in reference to +ucx. Made aware medication has been sent to pharmacy. Wife voiced understanding.

## 2015-06-07 ENCOUNTER — Ambulatory Visit (INDEPENDENT_AMBULATORY_CARE_PROVIDER_SITE_OTHER): Payer: Medicare HMO | Admitting: Obstetrics and Gynecology

## 2015-06-07 ENCOUNTER — Encounter: Payer: Self-pay | Admitting: Obstetrics and Gynecology

## 2015-06-07 VITALS — BP 128/72 | HR 69 | Resp 16

## 2015-06-07 DIAGNOSIS — R339 Retention of urine, unspecified: Secondary | ICD-10-CM

## 2015-06-07 NOTE — Progress Notes (Signed)
10:33 AM   Jordan Kline [birth year] [id number]  Referring provider: Sherron Monday, MD [address] [place], [place] [address]  Chief Complaint  Patient presents with  . Urinary Retention    voiding trial    HPI: Patient is an 82 year old male with a history of hypertension, CAD, hyperlipidemia, A. fib and CVA presenting today with his wife and son for follow-up after being seen in the emergency department on 03/02/15 for acute urinary retention. He was being treated for urinary tract infection at that time though no initial culture was sent. Approximately 600 mL's was drained from his bladder upon Foley insertion. Urology was consult at that time and patient was seen by Dr. Marlou Porch. Recommendations for patient to start once daily Flomax, completion of antibiotics and to follow up with [place] as an outpatient in one week for a voiding trial. He presents today to have Foley catheter removed.  Creatinine was also noticed to noted to be significantly elevated prior to Foley catheter placement at 2.49 after bladder decompression creatinine decreased to 1.75.  Baseline urinary symptoms including weak stream, incomplete bladder emptying, urinary frequency, and nocturia. Prior to recent ED visit patient had not been seen by urologist and was not taking any medications for treatment of his urinary symptoms.  Current Status: Patient was instructed on CIC at his last visit. After Foley catheter removal patient was unable to void and later presented to the emergency department where a Foley catheter was placed. Patient and wife opted to 4 Foley catheter placement for the next few months as opposed to attempting CIC again. She was started on Flomax and finasteride his last visit. Patient reports that he is tolerating a Foley catheter well. It has been draining clear yellow urine.   PMH: Past Medical History  Diagnosis Date  . Stroke (HCC)   . Hypertension   . Chronic kidney disease (CKD)   . High  cholesterol   . A-fib (HCC)   . Inguinal hernia     Surgical History: Past Surgical History  Procedure Laterality Date  . Appendectomy      Home Medications:    Medication List       This list is accurate as of: 06/07/15 10:33 AM.  Always use your most recent med list.               aspirin EC 81 MG tablet  Take 81 mg by mouth daily.     atorvastatin 80 MG tablet  Commonly known as:  LIPITOR  Take 80 mg by mouth every evening.     diltiazem 120 MG 24 hr capsule  Commonly known as:  CARDIZEM CD  Take 120 mg by mouth every evening.     docusate sodium 100 MG capsule  Commonly known as:  COLACE  Take 200 mg by mouth 2 (two) times daily.     ferrous sulfate 325 (65 FE) MG tablet  Take 1 tablet (325 mg total) by mouth 2 (two) times daily with a meal.     finasteride 5 MG tablet  Commonly known as:  PROSCAR  Take 1 tablet (5 mg total) by mouth daily.     lubiprostone 24 MCG capsule  Commonly known as:  AMITIZA  Take 24 mcg by mouth daily.     metoprolol 100 MG tablet  Commonly known as:  LOPRESSOR  Take 100 mg by mouth 2 (two) times daily.     multivitamin with minerals Tabs tablet  Take 1 tablet by mouth daily.  ramipril 5 MG capsule  Commonly known as:  ALTACE  Take 5 mg by mouth daily.     rivaroxaban 20 MG Tabs tablet  Commonly known as:  XARELTO  Take 20 mg by mouth daily with supper.     sulfamethoxazole-trimethoprim 800-160 MG tablet  Commonly known as:  BACTRIM DS,SEPTRA DS  Take 1 tablet by mouth 2 (two) times daily.     sulfamethoxazole-trimethoprim 800-160 MG tablet  Commonly known as:  BACTRIM DS,SEPTRA DS  Take 1 tablet by mouth 2 (two) times daily.     tamsulosin 0.4 MG Caps capsule  Commonly known as:  FLOMAX  Take 1 capsule (0.4 mg total) by mouth daily.     terazosin 2 MG capsule  Commonly known as:  HYTRIN  Take 1 capsule (2 mg total) by mouth at bedtime.     triamterene-hydrochlorothiazide 37.5-25 MG capsule  Commonly  known as:  DYAZIDE  Take by mouth.        Allergies: No Known Allergies  Family History: Family History  Problem Relation Age of Onset  . Hypertension Father   . Kidney cancer Neg Hx   . Kidney disease Neg Hx   . Prostate cancer Neg Hx     Social History:  reports that he has quit smoking. He does not have any smokeless tobacco history on file. He reports that he does not drink alcohol or use illicit drugs.  ROS: UROLOGY Frequent Urination?: No Hard to postpone urination?: No Burning/pain with urination?: No Get up at night to urinate?: No Leakage of urine?: No Urine stream starts and stops?: No Trouble starting stream?: No Do you have to strain to urinate?: No Blood in urine?: No Urinary tract infection?: No Sexually transmitted disease?: No Injury to kidneys or bladder?: No Painful intercourse?: No Weak stream?: No Erection problems?: No Penile pain?: No  Gastrointestinal Nausea?: No Vomiting?: No Indigestion/heartburn?: No Diarrhea?: No Constipation?: No  Constitutional Fever: No Night sweats?: No Weight loss?: No Fatigue?: No  Skin Skin rash/lesions?: No Itching?: No  Eyes Blurred vision?: No Double vision?: No  Ears/Nose/Throat Sore throat?: No Sinus problems?: No  Hematologic/Lymphatic Swollen glands?: No Easy bruising?: No  Cardiovascular Leg swelling?: No Chest pain?: No  Respiratory Cough?: No Shortness of breath?: No  Endocrine Excessive thirst?: No  Musculoskeletal Back pain?: No Joint pain?: Yes  Neurological Headaches?: No Dizziness?: No  Psychologic Depression?: No Anxiety?: No  Physical Exam: BP 128/72 mmHg  Pulse 69  Resp 16  Ht   Wt   Constitutional:  Alert and oriented, No acute distress. HEENT: [place] AT, moist mucus membranes.  Trachea midline, no masses. Cardiovascular: No clubbing, cyanosis, or edema. Respiratory: Normal respiratory effort, no increased work of breathing. GI: Abdomen is soft,  nontender, nondistended, no abdominal masses GU: 18 French Foley catheter present in urinary meatus, no meatal erosion present, normal circumcised phallus DRE: Deferred today due to patient's mobility issues. Skin: No rashes, bruises or suspicious lesions. Lymph: No cervical or inguinal adenopathy. Neurologic: left sided hemiparesis, generalized weakness, decreased mobility, wheel chair bound Psychiatric: Normal mood and affect.  Laboratory Data:   Urinalysis  Pertinent Imaging:   Assessment & Plan:    1. Urinary Retention- Unsuccessful voiding trial today. Patient and family unable to perform CIC at home. Will consult physician for further recommendation.   There are no diagnoses linked to this encounter.  No Follow-up on file.  These notes generated with voice recognition software. I apologize for typographical errors.  Earlie LouLindsay Aahana Elza,  FNP  [facility] [address], [address] [place], [place] [address] [phone]

## 2015-06-07 NOTE — Progress Notes (Signed)
Fill and Pull Catheter Removal  Patient is present today for a catheter removal.  Patient was cleaned and prepped in a sterile fashion 140 ml of sterile water/ saline was instilled into the bladder when the patient felt the urge to urinate. 10 ml of water was then drained from the balloon.  A 16 FR foley coude cath was removed from the bladder no complications were noted .  Patient as then given some time to void on their own.  Patient could not void on their own after some time.    Preformed by: Natividad BroodSteve Jakorey Mcconathy, CMA  Follow up/ Additional notes: 7916 FR Foley coude cath. was replaced and pt was able to void ~ 150 mL; a leg bag was also attached and cath was draining well.  No problems occurred.

## 2015-06-08 ENCOUNTER — Ambulatory Visit: Payer: Medicare HMO | Admitting: Obstetrics and Gynecology

## 2015-06-09 ENCOUNTER — Telehealth: Payer: Self-pay | Admitting: Obstetrics and Gynecology

## 2015-06-09 NOTE — Telephone Encounter (Signed)
Please notify patient and his wife that I spoke with Dr. Apolinar JunesBrandon and she recommends that we consider placing a SPT within the next few months.   I would like him to be scheduled for follow up here for his next Foley change with Carollee HerterShannon so this can be further discussed. He also needs to have a rectal exam performed at his next visit.    Schedule f/u 3 weeks for Foley change and rectal exam with Carollee HerterShannon.

## 2015-06-10 NOTE — Telephone Encounter (Signed)
Spoke with patient's wife and she states patient does not want SPT placement. Per Lindsay's note she wanted patient to follow up with shannon for his next foley change to talk about this further. [facility]'s [facility].

## 2015-06-14 ENCOUNTER — Telehealth: Payer: Self-pay | Admitting: Urology

## 2015-06-14 NOTE — Telephone Encounter (Signed)
Pt's wife was wondering what she should do about the increased amount of blood in urine. Pt's spouse did note that he recently restarted his blood thinner a couple of weeks ago. Spouse says that the urine has a deeper blood tinge to it now. He is taking Xarelto  20 MG at night. Please advise. Spoke to Ramona briefly about patient and since the Xarelto isn't presribed by a [facility] MD, it was recommended that he call the one who prescribed the medication and speak to them about the matter.

## 2015-06-14 NOTE — Telephone Encounter (Signed)
Spoke with patient's wife in regards and explained that with having a foley and being on a blood thinner blood can happen and that it can be like food coloring where a few drops can turn it all red but if the blood is wine colored or thick clotted blood he should have blood work done to check his levels to make sure his Xarelto doesn't need to be adjusted. Patient's wife verbalized understanding and stated that it was red some of the time but clear. She will follow up with PCP to see when his next blood draw is for them to check his INR. She will contact us if symptoms worsen/SW

## 2015-06-15 ENCOUNTER — Telehealth: Payer: Self-pay

## 2015-06-15 ENCOUNTER — Other Ambulatory Visit: Payer: Self-pay

## 2015-06-15 DIAGNOSIS — N400 Enlarged prostate without lower urinary tract symptoms: Secondary | ICD-10-CM

## 2015-06-15 MED ORDER — FINASTERIDE 5 MG PO TABS: 5.0000 mg | ORAL_TABLET | Freq: Every day | ORAL | Status: DC

## 2015-06-15 NOTE — Telephone Encounter (Signed)
Linda Click, with [facility], called stating pt has an appt on 07/12/15. Bonita QuinLinda wanted to know if the appt was only for a cath change or another voiding trial. Per Bonita QuinLinda she can change catheter if the appt is only a cath change appt. Please advise.

## 2015-06-16 NOTE — Telephone Encounter (Signed)
LMOMBonita Quin- Linda [phone] Pt appt in April is not just for a cath change. Pt DOES need to come to the appt to discuss options with provider.

## 2015-06-16 NOTE — Telephone Encounter (Signed)
No.  It is not just a cath change visit or a voiding trial.  It is to discuss SPT placement.

## 2015-06-30 ENCOUNTER — Telehealth: Payer: Self-pay | Admitting: Urology

## 2015-06-30 NOTE — Telephone Encounter (Signed)
Notified Juliette AlcideMelinda with verbal order to continue care, she will fax order to be signed and faxed back, fax number was also given for UA and culture report to be sent with Shannon's attention.

## 2015-06-30 NOTE — Telephone Encounter (Signed)
Yes.  That would be fine.

## 2015-06-30 NOTE — Telephone Encounter (Signed)
They think the patient has a foul odor and they think he has a UTI do you want them to get a culture? Please call Juliette AlcideMelinda at [phone] [facility]   Thanks  Marcelino DusterMichelle

## 2015-06-30 NOTE — Telephone Encounter (Signed)
Per Carollee HerterShannon ok to give verbal order for UA and culture. Called and spoke with Juliette AlcideMelinda [phone] and gave verbal order. She states she will do this and wanted to know if Carollee HerterShannon would like to continue the nursing care for patient's cath monitoring urinary status once a week, the orders from the hospital will discontinue as of today and she wanted to know if he needed care to continue? Please Advise, thanks

## 2015-07-07 ENCOUNTER — Other Ambulatory Visit: Payer: Self-pay

## 2015-07-12 ENCOUNTER — Other Ambulatory Visit: Payer: Self-pay

## 2015-07-12 ENCOUNTER — Ambulatory Visit (INDEPENDENT_AMBULATORY_CARE_PROVIDER_SITE_OTHER): Payer: Medicare HMO | Admitting: Urology

## 2015-07-12 ENCOUNTER — Encounter: Payer: Self-pay | Admitting: Urology

## 2015-07-12 VITALS — BP 136/79 | HR 92 | Ht 66.0 in | Wt 130.0 lb

## 2015-07-12 DIAGNOSIS — N390 Urinary tract infection, site not specified: Secondary | ICD-10-CM

## 2015-07-12 DIAGNOSIS — R339 Retention of urine, unspecified: Secondary | ICD-10-CM

## 2015-07-12 NOTE — Progress Notes (Signed)
Cath Change/ Replacement  Patient is present today for a catheter change due to urinary retention.  9ml of water was removed from the balloon, a 16FR foley cath was removed with out difficulty.  Patient was cleaned and prepped in a sterile fashion with betadine and 2% lidocaine jelly was instilled into the urethra. A 16 FR foley cath was replaced into the bladder no complications were noted Urine return was noted 4ml and urine was orange in color. The balloon was filled with 10ml of sterile water. A leg bag was attached for drainage.  A night bag was also given to the patient and patient was given instruction on how to change from one bag to another. Patient was given proper instruction on catheter care.    Preformed by: Dallas Schimkeamona Williams CMA  Follow up: One month

## 2015-07-12 NOTE — Progress Notes (Signed)
10:26 AM   Jordan Kline 1927-10-09 [id number]  Referring provider: Sherron MondayS Ahmed Tejan-Sie, MD [address] [place], [place] [id number]  Chief Complaint  Patient presents with  . Urinary Retention    Cath change    HPI: Patient is an 82 year old African-American male who presents today for Foley catheter exchange.  Background history Patient was seen in the emergency department on 03/02/15 for acute urinary retention. He was being treated for urinary tract infection at that time though no initial culture was sent. Approximately 600 mL's was drained from his bladder upon Foley insertion. Urology was consult at that time and patient was seen by Dr. Marlou PorchHerrick. Recommendations for patient to start once daily Flomax, completion of antibiotics and to follow up with us as an outpatient in one week for a voiding trial. He failed the voiding trial, family was not comfortable with CIC and patient refuses SPT placement.  Creatinine was also noticed to noted to be significantly elevated prior to Foley catheter placement at 2.49 after bladder decompression creatinine decreased to 1.75.  Baseline urinary symptoms including weak stream, incomplete bladder emptying, urinary frequency, and nocturia. Prior to recent ED visit patient had not been seen by urologist and was not taking any medications for treatment of his urinary symptoms.  Current Status: Patient states he is tolerating the Foley well.  Patient's wife states that a urine was sent for culture on 06/30/2015.  I queried about his symptoms at that time, the wife stated he was not having any symptoms.  She said the urine was sent because the home health aide stated the urine smelled infected.  Urine culture from that day did grow out Klebsiella pneumonia.  PMH: Past Medical History  Diagnosis Date  . Stroke (HCC)   . Hypertension   . Chronic kidney disease (CKD)   . High cholesterol   . A-fib (HCC)   . Inguinal hernia     Surgical  History: Past Surgical History  Procedure Laterality Date  . Appendectomy      Home Medications:    Medication List       This list is accurate as of: 07/12/15 10:26 AM.  Always use your most recent med list.               aspirin EC 81 MG tablet  Take 81 mg by mouth daily.     atorvastatin 80 MG tablet  Commonly known as:  LIPITOR  Take 80 mg by mouth every evening.     diltiazem 120 MG 24 hr capsule  Commonly known as:  CARDIZEM CD  Take 120 mg by mouth every evening.     docusate sodium 100 MG capsule  Commonly known as:  COLACE  Take 200 mg by mouth 2 (two) times daily.     ferrous sulfate 325 (65 FE) MG tablet  Take 1 tablet (325 mg total) by mouth 2 (two) times daily with a meal.     finasteride 5 MG tablet  Commonly known as:  PROSCAR  Take 1 tablet (5 mg total) by mouth daily.     lubiprostone 24 MCG capsule  Commonly known as:  AMITIZA  Take 24 mcg by mouth daily.     metoprolol 100 MG tablet  Commonly known as:  LOPRESSOR  Take 100 mg by mouth 2 (two) times daily.     multivitamin with minerals Tabs tablet  Take 1 tablet by mouth daily.     ramipril 5 MG capsule  Commonly known  as:  ALTACE  Take 5 mg by mouth daily.     rivaroxaban 20 MG Tabs tablet  Commonly known as:  XARELTO  Take 20 mg by mouth daily with supper.     sulfamethoxazole-trimethoprim 800-160 MG tablet  Commonly known as:  BACTRIM DS,SEPTRA DS  Take 1 tablet by mouth 2 (two) times daily.     tamsulosin 0.4 MG Caps capsule  Commonly known as:  FLOMAX  Take 1 capsule (0.4 mg total) by mouth daily.     terazosin 2 MG capsule  Commonly known as:  HYTRIN  Take 1 capsule (2 mg total) by mouth at bedtime.     triamterene-hydrochlorothiazide 37.5-25 MG capsule  Commonly known as:  DYAZIDE  Take by mouth.        Allergies: No Known Allergies  Family History: Family History  Problem Relation Age of Onset  . Hypertension Father   . Kidney cancer Neg Hx   . Kidney  disease Neg Hx   . Prostate cancer Neg Hx     Social History:  reports that he has quit smoking. He does not have any smokeless tobacco history on file. He reports that he does not drink alcohol or use illicit drugs.  ROS: UROLOGY Frequent Urination?: No Hard to postpone urination?: No Burning/pain with urination?: No Get up at night to urinate?: No Leakage of urine?: No Urine stream starts and stops?: No Trouble starting stream?: No Do you have to strain to urinate?: No Blood in urine?: No Urinary tract infection?: No Sexually transmitted disease?: No Injury to kidneys or bladder?: No Painful intercourse?: No Weak stream?: No Erection problems?: No Penile pain?: No  Gastrointestinal Nausea?: No Vomiting?: No Indigestion/heartburn?: No Diarrhea?: No Constipation?: No  Constitutional Fever: No Night sweats?: No Weight loss?: No Fatigue?: No  Skin Skin rash/lesions?: No Itching?: No  Eyes Blurred vision?: No Double vision?: No  Ears/Nose/Throat Sore throat?: No Sinus problems?: No  Hematologic/Lymphatic Swollen glands?: No Easy bruising?: No  Cardiovascular Leg swelling?: No Chest pain?: No  Respiratory Cough?: No Shortness of breath?: No  Endocrine Excessive thirst?: No  Musculoskeletal Back pain?: No Joint pain?: No  Neurological Headaches?: No Dizziness?: No  Psychologic Depression?: No Anxiety?: No  Physical Exam: BP 136/79 mmHg  Pulse 92  Ht  (1.676 m)  Wt 130 lb (58.968 kg)  BMI 20.99 kg/m2  Constitutional:  Alert and oriented, No acute distress. HEENT: [place] AT, moist mucus membranes.  Trachea midline, no masses. Cardiovascular: No clubbing, cyanosis, or edema. Respiratory: Normal respiratory effort, no increased work of breathing. GI: Abdomen is soft, nontender, nondistended, no abdominal masses GU: 18 French Foley catheter present in urinary meatus, no meatal erosion present, normal circumcised phallus DRE: 60+ gram  prostate Skin: No rashes, bruises or suspicious lesions. Lymph: No cervical or inguinal adenopathy. Neurologic: left sided hemiparesis, generalized weakness, decreased mobility, wheel chair bound Psychiatric: Normal mood and affect.   Assessment & Plan:    1. Urinary Retention- Patient will continue with the indwelling Foley for this time.  He will return in 30 days for a Foley catheter exchange.  2. UTI:   I explained to the patient and his wife and son but since the patient was not having symptoms of a urinary tract infection at the time of this culture, I would not prescribe an antibiotic as it is not appropriate.  I stated that Foley catheters can often become colonized, but that did not mean that there was an actual urinary tract  infection.  I did explain that if patient should have signs of infection, such as mental status changes, fevers or chills, suprapubic or flank pain or gross hematuria, it would be prudent to obtain a urine for culture at that time and treat if its appropriate. The patient and his family voiced their understanding.   Return in about 1 month (around 08/11/2015) for foley catheter exchange.  These notes generated with voice recognition software. I apologize for typographical errors.  Michiel Cowboy, PA-C  [facility] [address], [address] [place], [place] [address] [phone]

## 2015-07-21 ENCOUNTER — Telehealth: Payer: Self-pay | Admitting: Urology

## 2015-07-21 DIAGNOSIS — N3289 Other specified disorders of bladder: Secondary | ICD-10-CM

## 2015-07-21 MED ORDER — OXYBUTYNIN CHLORIDE 5 MG PO TABS: 5.0000 mg | ORAL_TABLET | Freq: Three times a day (TID) | ORAL | Status: DC | PRN

## 2015-07-21 NOTE — Telephone Encounter (Signed)
Pt's wife called saying he's been having bladder spasms since last night. They're on and off but haven't ceased yet. She's wondering if he needs to come in for an office visit and would like a phone call regarding this.  Pt's ph# [phone] Thank you.

## 2015-07-21 NOTE — Telephone Encounter (Signed)
Is the Foley draining? If not, he needs to come in and have a irrigated.  If it is draining, what is the output.

## 2015-07-21 NOTE — Telephone Encounter (Signed)
Spoke with pt wife who stated foley is draining without any problems and is draining its "normal amount". Per Carollee HerterShannon pt can have oxybutynin q8hrs. Medication sent to pharmacy and wife made aware.

## 2015-07-21 NOTE — Telephone Encounter (Signed)
Please advise 

## 2015-08-12 ENCOUNTER — Encounter: Payer: Self-pay | Admitting: Urology

## 2015-08-12 ENCOUNTER — Telehealth: Payer: Self-pay | Admitting: Urology

## 2015-08-12 ENCOUNTER — Ambulatory Visit (INDEPENDENT_AMBULATORY_CARE_PROVIDER_SITE_OTHER): Payer: Medicare HMO | Admitting: Urology

## 2015-08-12 VITALS — BP 132/79 | HR 79 | Ht 67.0 in | Wt 130.0 lb

## 2015-08-12 DIAGNOSIS — R339 Retention of urine, unspecified: Secondary | ICD-10-CM | POA: Diagnosis not present

## 2015-08-12 NOTE — Progress Notes (Signed)
Cath Change/ Replacement  Patient is present today for a catheter change due to urinary retention.  9ml of water was removed from the balloon, a 16FR foley cath was removed with out difficulty.  Patient was cleaned and prepped in a sterile fashion with betadine and 2% lidocaine jelly was instilled into the urethra. A 16FR foley cath was replaced into the bladder no complications were noted Urine return was noted 40ml and urine was yellow in color. The balloon was filled with 10ml of sterile water. A leg bag was attached for drainage.  A night bag was also given to the patient and patient was given instruction on how to change from one bag to another. Patient was given proper instruction on catheter care.    Preformed by: Dallas Schimkeamona Williams CMA  Follow up: One Month

## 2015-08-12 NOTE — Telephone Encounter (Signed)
Would you call [facility] and see if they will change his catheter?

## 2015-08-12 NOTE — Progress Notes (Signed)
5:28 PM   Jordan Kline 12-Dec-1927 [id number]  Referring provider: Sherron Monday, MD [address] [place], [place] [address]  Chief Complaint  Patient presents with  . Urinary Retention    cath change    HPI: Patient is an 82 year old African-American male who presents today for Foley catheter exchange.  Background history Patient was seen in the emergency department on 03/02/15 for acute urinary retention. He was being treated for urinary tract infection at that time though no initial culture was sent. Approximately 600 mL's was drained from his bladder upon Foley insertion. Urology was consult at that time and patient was seen by Dr. Marlou Porch. Recommendations for patient to start once daily Flomax, completion of antibiotics and to follow up with [place] as an outpatient in one week for a voiding trial. He failed the voiding trial, family was not comfortable with [facility] and patient refuses SPT placement.  Creatinine was also noticed to noted to be significantly elevated prior to Foley catheter placement at 2.49 after bladder decompression creatinine decreased to 1.75.  Most recent creatinine was 1.28 in December 2016.  Baseline urinary symptoms including weak stream, incomplete bladder emptying, urinary frequency, and nocturia. Prior to recent ED visit patient had not been seen by urologist and was not taking any medications for treatment of his urinary symptoms.  Current Status: Patient states he is tolerating the Foley well.  He has been having good output.   The urine has not been bloody or cloudy.  He does not complain of bladder spasms or suprapubic pain. He has not had any recent fevers, chills, nausea or vomiting.  Patient's wife and son queried about the possibility of obtaining home health to exchange the catheters as it is becoming more difficult to bring the patient to the office on a monthly basis.    PMH: Past Medical History  Diagnosis Date  . Stroke (HCC)   . Hypertension    . Chronic kidney disease (CKD)   . High cholesterol   . A-fib (HCC)   . Inguinal hernia   . Urinary retention     Surgical History: Past Surgical History  Procedure Laterality Date  . Appendectomy      Home Medications:    Medication List       This list is accurate as of: 08/12/15 11:59 PM.  Always use your most recent med list.               aspirin EC 81 MG tablet  Take 81 mg by mouth daily.     atorvastatin 80 MG tablet  Commonly known as:  LIPITOR  Take 80 mg by mouth every evening.     diltiazem 120 MG 24 hr capsule  Commonly known as:  CARDIZEM CD  Take 120 mg by mouth every evening.     docusate sodium 100 MG capsule  Commonly known as:  COLACE  Take 200 mg by mouth 2 (two) times daily.     ferrous sulfate 325 (65 FE) MG tablet  Take 1 tablet (325 mg total) by mouth 2 (two) times daily with a meal.     finasteride 5 MG tablet  Commonly known as:  PROSCAR  Take 1 tablet (5 mg total) by mouth daily.     lubiprostone 24 MCG capsule  Commonly known as:  AMITIZA  Take 24 mcg by mouth daily.     metoprolol 100 MG tablet  Commonly known as:  LOPRESSOR  Take 100 mg by mouth 2 (  two) times daily.     multivitamin with minerals Tabs tablet  Take 1 tablet by mouth daily.     oxybutynin 5 MG tablet  Commonly known as:  DITROPAN  Take 1 tablet (5 mg total) by mouth every 8 (eight) hours as needed for bladder spasms.     ramipril 5 MG capsule  Commonly known as:  ALTACE  Take 5 mg by mouth daily.     rivaroxaban 20 MG Tabs tablet  Commonly known as:  XARELTO  Take 20 mg by mouth daily with supper.     sulfamethoxazole-trimethoprim 800-160 MG tablet  Commonly known as:  BACTRIM DS,SEPTRA DS  Take 1 tablet by mouth 2 (two) times daily.     tamsulosin 0.4 MG Caps capsule  Commonly known as:  FLOMAX  Take 1 capsule (0.4 mg total) by mouth daily.     terazosin 2 MG capsule  Commonly known as:  HYTRIN  Take 1 capsule (2 mg total) by mouth at bedtime.      triamterene-hydrochlorothiazide 37.5-25 MG capsule  Commonly known as:  DYAZIDE  Take by mouth.        Allergies: No Known Allergies  Family History: Family History  Problem Relation Age of Onset  . Hypertension Father   . Kidney cancer Neg Hx   . Kidney disease Neg Hx   . Prostate cancer Neg Hx     Social History:  reports that he has quit smoking. He does not have any smokeless tobacco history on file. He reports that he does not drink alcohol or use illicit drugs.  ROS: UROLOGY Frequent Urination?: No Hard to postpone urination?: No Burning/pain with urination?: No Get up at night to urinate?: No Leakage of urine?: No Urine stream starts and stops?: No Trouble starting stream?: No Do you have to strain to urinate?: No Blood in urine?: No Urinary tract infection?: No Sexually transmitted disease?: No Injury to kidneys or bladder?: No Painful intercourse?: No Weak stream?: No Erection problems?: No Penile pain?: No  Gastrointestinal Nausea?: No Vomiting?: No Indigestion/heartburn?: No Diarrhea?: No Constipation?: No  Constitutional Fever: No Night sweats?: No Weight loss?: No Fatigue?: No  Skin Skin rash/lesions?: No Itching?: No  Eyes Blurred vision?: No Double vision?: No  Ears/Nose/Throat Sore throat?: No Sinus problems?: No  Hematologic/Lymphatic Swollen glands?: No Easy bruising?: No  Cardiovascular Leg swelling?: No Chest pain?: No  Respiratory Cough?: No Shortness of breath?: No  Endocrine Excessive thirst?: No  Musculoskeletal Back pain?: No Joint pain?: No  Neurological Headaches?: No Dizziness?: No  Psychologic Depression?: No Anxiety?: No  Physical Exam: BP 132/79 mmHg  Pulse 79  Ht 5\' 7"  (1.702 m)  Wt 130 lb (58.968 kg)  BMI 20.36 kg/m2  Constitutional:  Alert and oriented, No acute distress. HEENT: [place] AT, moist mucus membranes.  Trachea midline, no masses. Cardiovascular: No clubbing, cyanosis, or  edema. Respiratory: Normal respiratory effort, no increased work of breathing. GI: Abdomen is soft, nontender, nondistended, no abdominal masses GU: 18 French Foley catheter present in urinary meatus, no meatal erosion present, normal circumcised phallus DRE: 60+ gram prostate Skin: No rashes, bruises or suspicious lesions. Lymph: No cervical or inguinal adenopathy. Neurologic: left sided hemiparesis, generalized weakness, decreased mobility, wheel chair bound Psychiatric: Normal mood and affect.   Assessment & Plan:    1. Urinary Retention- Patient will continue with the indwelling Foley for this time.  He will return in 30 days for a Foley catheter exchange.  We will try to  arrange home health to see if the Foley can be exchanged in the home versus coming to the office.  If we are able to arrange home health to exchange the catheter, he will need to return in December 2017 for a surveillance cystoscopy.    2. UTI:   I explained to the patient and his wife and son but since the patient was not having symptoms of a urinary tract infection at the time of this culture, I would not prescribe an antibiotic as it is not appropriate.  I stated that Foley catheters can often become colonized, but that did not mean that there was an actual urinary tract infection.  I did explain that if patient should have signs of infection, such as mental status changes, fevers or chills, suprapubic or flank pain or gross hematuria, it would be prudent to obtain a urine for culture at that time and treat if its appropriate. The patient and his family voiced their understanding.   Return in about 1 month (around 09/12/2015) for foley catheter exchange.  These notes generated with voice recognition software. I apologize for typographical errors.  Michiel Cowboy, PA-C  [facility] [address] [place], [place] [address] [phone]

## 2015-08-16 NOTE — Telephone Encounter (Signed)
[facility] will have a case manager call back.

## 2015-08-17 NOTE — Telephone Encounter (Signed)
Spoke with Juliette AlcideMelinda at [facility] who stated they could change pt catheter monthly. Melinda requested orders.

## 2015-09-03 ENCOUNTER — Other Ambulatory Visit: Payer: Self-pay

## 2015-09-03 DIAGNOSIS — N400 Enlarged prostate without lower urinary tract symptoms: Secondary | ICD-10-CM

## 2015-09-03 MED ORDER — FINASTERIDE 5 MG PO TABS: 5.0000 mg | ORAL_TABLET | Freq: Every day | ORAL | Status: DC

## 2015-09-14 ENCOUNTER — Ambulatory Visit: Payer: Medicare HMO | Admitting: Urology

## 2015-10-14 ENCOUNTER — Telehealth: Payer: Self-pay | Admitting: Urology

## 2015-10-14 ENCOUNTER — Telehealth: Payer: Self-pay

## 2015-10-14 NOTE — Telephone Encounter (Signed)
Home health nurse, Juliette AlcideMelinda, called stating pt is having UTI like symptoms. Juliette AlcideMelinda stated that pt is having nausea with a temp of 99.5. Juliette AlcideMelinda is going to obtain urine and fax over results.

## 2015-10-14 NOTE — Telephone Encounter (Signed)
error 

## 2015-10-15 ENCOUNTER — Other Ambulatory Visit: Payer: Self-pay | Admitting: Urology

## 2015-10-15 NOTE — Telephone Encounter (Signed)
I have not seen an urinalysis on this patient.  Please call Juliette AlcideMelinda and get those results faxed to us.

## 2015-10-15 NOTE — Telephone Encounter (Signed)
Spoke with receptionist at [facility] who stated u/a and ucx results are not back from [facility] yet.

## 2015-10-19 NOTE — Telephone Encounter (Signed)
Pt ua is in under media. Still waiting for ucx results.

## 2015-10-20 ENCOUNTER — Other Ambulatory Visit: Payer: Self-pay | Admitting: Urology

## 2015-10-20 ENCOUNTER — Telehealth: Payer: Self-pay | Admitting: Urology

## 2015-10-20 DIAGNOSIS — N390 Urinary tract infection, site not specified: Secondary | ICD-10-CM

## 2015-10-20 NOTE — Telephone Encounter (Signed)
Please call in Augmentin [phone]/125, one tablet twice daily for seven days.

## 2015-10-21 MED ORDER — AMOXICILLIN-POT CLAVULANATE 875-125 MG PO TABS: 1.0000 | ORAL_TABLET | Freq: Two times a day (BID) | ORAL | 0 refills | Status: AC

## 2015-10-21 NOTE — Telephone Encounter (Signed)
Pt wife made aware

## 2015-10-21 NOTE — Telephone Encounter (Signed)
No answer. Medication sent to pharmacy.  

## 2015-11-08 ENCOUNTER — Inpatient Hospital Stay
Admission: EM | Admit: 2015-11-08 | Discharge: 2015-11-11 | DRG: 981 | Disposition: A | Discharge status: Home / Self Care | Payer: Medicare HMO | Attending: Internal Medicine | Admitting: Internal Medicine

## 2015-11-08 ENCOUNTER — Inpatient Hospital Stay: Payer: Medicare HMO

## 2015-11-08 DIAGNOSIS — K625 Hemorrhage of anus and rectum: Secondary | ICD-10-CM | POA: Diagnosis present

## 2015-11-08 DIAGNOSIS — E43 Unspecified severe protein-calorie malnutrition: Secondary | ICD-10-CM | POA: Diagnosis present

## 2015-11-08 DIAGNOSIS — N183 Chronic kidney disease, stage 3 (moderate): Secondary | ICD-10-CM | POA: Diagnosis present

## 2015-11-08 DIAGNOSIS — Z8249 Family history of ischemic heart disease and other diseases of the circulatory system: Secondary | ICD-10-CM

## 2015-11-08 DIAGNOSIS — Z87891 Personal history of nicotine dependence: Secondary | ICD-10-CM | POA: Diagnosis not present

## 2015-11-08 DIAGNOSIS — K921 Melena: Principal | ICD-10-CM | POA: Diagnosis present

## 2015-11-08 DIAGNOSIS — D62 Acute posthemorrhagic anemia: Secondary | ICD-10-CM | POA: Diagnosis present

## 2015-11-08 DIAGNOSIS — N401 Enlarged prostate with lower urinary tract symptoms: Secondary | ICD-10-CM | POA: Diagnosis present

## 2015-11-08 DIAGNOSIS — I69354 Hemiplegia and hemiparesis following cerebral infarction affecting left non-dominant side: Secondary | ICD-10-CM | POA: Diagnosis not present

## 2015-11-08 DIAGNOSIS — Z7901 Long term (current) use of anticoagulants: Secondary | ICD-10-CM | POA: Diagnosis not present

## 2015-11-08 DIAGNOSIS — E7800 Pure hypercholesterolemia, unspecified: Secondary | ICD-10-CM | POA: Diagnosis present

## 2015-11-08 DIAGNOSIS — Z79899 Other long term (current) drug therapy: Secondary | ICD-10-CM | POA: Diagnosis not present

## 2015-11-08 DIAGNOSIS — R338 Other retention of urine: Secondary | ICD-10-CM | POA: Diagnosis present

## 2015-11-08 DIAGNOSIS — Z7982 Long term (current) use of aspirin: Secondary | ICD-10-CM

## 2015-11-08 DIAGNOSIS — I129 Hypertensive chronic kidney disease with stage 1 through stage 4 chronic kidney disease, or unspecified chronic kidney disease: Secondary | ICD-10-CM | POA: Diagnosis present

## 2015-11-08 DIAGNOSIS — D649 Anemia, unspecified: Secondary | ICD-10-CM

## 2015-11-08 DIAGNOSIS — I482 Chronic atrial fibrillation: Secondary | ICD-10-CM | POA: Diagnosis present

## 2015-11-08 DIAGNOSIS — K922 Gastrointestinal hemorrhage, unspecified: Secondary | ICD-10-CM | POA: Diagnosis present

## 2015-11-08 LAB — CBC
HCT: 19.8 % — ABNORMAL LOW (ref 40.0–52.0)
Hemoglobin: 6.6 g/dL — ABNORMAL LOW (ref 13.0–18.0)
MCH: 31 pg (ref 26.0–34.0)
MCHC: 33.5 g/dL (ref 32.0–36.0)
MCV: 92.4 fL (ref 80.0–100.0)
Platelets: 269 10*3/uL (ref 150–440)
RBC: 2.14 MIL/uL — ABNORMAL LOW (ref 4.40–5.90)
RDW: 15.5 % — ABNORMAL HIGH (ref 11.5–14.5)
WBC: 8.2 10*3/uL (ref 3.8–10.6)

## 2015-11-08 LAB — URINALYSIS COMPLETE WITH MICROSCOPIC (ARMC ONLY)
Bilirubin Urine: NEGATIVE
Glucose, UA: NEGATIVE mg/dL
Ketones, ur: NEGATIVE mg/dL
Nitrite: NEGATIVE
Protein, ur: 100 mg/dL — AB
Specific Gravity, Urine: 1.012 (ref 1.005–1.030)
Squamous Epithelial / LPF: NONE SEEN
pH: 6 (ref 5.0–8.0)

## 2015-11-08 LAB — COMPREHENSIVE METABOLIC PANEL
ALK PHOS: 57 U/L (ref 38–126)
ALT: 17 U/L (ref 17–63)
AST: 22 U/L (ref 15–41)
Albumin: 2.9 g/dL — ABNORMAL LOW (ref 3.5–5.0)
Anion gap: 8 (ref 5–15)
BILIRUBIN TOTAL: 0.3 mg/dL (ref 0.3–1.2)
BUN: 36 mg/dL — AB (ref 6–20)
CO2: 24 mmol/L (ref 22–32)
CREATININE: 1.83 mg/dL — AB (ref 0.61–1.24)
Calcium: 8.4 mg/dL — ABNORMAL LOW (ref 8.9–10.3)
Chloride: 107 mmol/L (ref 101–111)
GFR calc Af Amer: 37 mL/min — ABNORMAL LOW (ref >60)
GFR, EST NON AFRICAN AMERICAN: 32 mL/min — AB (ref >60)
Glucose, Bld: 181 mg/dL — ABNORMAL HIGH (ref 65–99)
Potassium: 4.4 mmol/L (ref 3.5–5.1)
Sodium: 139 mmol/L (ref 135–145)
TOTAL PROTEIN: 5.9 g/dL — AB (ref 6.5–8.1)

## 2015-11-08 LAB — HEMOGLOBIN AND HEMATOCRIT, BLOOD
HEMATOCRIT: 17 % — AB (ref 40.0–52.0)
HEMOGLOBIN: 5.8 g/dL — AB (ref 13.0–18.0)

## 2015-11-08 LAB — GLUCOSE, CAPILLARY
GLUCOSE-CAPILLARY: 102 mg/dL — AB (ref 65–99)
Glucose-Capillary: 115 mg/dL — ABNORMAL HIGH (ref 65–99)

## 2015-11-08 LAB — HEMOGLOBIN A1C

## 2015-11-08 LAB — PREPARE RBC (CROSSMATCH)

## 2015-11-08 LAB — PROTIME-INR
INR: 2.7
PROTHROMBIN TIME: 29.2 s — AB (ref 11.4–15.2)

## 2015-11-08 MED ORDER — HYDROCODONE-ACETAMINOPHEN 5-325 MG PO TABS: 1.0000 | ORAL_TABLET | ORAL | Status: DC | PRN

## 2015-11-08 MED ORDER — FINASTERIDE 5 MG PO TABS
5.0000 mg | ORAL_TABLET | Freq: Every day | ORAL | Status: DC
  Administered 2015-11-09 – 2015-11-11 (×3): 5 mg via ORAL
  Filled 2015-11-08 (×4): qty 1

## 2015-11-08 MED ORDER — ACETAMINOPHEN 650 MG RE SUPP: 650.0000 mg | Freq: Four times a day (QID) | RECTAL | Status: DC | PRN

## 2015-11-08 MED ORDER — INSULIN ASPART 100 UNIT/ML ~~LOC~~ SOLN: 0.0000 [IU] | Freq: Every day | SUBCUTANEOUS | Status: DC

## 2015-11-08 MED ORDER — SODIUM CHLORIDE 0.9 % IV SOLN
INTRAVENOUS | Status: DC
  Administered 2015-11-09 – 2015-11-10 (×4): via INTRAVENOUS

## 2015-11-08 MED ORDER — METOPROLOL TARTRATE 50 MG PO TABS
100.0000 mg | ORAL_TABLET | Freq: Two times a day (BID) | ORAL | Status: DC
  Administered 2015-11-08 – 2015-11-11 (×6): 100 mg via ORAL
  Filled 2015-11-08 (×6): qty 2

## 2015-11-08 MED ORDER — SODIUM CHLORIDE 0.9 % IV SOLN
Freq: Once | INTRAVENOUS | Status: AC
  Administered 2015-11-08: 20:00:00 via INTRAVENOUS

## 2015-11-08 MED ORDER — ONDANSETRON HCL 4 MG PO TABS: 4.0000 mg | ORAL_TABLET | Freq: Four times a day (QID) | ORAL | Status: DC | PRN

## 2015-11-08 MED ORDER — ATORVASTATIN CALCIUM 20 MG PO TABS
80.0000 mg | ORAL_TABLET | Freq: Every evening | ORAL | Status: DC
  Administered 2015-11-09: 80 mg via ORAL
  Filled 2015-11-08: qty 4

## 2015-11-08 MED ORDER — FERROUS SULFATE 325 (65 FE) MG PO TABS
325.0000 mg | ORAL_TABLET | Freq: Two times a day (BID) | ORAL | Status: DC
  Administered 2015-11-09 – 2015-11-11 (×4): 325 mg via ORAL
  Filled 2015-11-08 (×4): qty 1

## 2015-11-08 MED ORDER — TECHNETIUM TC 99M-LABELED RED BLOOD CELLS IV KIT
22.0590 | PACK | Freq: Once | INTRAVENOUS | Status: AC | PRN
  Administered 2015-11-08: 22.059 via INTRAVENOUS

## 2015-11-08 MED ORDER — ONDANSETRON HCL 4 MG/2ML IJ SOLN: 4.0000 mg | Freq: Four times a day (QID) | INTRAMUSCULAR | Status: DC | PRN

## 2015-11-08 MED ORDER — SODIUM CHLORIDE 0.9 % IV SOLN
Freq: Once | INTRAVENOUS | Status: AC
  Administered 2015-11-08: 14:00:00 via INTRAVENOUS

## 2015-11-08 MED ORDER — ACETAMINOPHEN 325 MG PO TABS: 650.0000 mg | ORAL_TABLET | Freq: Four times a day (QID) | ORAL | Status: DC | PRN

## 2015-11-08 MED ORDER — ADULT MULTIVITAMIN W/MINERALS CH
1.0000 | ORAL_TABLET | Freq: Every day | ORAL | Status: DC
  Administered 2015-11-09 – 2015-11-11 (×3): 1 via ORAL
  Filled 2015-11-08 (×3): qty 1

## 2015-11-08 MED ORDER — SODIUM CHLORIDE 0.9 % IV SOLN
8.0000 mg/h | INTRAVENOUS | Status: AC
  Administered 2015-11-08 – 2015-11-11 (×7): 8 mg/h via INTRAVENOUS
  Filled 2015-11-08 (×7): qty 80

## 2015-11-08 MED ORDER — PANTOPRAZOLE SODIUM 40 MG IV SOLR
80.0000 mg | Freq: Once | INTRAVENOUS | Status: AC
  Administered 2015-11-08: 80 mg via INTRAVENOUS
  Filled 2015-11-08 (×2): qty 80

## 2015-11-08 MED ORDER — TERAZOSIN HCL 2 MG PO CAPS
2.0000 mg | ORAL_CAPSULE | Freq: Every day | ORAL | Status: DC
  Administered 2015-11-08 – 2015-11-10 (×3): 2 mg via ORAL
  Filled 2015-11-08 (×3): qty 1

## 2015-11-08 MED ORDER — TAMSULOSIN HCL 0.4 MG PO CAPS
0.4000 mg | ORAL_CAPSULE | Freq: Every day | ORAL | Status: DC
  Administered 2015-11-09 – 2015-11-11 (×3): 0.4 mg via ORAL
  Filled 2015-11-08 (×3): qty 1

## 2015-11-08 MED ORDER — DOCUSATE SODIUM 100 MG PO CAPS
200.0000 mg | ORAL_CAPSULE | Freq: Two times a day (BID) | ORAL | Status: DC
  Administered 2015-11-08 – 2015-11-10 (×5): 200 mg via ORAL
  Filled 2015-11-08 (×5): qty 2

## 2015-11-08 MED ORDER — PANTOPRAZOLE SODIUM 40 MG IV SOLR: 40.0000 mg | Freq: Two times a day (BID) | INTRAVENOUS | Status: DC

## 2015-11-08 MED ORDER — INSULIN ASPART 100 UNIT/ML ~~LOC~~ SOLN: 0.0000 [IU] | Freq: Three times a day (TID) | SUBCUTANEOUS | Status: DC

## 2015-11-08 MED ORDER — SODIUM CHLORIDE 0.9% FLUSH
3.0000 mL | Freq: Two times a day (BID) | INTRAVENOUS | Status: DC
  Administered 2015-11-08 – 2015-11-10 (×3): 3 mL via INTRAVENOUS

## 2015-11-08 MED ORDER — OXYBUTYNIN CHLORIDE 5 MG PO TABS: 5.0000 mg | ORAL_TABLET | Freq: Three times a day (TID) | ORAL | Status: DC | PRN

## 2015-11-08 NOTE — ED Notes (Signed)
Spoke w/ [facility], will be taking pt in approx 30 min

## 2015-11-08 NOTE — ED Notes (Signed)
Pt transport to [facility]

## 2015-11-08 NOTE — ED Triage Notes (Signed)
Pt bib EMS from home w/ c/o rectal bleeding. Pt sts he noticed it while wiping, stated it was dark red.  Pt denies CP, SOB, n/v/d, weakness or any pain. Pt alert, answers questions, skin tone WNL. NAD.

## 2015-11-08 NOTE — ED Notes (Addendum)
Pt to got to room 102 after [facility].  Pts belonging sent w/ pts wife.  Report called, floor informed of plan and okay with it.  Pts wife sent up to room 102. Pt A/O x4, denies pain, skin tone WNL

## 2015-11-08 NOTE — ED Provider Notes (Signed)
[facility] Emergency Department Provider Note        Time seen: ----------------------------------------- 11:39 AM on 11/08/2015 -----------------------------------------    I have reviewed the triage vital signs and the nursing notes.   HISTORY  Chief Complaint Rectal Bleeding    HPI Jordan Kline is a 82 y.o. male who presents the ER brought by EMS for rectal bleeding. Patient states he noticed it on wiping but it was not significant. He has had dark red blood passage today. He denies weakness, chest pain, shortness of breath, vomiting or diarrhea. Patient states this does not happen before. He does take a blood thinner for previous stroke affecting his left side 11 years ago. Patient denies recent illness.   Past Medical History:  Diagnosis Date  . A-fib (HCC)   . Chronic kidney disease (CKD)   . High cholesterol   . Hypertension   . Inguinal hernia   . Stroke (HCC)   . Urinary retention     Patient Active Problem List   Diagnosis Date Noted  . Recurrent UTI 07/12/2015  . UTI (lower urinary tract infection) 03/01/2015  . Urinary retention 03/01/2015    Past Surgical History:  Procedure Laterality Date  . APPENDECTOMY      Allergies Review of patient's allergies indicates no known allergies.  Social History Social History  Substance Use Topics  . Smoking status: Former Games developermoker  . Smokeless tobacco: Not on file  . Alcohol use No    Review of Systems Constitutional: Negative for fever. Cardiovascular: Negative for chest pain. Respiratory: Negative for shortness of breath. Gastrointestinal: Negative for abdominal pain, vomiting and diarrhea.Positive for rectal bleeding Genitourinary: Negative for dysuria. Musculoskeletal: Negative for back pain. Skin: Negative for rash. Neurological: Negative for headaches, negative for acute weakness  10-point ROS otherwise  negative.  ____________________________________________   PHYSICAL EXAM:  VITAL SIGNS: ED Triage Vitals  Enc Vitals Group     BP 11/08/15 1118 126/88     Pulse Rate 11/08/15 1118 85     Resp 11/08/15 1118 18     Temp 11/08/15 1118 98.1 F (36.7 C)     Temp Source 11/08/15 1118 Oral     SpO2 11/08/15 1118 100 %     Weight 11/08/15 1118 115 lb (52.2 kg)     Height --      Head Circumference --      Peak Flow --      Pain Score 11/08/15 1119 0     Pain Loc --      Pain Edu? --      Excl. in GC? --     Constitutional: Alert and oriented. Well appearing and in no distress. Eyes: Conjunctivae are normal. PERRL. Normal extraocular movements. ENT   Head: Normocephalic and atraumatic.   Nose: No congestion/rhinnorhea.   Mouth/Throat: Mucous membranes are moist.   Neck: No stridor. Cardiovascular: Normal rate, regular rhythm. No murmurs, rubs, or gallops. Respiratory: Normal respiratory effort without tachypnea nor retractions. Breath sounds are clear and equal bilaterally. No wheezes/rales/rhonchi. Gastrointestinal: Soft and nontender. Normal bowel sounds Rectal: Patient passing dark blood rectally. Musculoskeletal: Nontender with normal range of motion in all extremities. No lower extremity tenderness nor edema. Neurologic:  Dysarthria is noted, No gross focal neurologic deficits are appreciated. Previous left-sided hemiparesis is noted Skin:  Skin is warm, dry and intact. No rash noted. Psychiatric: Mood and affect are normal. Speech and behavior are normal.  ____________________________________________  ED COURSE:  Pertinent labs & imaging results  that were available during my care of the patient were reviewed by me and considered in my medical decision making (see chart for details). Clinical Course   patient presents with rectal bleeding, we will check basic labs and coags.  Procedures ____________________________________________   LABS (pertinent  positives/negatives)  Labs Reviewed  CBC - Abnormal; Notable for the following:       Result Value   RBC 2.14 (*)    Hemoglobin 6.6 (*)    HCT 19.8 (*)    RDW 15.5 (*)    All other components within normal limits  COMPREHENSIVE METABOLIC PANEL  POC OCCULT BLOOD, ED   CRITICAL CARE Performed by: Emily FilbertWilliams, Juleah Paradise E   Total critical care time: 30 minutes  Critical care time was exclusive of separately billable procedures and treating other patients.  Critical care was necessary to treat or prevent imminent or life-threatening deterioration.  Critical care was time spent personally by me on the following activities: development of treatment plan with patient and/or surrogate as well as nursing, discussions with consultants, evaluation of patient's response to treatment, examination of patient, obtaining history from patient or surrogate, ordering and performing treatments and interventions, ordering and review of laboratory studies, ordering and review of radiographic studies, pulse oximetry and re-evaluation of patient's condition.  ____________________________________________  FINAL ASSESSMENT AND PLAN  Rectal bleeding, Acute blood loss anemia  Plan: Patient with labs as dictated above. Patient is likely bleeding from Xarelto, has had bleeding problems in the past. His vital signs are stable, I will order a blood transfusion, we will hold Xarelto. I will discuss with the hospitalist for admission.   Emily FilbertWilliams, Dillian Feig E, MD   Note: This dictation was prepared with Dragon dictation. Any transcriptional errors that result from this process are unintentional    Emily FilbertJonathan E Berton Butrick, MD 11/08/15 1210

## 2015-11-08 NOTE — H&P (Signed)
[facility] [place] [facility]   PATIENT NAME: Jordan PennaDouglas Kline    MR#:  [id number]  DATE OF BIRTH:  [birth year]  DATE OF ADMISSION:  11/08/2015  PRIMARY CARE PHYSICIAN: Luna FuseEJAN-SIE, SHEIKH AHMED, MD   REQUESTING/REFERRING PHYSICIAN: Dr. Mayford KnifeWilliams  CHIEF COMPLAINT:   GI bleed/rectal bleed HISTORY OF PRESENT ILLNESS:  Jordan PennaDouglas Waddell  is a 82 y.o. male with a known history of Atrial fibrillation on anticoagulation and chronic kidney disease stage III presents with above complaint. Patient reports since this morning's had several Ludy bowel movements. He denies abdominal pain, nausea or vomiting. He is on anticoagulation for chronic atrial fibrillation. He has had a stroke in the past with left-sided weakness. He has been consented for a transfusion by the ER physician.  PAST MEDICAL HISTORY:   Past Medical History:  Diagnosis Date  . A-fib (HCC)   . Chronic kidney disease (CKD)   . High cholesterol   . Hypertension   . Inguinal hernia   . Stroke (HCC)   . Urinary retention     PAST SURGICAL HISTORY:   Past Surgical History:  Procedure Laterality Date  . APPENDECTOMY      SOCIAL HISTORY:   Social History  Substance Use Topics  . Smoking status: Former Games developermoker  . Smokeless tobacco: No  . Alcohol use No    FAMILY HISTORY:   Family History  Problem Relation Age of Onset  . Hypertension Father   . Kidney cancer Neg Hx   . Kidney disease Neg Hx   . Prostate cancer Neg Hx     DRUG ALLERGIES:  No Known Allergies  REVIEW OF SYSTEMS:   Review of Systems  Constitutional: Negative for chills, fever and malaise/fatigue.  HENT: Negative.  Negative for ear discharge, ear pain, hearing loss, nosebleeds and sore throat.   Eyes: Negative.  Negative for blurred vision and pain.  Respiratory: Negative.  Negative for cough, hemoptysis, shortness of breath and wheezing.   Cardiovascular: Negative.  Negative for chest pain, palpitations and leg swelling.   Gastrointestinal: Positive for blood in stool. Negative for abdominal pain, diarrhea, nausea and vomiting.  Genitourinary: Negative.  Negative for dysuria.  Musculoskeletal: Negative.  Negative for back pain.  Skin: Negative.   Neurological: Positive for focal weakness and weakness. Negative for dizziness, tremors, speech change, seizures and headaches.  Endo/Heme/Allergies: Negative.  Does not bruise/bleed easily.  Psychiatric/Behavioral: Negative.  Negative for depression, hallucinations and suicidal ideas.    MEDICATIONS AT HOME:   Prior to Admission medications   Medication Sig Start Date End Date Taking? Authorizing Provider  aspirin EC 81 MG tablet Take 81 mg by mouth daily.    Historical Provider, MD  atorvastatin (LIPITOR) 80 MG tablet Take 80 mg by mouth every evening.     Historical Provider, MD  diltiazem (CARDIZEM CD) 120 MG 24 hr capsule Take 120 mg by mouth every evening.     Historical Provider, MD  docusate sodium (COLACE) 100 MG capsule Take 200 mg by mouth 2 (two) times daily.    Historical Provider, MD  ferrous sulfate 325 (65 FE) MG tablet Take 1 tablet (325 mg total) by mouth 2 (two) times daily with a meal. 03/04/15   Auburn BilberryShreyang Patel, MD  finasteride (PROSCAR) 5 MG tablet Take 1 tablet (5 mg total) by mouth daily. 09/03/15   Carollee HerterShannon A McGowan, PA-C  lubiprostone (AMITIZA) 24 MCG capsule Take 24 mcg by mouth daily.     Historical Provider, MD  metoprolol (LOPRESSOR)  100 MG tablet Take 100 mg by mouth 2 (two) times daily.    Historical Provider, MD  Multiple Vitamin (MULTIVITAMIN WITH MINERALS) TABS tablet Take 1 tablet by mouth daily.    Historical Provider, MD  oxybutynin (DITROPAN) 5 MG tablet Take 1 tablet (5 mg total) by mouth every 8 (eight) hours as needed for bladder spasms. 07/21/15   Carollee HerterShannon A McGowan, PA-C  ramipril (ALTACE) 5 MG capsule Take 5 mg by mouth daily.    Historical Provider, MD  rivaroxaban (XARELTO) 20 MG TABS tablet Take 20 mg by mouth daily with  supper.    Historical Provider, MD  sulfamethoxazole-trimethoprim (BACTRIM DS,SEPTRA DS) 800-160 MG tablet Take 1 tablet by mouth 2 (two) times daily. 03/14/15   Arnaldo NatalPaul F Malinda, MD  tamsulosin (FLOMAX) 0.4 MG CAPS capsule Take 1 capsule (0.4 mg total) by mouth daily. 04/07/15   Fernanda DrumLindsay C Overton, FNP  terazosin (HYTRIN) 2 MG capsule Take 1 capsule (2 mg total) by mouth at bedtime. 03/04/15   Auburn BilberryShreyang Patel, MD  triamterene-hydrochlorothiazide (DYAZIDE) 37.5-25 MG capsule Take by mouth. 01/22/15   Historical Provider, MD      VITAL SIGNS:  Blood pressure 126/88, pulse 85, temperature 98.1 F (36.7 C), temperature source Oral, resp. rate 18, weight 52.2 kg (115 lb), SpO2 100 %.  PHYSICAL EXAMINATION:   Physical Exam  Constitutional: He is oriented to person, place, and time and well-developed, well-nourished, and in no distress. No distress.  HENT:  Head: Normocephalic.  Eyes: No scleral icterus.  Neck: Normal range of motion. Neck supple. No JVD present. No tracheal deviation present.  Cardiovascular: Normal heart sounds.  Exam reveals no gallop and no friction rub.   No murmur heard. Irr, irr  Pulmonary/Chest: Effort normal and breath sounds normal. No respiratory distress. He has no wheezes. He has no rales. He exhibits no tenderness.  Abdominal: Soft. Bowel sounds are normal. He exhibits no distension and no mass. There is no tenderness. There is no rebound and no guarding.  Musculoskeletal: Normal range of motion. He exhibits no edema.  Neurological: He is alert and oriented to person, place, and time.  LEE 4+/5 with foot drop  Skin: Skin is warm. No rash noted. No erythema.  Psychiatric: Affect and judgment normal.      LABORATORY PANEL:   CBC  Recent Labs Lab 11/08/15 1121  WBC 8.2  HGB 6.6*  HCT 19.8*  PLT 269   ------------------------------------------------------------------------------------------------------------------  Chemistries   Recent Labs Lab  11/08/15 1121  NA 139  K 4.4  CL 107  CO2 24  GLUCOSE 181*  BUN 36*  CREATININE 1.83*  CALCIUM 8.4*  AST 22  ALT 17  ALKPHOS 57  BILITOT 0.3   ------------------------------------------------------------------------------------------------------------------  Cardiac Enzymes No results for input(s): TROPONINI in the last 168 hours. ------------------------------------------------------------------------------------------------------------------  RADIOLOGY:  No results found.  EKG:   Orders placed or performed in visit on 09/25/04  . EKG 12-Lead    IMPRESSION AND PLAN:   [age] year old male with a history of CVA with residual left-sided weakness and chronic atrial fibrillation on anticoagulation who presents with rectal bleeding.  1. Rectal bleeding without abdominal pain and patient taking anticoagulation: Last colonoscopy 2006 with family reports was normal. Differential includes diverticulosis versus upper GI bleed versus angiectasia. GI consult. GI bleeding scan ordered. Monitor hemoglobin. Start IV PPI.  2. Acute blood loss anemia due to problem #1: Patient has consented for blood trans-fusion. Patient will be transfused 2 units PRBCs. Hemoglobin after transfusion.  3. History of chronic atrial fibrillation: Hold anticoagulation due to problem #1. Continue metoprolol and heart rate control.  4. History of hypertension: Due to problem #1 and low normal blood pressure will hold all hypertensive medications with the exception of metoprolol.  5. Chronic urinary retention with chronic Foley catheter: Catheter was changed July 20. Next one is August 20. He is followed by urology. Continue tamsulosin and Hytrin.  6. History of CVA with residual left-sided weakness: Continue atorvastatin. Due to problem #1 I have discontinued aspirin for now.      All the records are reviewed and case discussed with ED provider. Management plans discussed with the patient and he  in agreement  CODE STATUS: dnr  TOTAL TIME TAKING CARE OF THIS PATIENT: 55 minutes.    Grantley Savage M.D on 11/08/2015 at 1:01 PM  Between 7am to 6pm - Pager - [phone]  After 6pm go to [email or web address] - Social research officer, government  [facility]  Office  [phone]  CC: Primary care physician; Luna Fuse, MD

## 2015-11-08 NOTE — ED Notes (Signed)
Dark red blood noticed in bed pan.

## 2015-11-09 ENCOUNTER — Other Ambulatory Visit: Payer: Self-pay

## 2015-11-09 DIAGNOSIS — N400 Enlarged prostate without lower urinary tract symptoms: Secondary | ICD-10-CM

## 2015-11-09 DIAGNOSIS — E43 Unspecified severe protein-calorie malnutrition: Secondary | ICD-10-CM | POA: Insufficient documentation

## 2015-11-09 LAB — BASIC METABOLIC PANEL
Anion gap: 4 — ABNORMAL LOW (ref 5–15)
BUN: 33 mg/dL — AB (ref 6–20)
CHLORIDE: 113 mmol/L — AB (ref 101–111)
CO2: 23 mmol/L (ref 22–32)
Calcium: 7.7 mg/dL — ABNORMAL LOW (ref 8.9–10.3)
Creatinine, Ser: 1.73 mg/dL — ABNORMAL HIGH (ref 0.61–1.24)
GFR calc Af Amer: 39 mL/min — ABNORMAL LOW (ref >60)
GFR calc non Af Amer: 34 mL/min — ABNORMAL LOW (ref >60)
GLUCOSE: 82 mg/dL (ref 65–99)
POTASSIUM: 4.4 mmol/L (ref 3.5–5.1)
SODIUM: 140 mmol/L (ref 135–145)

## 2015-11-09 LAB — GLUCOSE, CAPILLARY
GLUCOSE-CAPILLARY: 83 mg/dL (ref 65–99)
GLUCOSE-CAPILLARY: 100 mg/dL — AB (ref 65–99)
GLUCOSE-CAPILLARY: 103 mg/dL — AB (ref 65–99)
Glucose-Capillary: 73 mg/dL (ref 65–99)
Glucose-Capillary: 74 mg/dL (ref 65–99)

## 2015-11-09 LAB — CBC
HEMATOCRIT: 21.4 % — AB (ref 40.0–52.0)
Hemoglobin: 7.4 g/dL — ABNORMAL LOW (ref 13.0–18.0)
MCH: 29.1 pg (ref 26.0–34.0)
MCHC: 34.5 g/dL (ref 32.0–36.0)
MCV: 84.3 fL (ref 80.0–100.0)
Platelets: 179 10*3/uL (ref 150–440)
RBC: 2.54 MIL/uL — ABNORMAL LOW (ref 4.40–5.90)
RDW: 18.2 % — AB (ref 11.5–14.5)
WBC: 7.2 10*3/uL (ref 3.8–10.6)

## 2015-11-09 LAB — PROTIME-INR
INR: 1.34
Prothrombin Time: 16.7 seconds — ABNORMAL HIGH (ref 11.4–15.2)

## 2015-11-09 LAB — MISC LABCORP TEST (SEND OUT): LABCORP TEST CODE: 1453

## 2015-11-09 LAB — HEMOGLOBIN AND HEMATOCRIT, BLOOD
HEMATOCRIT: 22 % — AB (ref 40.0–52.0)
Hemoglobin: 7.5 g/dL — ABNORMAL LOW (ref 13.0–18.0)

## 2015-11-09 LAB — HEMOGLOBIN: HEMOGLOBIN: 7.2 g/dL — AB (ref 13.0–18.0)

## 2015-11-09 MED ORDER — FINASTERIDE 5 MG PO TABS: 5.0000 mg | ORAL_TABLET | Freq: Every day | ORAL | 3 refills | Status: DC

## 2015-11-09 NOTE — Care Management (Signed)
Admitted to [facility] with the diagnosis of GI bleed. Lives with wife, [name] [phone]). Son is Conservation officer, historic buildingsric. Last seen Dr. Kinnie Feilejansie 3 weeks ago. Followed by [facility] in the home. No skilled nursing. No home oxygen. Uses a rolling walker to aid in ambulation. Wife helps with activities of daily living, doesn't drive. Family helps with errands. No falls. Good appetite. Prescriptions [facility] at [facility] [place]. Family will transport.  Gwenette GreetBrenda s Nasser Ku RN MSN CCM Care Management [phone]

## 2015-11-09 NOTE — Progress Notes (Signed)
Initial Nutrition Assessment  DOCUMENTATION CODES:   Severe malnutrition in context of chronic illness  INTERVENTION:  -Monitor diet progression and intake -Recommend once on po diet Ensure Enlive po BID, each supplement provides 350 kcal and 20 grams of protein or Boost Breeze po TID, each supplement provides 250 kcal and 9 grams of protein     NUTRITION DIAGNOSIS:   Malnutrition related to chronic illness as evidenced by percent weight loss, severe depletion of body fat, severe depletion of muscle mass.    GOAL:   Patient will meet greater than or equal to 90% of their needs    MONITOR:   Diet advancement  REASON FOR ASSESSMENT:   Malnutrition Screening Tool    ASSESSMENT:      Pt admitted with rectal bleeding, vascular surgery following  Past Medical History:  Diagnosis Date  . A-fib (HCC)   . Chronic kidney disease (CKD)   . High cholesterol   . Hypertension   . Inguinal hernia   . Stroke (HCC)   . Urinary retention    Wife at bedside reports poor po intake for the past week prior to admission. Reports pt was still eating but not that much.    Medications reviewed: fe sulfate, aspart, MVI, NS at 16100ml/hr, protonix Labs reviewed: BUN 33, creatinine 1.73, calcium 7.7, hgb 7.4  Nutrition-Focused physical exam completed. Findings are severe fat depletion, moderate to severe muscle depletion, and no edema.     Diet Order:  Diet NPO time specified Except for: Sips with Meds  Skin:  Reviewed, no issues  Last BM:  8/14  Height:   Ht Readings from Last 1 Encounters:  11/08/15 5\' 5"  (1.651 m)    Weight: Per wt encounters noted wt loss of 10% in the last 3 months  Wt Readings from Last 1 Encounters:  11/08/15 117 lb (53.1 kg)    Ideal Body Weight:     BMI:  Body mass index is 19.47 kg/m.  Estimated Nutritional Needs:   Kcal:  1590-1855 kcals/d  Protein:  64-80 g/d  Fluid:  >/= 1.5-1.8 L/d  EDUCATION NEEDS:   No education needs  identified at this time  Vonzell Lindblad B. Freida BusmanAllen, RD, LDN [phone] (pager) Weekend/On-Call pager [phone])

## 2015-11-09 NOTE — Consult Note (Signed)
[facility] Vascular Consult Note  MRN : [id number]  Jordan Kline is a 82 y.o. (07/05/1927) male who presents with chief complaint of  Chief Complaint  Patient presents with  . Rectal Bleeding  .  History of Present Illness: I am asked to evaluate the patient by Dr. Florian BuffVichanni. The patient is a 82 y.o. male with a known history of Atrial fibrillation on anticoagulation and chronic kidney disease stage III presented yesterday with the abrupt onset of bright red blood per rectum. Patient has had multiple bowel movements.. Patient reports since yesterday morning's had several bloody bowel movements. He denies abdominal pain, nausea or vomiting. He is on anticoagulation for chronic atrial fibrillation.  Current Facility-Administered Medications  Medication Dose Route Frequency Provider Last Rate Last Dose  . 0.9 %  sodium chloride infusion   Intravenous Continuous Adrian SaranSital Mody, MD 100 mL/hr at 11/09/15 0115    . acetaminophen (TYLENOL) tablet 650 mg  650 mg Oral Q6H PRN Adrian SaranSital Mody, MD       Or  . acetaminophen (TYLENOL) suppository 650 mg  650 mg Rectal Q6H PRN Adrian SaranSital Mody, MD      . atorvastatin (LIPITOR) tablet 80 mg  80 mg Oral QPM Sital Mody, MD      . docusate sodium (COLACE) capsule 200 mg  200 mg Oral BID Adrian SaranSital Mody, MD   200 mg at 11/08/15 2203  . ferrous sulfate tablet 325 mg  325 mg Oral BID WC Sital Mody, MD      . finasteride (PROSCAR) tablet 5 mg  5 mg Oral Daily Sital Mody, MD      . HYDROcodone-acetaminophen (NORCO/VICODIN) 5-325 MG per tablet 1-2 tablet  1-2 tablet Oral Q4H PRN Adrian SaranSital Mody, MD      . insulin aspart (novoLOG) injection 0-5 Units  0-5 Units Subcutaneous QHS Sital Mody, MD      . insulin aspart (novoLOG) injection 0-9 Units  0-9 Units Subcutaneous TID WC Sital Mody, MD      . metoprolol (LOPRESSOR) tablet 100 mg  100 mg Oral BID Adrian SaranSital Mody, MD   100 mg at 11/09/15 0931  . multivitamin with minerals tablet 1 tablet  1 tablet Oral Daily Sital  Mody, MD      . ondansetron (ZOFRAN) tablet 4 mg  4 mg Oral Q6H PRN Adrian SaranSital Mody, MD       Or  . ondansetron (ZOFRAN) injection 4 mg  4 mg Intravenous Q6H PRN Adrian SaranSital Mody, MD      . oxybutynin (DITROPAN) tablet 5 mg  5 mg Oral Q8H PRN Sital Mody, MD      . pantoprazole (PROTONIX) 80 mg in sodium chloride 0.9 % 250 mL (0.32 mg/mL) infusion  8 mg/hr Intravenous Continuous Adrian SaranSital Mody, MD 25 mL/hr at 11/09/15 0220 8 mg/hr at 11/09/15 0220  . [START ON 11/12/2015] pantoprazole (PROTONIX) injection 40 mg  40 mg Intravenous Q12H Sital Mody, MD      . sodium chloride flush (NS) 0.9 % injection 3 mL  3 mL Intravenous Q12H Adrian SaranSital Mody, MD   3 mL at 11/08/15 2206  . tamsulosin (FLOMAX) capsule 0.4 mg  0.4 mg Oral Daily Sital Mody, MD      . terazosin (HYTRIN) capsule 2 mg  2 mg Oral QHS Adrian SaranSital Mody, MD   2 mg at 11/08/15 2203    Past Medical History:  Diagnosis Date  . A-fib (HCC)   . Chronic kidney disease (CKD)   . High cholesterol   .  Hypertension   . Inguinal hernia   . Stroke (HCC)   . Urinary retention     Past Surgical History:  Procedure Laterality Date  . APPENDECTOMY      Social History Social History  Substance Use Topics  . Smoking status: Former Games developer  . Smokeless tobacco: Never Used  . Alcohol use No    Family History Family History  Problem Relation Age of Onset  . Hypertension Father   . Kidney cancer Neg Hx   . Kidney disease Neg Hx   . Prostate cancer Neg Hx   No family history of bleeding clotting disorders porphyria or autoimmune disease  No Known Allergies   REVIEW OF SYSTEMS (Negative unless checked)  Constitutional: [] Weight loss  [] Fever  [] Chills Cardiac: [] Chest pain   [] Chest pressure   [] Palpitations   [] Shortness of breath when laying flat   [] Shortness of breath at rest   [] Shortness of breath with exertion. Vascular:  [] Pain in legs with walking   [] Pain in legs at rest   [] Pain in legs when laying flat   [] Claudication   [] Pain in feet when walking   [] Pain in feet at rest  [] Pain in feet when laying flat   [] History of DVT   [] Phlebitis   [] Swelling in legs   [] Varicose veins   [] Non-healing ulcers Pulmonary:   [] Uses home oxygen   [] Productive cough   [] Hemoptysis   [] Wheeze  [] COPD   [] Asthma Neurologic:  [] Dizziness  [] Blackouts   [] Seizures   [] History of stroke   [] History of TIA  [] Aphasia   [] Temporary blindness   [] Dysphagia   [] Weakness or numbness in arms   [] Weakness or numbness in legs Musculoskeletal:  [] Arthritis   [] Joint swelling   [] Joint pain   [] Low back pain Hematologic:  [] Easy bruising  [] Easy bleeding   [] Hypercoagulable state   [] Anemic  [] Hepatitis Gastrointestinal:  [] Blood in stool   [] Vomiting blood  [] Gastroesophageal reflux/heartburn   [] Difficulty swallowing. Genitourinary:  [] Chronic kidney disease   [] Difficult urination  [] Frequent urination  [] Burning with urination   [] Blood in urine Skin:  [] Rashes   [] Ulcers   [] Wounds Psychological:  [] History of anxiety   []  History of major depression.    Physical Examination  Vitals:   11/08/15 2045 11/08/15 2343 11/09/15 0441 11/09/15 0932  BP: 113/74 110/65 111/76 140/89  Pulse: 81 91 91 (!) 111  Resp: (!) 22 (!) 22 16   Temp: 98.6 F (37 C) 98 F (36.7 C) 97.9 F (36.6 C)   TempSrc: Oral Oral Oral   SpO2: 100% 100% 100% 100%  Weight:      Height:       Body mass index is 19.47 kg/m.  Head: [place]/AT, No temporalis wasting. Prominent temp pulse not noted. Ear/Nose/Throat: Nares w/o erythema or drainage, oropharynx w/o obsrtuction, Mallampati score: 2.  Dentition poor.  Eyes: PERRLA, Sclera nonicteric.  Neck: Supple, no nuchal rigidity.  No bruit or JVD.  Pulmonary:  Breath sounds equal bilaterally, no use of accessory muscles.  Cardiac: RRR, normal S1, S2, no Murmurs, rubs or gallops. Vascular: Palpable pedal pulses Gastrointestinal: soft, non-tender, non-distended.  Musculoskeletal: Moves all extremities.  No deformity or atrophy. No  edema. Neurologic: CN 2-12 intact. Symmetrical.  Speech is fluent.  Psychiatric: Judgment intact, Mood & affect appropriate for pt's clinical situation. Dermatologic: No rashes or ulcers noted.  No cellulitis or open wounds. Lymph : No Cervical,  or Inguinal lymphadenopathy.      CBC  Lab Results  Component Value Date   WBC 7.2 11/09/2015   HGB 7.4 (L) 11/09/2015   HCT 21.4 (L) 11/09/2015   MCV 84.3 11/09/2015   PLT 179 11/09/2015    BMET    Component Value Date/Time   NA 140 11/09/2015 0543   K 4.4 11/09/2015 0543   CL 113 (H) 11/09/2015 0543   CO2 23 11/09/2015 0543   GLUCOSE 82 11/09/2015 0543   BUN 33 (H) 11/09/2015 0543   CREATININE 1.73 (H) 11/09/2015 0543   CALCIUM 7.7 (L) 11/09/2015 0543   GFRNONAA 34 (L) 11/09/2015 0543   GFRAA 39 (L) 11/09/2015 0543   Estimated Creatinine Clearance: 23 mL/min (by C-G formula based on SCr of 1.73 mg/dL).  COAG Lab Results  Component Value Date   INR 1.34 11/09/2015   INR 2.70 11/08/2015    Radiology Nuclear scan for GI bleeding performed yesterday shows tracer collecting in the sigmoid colon consistent with GI bleed in the sigmoid colon  Assessment/Plan 1. Rectal bleeding without abdominal pain and patient taking anticoagulation: Last colonoscopy 2006 with family reports was normal. GI bleeding scan shows sigmoid bleed. I have discussed embolization with the patient the risks and benefits as well as alternative therapies including including continued observation monitoring and transfusion as needed were all reviewed. The patient is noncommittal at this time he does request his wife be informed as well she is due to visit with him in the near future. Monitor hemoglobin. Continue IV hydration and PPI.  2. Acute blood loss anemia due to problem #1: Patient has consented for blood trans-fusion. Patient will be transfused 2 units PRBCs. Hemoglobin after transfusion.  3. History of chronic atrial fibrillation: Hold  anticoagulation due to problem #1. Continue metoprolol and heart rate control.  4. History of hypertension: Due to problem #1 and low normal blood pressure will hold all hypertensive medications with the exception of metoprolol.  5. Chronic urinary retention with chronic Foley catheter: Catheter was changed July 20. Next one is August 20. He is followed by urology. Continue tamsulosin and Hytrin.  6. History of CVA with residual left-sided weakness: Continue atorvastatin. Due to problem #1 I have discontinued aspirin for now.   Schnier, Latina CraverGregory G, MD  11/09/2015 10:12 AM

## 2015-11-09 NOTE — Progress Notes (Signed)
Dr. Elisabeth PigeonVachhani notified patient's blood sugar 73. Verbal order to give orange juice, otherwise keep patient NPO sips w/ meds for now.

## 2015-11-09 NOTE — Progress Notes (Signed)
[facility] [place] [facility]   PATIENT NAME: Jordan PennaDouglas Kline    MR#:  [id number]  DATE OF BIRTH:  [birth year]  SUBJECTIVE:  CHIEF COMPLAINT:   Chief Complaint  Patient presents with  . Rectal Bleeding     Came with anemia and rectal bleed, s/p 2 unit transfusion, Hb stable now.   NM scan 11/08/15 showed bleeding in left lower region.    Had one BM 11/09/15 am- with NO blood.  REVIEW OF SYSTEMS:  CONSTITUTIONAL: No fever, fatigue or weakness.  EYES: No blurred or double vision.  EARS, NOSE, AND THROAT: No tinnitus or ear pain.  RESPIRATORY: No cough, shortness of breath, wheezing or hemoptysis.  CARDIOVASCULAR: No chest pain, orthopnea, edema.  GASTROINTESTINAL: No nausea, vomiting, diarrhea or abdominal pain.  GENITOURINARY: No dysuria, hematuria.  ENDOCRINE: No polyuria, nocturia,  HEMATOLOGY: No anemia, easy bruising or bleeding SKIN: No rash or lesion. MUSCULOSKELETAL: No joint pain or arthritis.   NEUROLOGIC: No tingling, numbness, weakness.  PSYCHIATRY: No anxiety or depression.   ROS  DRUG ALLERGIES:  No Known Allergies  VITALS:  Blood pressure 124/81, pulse 94, temperature 98.4 F (36.9 C), temperature source Oral, resp. rate 20, height 5\' 5"  (1.651 m), weight 53.1 kg (117 lb), SpO2 100 %.  PHYSICAL EXAMINATION:  GENERAL:  82 y.o.-year-old patient lying in the bed with no acute distress.  EYES: Pupils equal, round, reactive to light and accommodation. No scleral icterus. Extraocular muscles intact.  HEENT: Head atraumatic, normocephalic. Oropharynx and nasopharynx clear.  NECK:  Supple, no jugular venous distention. No thyroid enlargement, no tenderness.  LUNGS: Normal breath sounds bilaterally, no wheezing, rales,rhonchi or crepitation. No use of accessory muscles of respiration.  CARDIOVASCULAR: S1, S2 normal. No murmurs, rubs, or gallops.  ABDOMEN: Soft, nontender, nondistended. Bowel sounds present. No organomegaly or mass. Chronic foley  in place. EXTREMITIES: No pedal edema, cyanosis, or clubbing.  NEUROLOGIC: Cranial nerves II through XII are intact. Muscle strength 5/5 in all extremities except left upper - which is 1/5- with atrophy. Sensation intact. Gait not checked.  PSYCHIATRIC: The patient is alert and oriented x 3.  SKIN: No obvious rash, lesion, or ulcer.   Physical Exam LABORATORY PANEL:   CBC  Recent Labs Lab 11/09/15 0543  WBC 7.2  HGB 7.4*  HCT 21.4*  PLT 179   ------------------------------------------------------------------------------------------------------------------  Chemistries   Recent Labs Lab 11/08/15 1121 11/09/15 0543  NA 139 140  K 4.4 4.4  CL 107 113*  CO2 24 23  GLUCOSE 181* 82  BUN 36* 33*  CREATININE 1.83* 1.73*  CALCIUM 8.4* 7.7*  AST 22  --   ALT 17  --   ALKPHOS 57  --   BILITOT 0.3  --    ------------------------------------------------------------------------------------------------------------------  Cardiac Enzymes No results for input(s): TROPONINI in the last 168 hours. ------------------------------------------------------------------------------------------------------------------  RADIOLOGY:  Nm Gi Blood Loss  Addendum Date: 11/08/2015   ADDENDUM REPORT: 11/08/2015 16:18 ADDENDUM: These results were called by telephone at the time of interpretation on 11/08/2015 at 4:18 pm to Dr. Patricia PesaSITAL MODY , who verbally acknowledged these results. Electronically Signed   By: Richarda OverlieAdam  Henn M.D.   On: 11/08/2015 16:18   Result Date: 11/08/2015 CLINICAL DATA:  Lower GI bleeding. History of atrial fibrillation and chronic urinary retention with a Foley catheter. History of CVA. EXAM: NUCLEAR MEDICINE GASTROINTESTINAL BLEEDING SCAN TECHNIQUE: Sequential abdominal images were obtained following intravenous administration of Tc-448m labeled red blood cells. RADIOPHARMACEUTICALS:  22.05 mCi Tc-6348m in-vitro labeled  red cells. COMPARISON:  None. FINDINGS: There is almost immediate  uptake in the left lower quadrant. Findings are suggestive for bleeding in the sigmoid colon region. Patient was reportedly having rectal bleeding during the procedure. Large amount of uptake in the region of the penis and/or rectum. IMPRESSION: Positive for GI bleeding. Bleeding source in left lower quadrant and probably in the sigmoid colon region. Electronically Signed: By: Richarda OverlieAdam  Henn M.D. On: 11/08/2015 16:09    ASSESSMENT AND PLAN:   Active Problems:   GIB (gastrointestinal bleeding)   Protein-calorie malnutrition, severe  1. Rectal bleeding without abdominal pain and patient taking anticoagulation: Last colonoscopy 2006 with family reports was normal. Likely diverticular bleed GI bleeding scan showed bleed in LLQ. Monitor hemoglobin. Stable now after 2 u PRBC. On IV PPI.  Vascular offered Angiogram and embolization, but as now no more bleed- pt and his wife agreed on conservative management.  2. Acute blood loss anemia due to problem #1: Patient has consented for blood trans-fusion.  transfused 2 units PRBCs. Hemoglobin after transfusion stable.  3. History of chronic atrial fibrillation: Hold anticoagulation due to problem #1. Continue metoprolol and heart rate control.  Should not resume xarelto on d/c.  4. History of hypertension: Due to problem #1 and low normal blood pressure will hold all hypertensive medications with the exception of metoprolol.   BP stable now.  5. Chronic urinary retention with chronic Foley catheter: Catheter was changed July 20. Next one is August 20. He is followed by urology. Continue tamsulosin and Hytrin.  6. History of CVA with residual left-sided weakness: Continue atorvastatin. Due to problem #1 I have discontinued aspirin for now.     All the records are reviewed and case discussed with Care Management/Social Workerr. Management plans discussed with the patient, family and they are in agreement.  CODE STATUS: DNR  TOTAL TIME  TAKING CARE OF THIS PATIENT: 35 minutes.  Discussed with pt and his wife, plus daughter in law.   POSSIBLE D/C IN 1-2 DAYS, DEPENDING ON CLINICAL CONDITION.   Altamese DillingVACHHANI, Markeya Mincy M.D on 11/09/2015   Between 7am to 6pm - Pager - [phone]  After 6pm go to [email or web address] - password [email or web address]  [facility]  Office  [phone]  CC: Primary care physician; Luna FuseEJAN-SIE, SHEIKH AHMED, MD  Note: This dictation was prepared with Dragon dictation along with smaller phrase technology. Any transcriptional errors that result from this process are unintentional.

## 2015-11-09 NOTE — Care Management Important Message (Signed)
Important Message  Patient Details  Name: Jordan PennaDouglas Kline MRN: [id number] Date of Birth: [birth year]   Medicare Important Message Given:  Yes    [name], RN 11/09/2015, 8:45 AM

## 2015-11-09 NOTE — Progress Notes (Signed)
Dr. Elisabeth PigeonVachhani notified patient's HR irregular 115-130's on telemetry. Verbal order to give PO medications, including scheduled metoprolol. Will re-check and continue to monitor.

## 2015-11-10 ENCOUNTER — Encounter
Admission: EM | Disposition: A | Discharge status: Home / Self Care | Payer: Self-pay | Source: Home / Self Care | Attending: Internal Medicine

## 2015-11-10 HISTORY — PX: PERIPHERAL VASCULAR CATHETERIZATION: SHX172C

## 2015-11-10 LAB — HEMOGLOBIN AND HEMATOCRIT, BLOOD
HCT: 22.4 % — ABNORMAL LOW (ref 40.0–52.0)
Hemoglobin: 7.7 g/dL — ABNORMAL LOW (ref 13.0–18.0)

## 2015-11-10 LAB — TYPE AND SCREEN
ABO/RH(D): O POS
Antibody Screen: NEGATIVE
UNIT DIVISION: 0
UNIT DIVISION: 0
UNIT DIVISION: 0
Unit division: 0

## 2015-11-10 LAB — CBC
HEMATOCRIT: 22.6 % — AB (ref 40.0–52.0)
HEMOGLOBIN: 7.5 g/dL — AB (ref 13.0–18.0)
MCH: 28.2 pg (ref 26.0–34.0)
MCHC: 33.2 g/dL (ref 32.0–36.0)
MCV: 84.9 fL (ref 80.0–100.0)
Platelets: 198 10*3/uL (ref 150–440)
RBC: 2.66 MIL/uL — ABNORMAL LOW (ref 4.40–5.90)
RDW: 18.6 % — AB (ref 11.5–14.5)
WBC: 7.7 10*3/uL (ref 3.8–10.6)

## 2015-11-10 LAB — GLUCOSE, CAPILLARY
GLUCOSE-CAPILLARY: 93 mg/dL (ref 65–99)
GLUCOSE-CAPILLARY: 96 mg/dL (ref 65–99)
GLUCOSE-CAPILLARY: 88 mg/dL (ref 65–99)
Glucose-Capillary: 124 mg/dL — ABNORMAL HIGH (ref 65–99)

## 2015-11-10 LAB — PREPARE RBC (CROSSMATCH)

## 2015-11-10 SURGERY — VISCERAL ANGIOGRAPHY
Anesthesia: Moderate Sedation

## 2015-11-10 MED ORDER — LIDOCAINE-EPINEPHRINE (PF) 1 %-1:200000 IJ SOLN
INTRAMUSCULAR | Status: DC | PRN
  Administered 2015-11-10: 10 mL via INTRADERMAL

## 2015-11-10 MED ORDER — LIDOCAINE-EPINEPHRINE (PF) 1 %-1:200000 IJ SOLN
INTRAMUSCULAR | Status: AC
  Filled 2015-11-10: qty 30

## 2015-11-10 MED ORDER — HEPARIN SODIUM (PORCINE) 1000 UNIT/ML IJ SOLN
INTRAMUSCULAR | Status: AC
  Filled 2015-11-10: qty 1

## 2015-11-10 MED ORDER — MIDAZOLAM HCL 2 MG/2ML IJ SOLN
INTRAMUSCULAR | Status: DC | PRN
  Administered 2015-11-10: 2 mg via INTRAVENOUS

## 2015-11-10 MED ORDER — FENTANYL CITRATE (PF) 100 MCG/2ML IJ SOLN
INTRAMUSCULAR | Status: DC | PRN
  Administered 2015-11-10: 50 ug via INTRAVENOUS

## 2015-11-10 MED ORDER — MIDAZOLAM HCL 5 MG/5ML IJ SOLN
INTRAMUSCULAR | Status: AC
  Filled 2015-11-10: qty 5

## 2015-11-10 MED ORDER — IOPAMIDOL (ISOVUE-300) INJECTION 61%
INTRAVENOUS | Status: DC | PRN
  Administered 2015-11-10: 40 mL via INTRA_ARTERIAL

## 2015-11-10 MED ORDER — DILTIAZEM HCL ER COATED BEADS 120 MG PO CP24
120.0000 mg | ORAL_CAPSULE | Freq: Every day | ORAL | Status: DC
  Administered 2015-11-10 – 2015-11-11 (×2): 120 mg via ORAL
  Filled 2015-11-10 (×2): qty 1

## 2015-11-10 MED ORDER — FENTANYL CITRATE (PF) 100 MCG/2ML IJ SOLN
INTRAMUSCULAR | Status: AC
  Filled 2015-11-10: qty 2

## 2015-11-10 MED ORDER — HEPARIN (PORCINE) IN NACL 2-0.9 UNIT/ML-% IJ SOLN
INTRAMUSCULAR | Status: AC
  Filled 2015-11-10: qty 1000

## 2015-11-10 SURGICAL SUPPLY — 13 items
BLOCK BEAD 500-700 (Vascular Products) ×3 IMPLANT
CATH 5FR REUT (CATHETERS) ×3 IMPLANT
CATH MICROCATH PRGRT 2.8F 110 (CATHETERS) ×1 IMPLANT
CATH PIG 70CM (CATHETERS) ×3 IMPLANT
DEVICE STARCLOSE SE CLOSURE (Vascular Products) ×3 IMPLANT
DEVICE TORQUE (MISCELLANEOUS) ×3 IMPLANT
GLIDEWIRE STIFF .35X180X3 HYDR (WIRE) ×3 IMPLANT
MICROCATH PROGREAT 2.8F 110 CM (CATHETERS) ×3
PACK ANGIOGRAPHY (CUSTOM PROCEDURE TRAY) ×3 IMPLANT
SHEATH BRITE TIP 5FRX11 (SHEATH) ×3 IMPLANT
SYR MEDRAD MARK V 150ML (SYRINGE) ×3 IMPLANT
TUBING CONTRAST HIGH PRESS 72 (TUBING) ×3 IMPLANT
WIRE J 3MM .035X145CM (WIRE) ×3 IMPLANT

## 2015-11-10 NOTE — Progress Notes (Signed)
Patient just had large bright red blood per rectum with stool and now concerned about going home at this time.MD notified.

## 2015-11-10 NOTE — H&P (Signed)
  [facility] History & Physical Update  The patient was interviewed and re-examined.  The patient's previous History and Physical has been reviewed and is unchanged.  There is no change in the plan of care. We plan to proceed with the scheduled procedure.  Jordan Prout, MD  11/10/2015, 6:43 PM

## 2015-11-10 NOTE — Progress Notes (Signed)
[facility]

## 2015-11-10 NOTE — Progress Notes (Signed)
[facility] - [place] at [facility]   PATIENT NAME: Jordan Kline    MR#:  [id number]  DATE OF BIRTH:  25-Jun-1927  SUBJECTIVE:   She was scheduled for discharge and had large bloody bowel movement.  REVIEW OF SYSTEMS:    Review of Systems  Constitutional: Negative.  Negative for chills, fever and malaise/fatigue.  HENT: Negative.  Negative for ear discharge, ear pain, hearing loss, nosebleeds and sore throat.   Eyes: Negative.  Negative for blurred vision and pain.  Respiratory: Negative.  Negative for cough, hemoptysis, shortness of breath and wheezing.   Cardiovascular: Negative.  Negative for chest pain, palpitations and leg swelling.  Gastrointestinal: Positive for blood in stool. Negative for abdominal pain, diarrhea, nausea and vomiting.  Genitourinary: Negative.  Negative for dysuria.  Musculoskeletal: Negative.  Negative for back pain.  Skin: Negative.   Neurological: Negative for dizziness, tremors, speech change, focal weakness, seizures and headaches.  Endo/Heme/Allergies: Negative.  Does not bruise/bleed easily.  Psychiatric/Behavioral: Negative.  Negative for depression, hallucinations and suicidal ideas.    Tolerating Diet: yes      DRUG ALLERGIES:  No Known Allergies  VITALS:  Blood pressure 135/68, pulse (!) 115, temperature 98.1 F (36.7 C), temperature source Oral, resp. rate 18, height 5\' 5"  (1.651 m), weight 53.1 kg (117 lb), SpO2 100 %.  PHYSICAL EXAMINATION:   Physical Exam  Constitutional: He is oriented to person, place, and time and well-developed, well-nourished, and in no distress. No distress.  HENT:  Head: Normocephalic.  Eyes: No scleral icterus.  Neck: Normal range of motion. Neck supple. No JVD present. No tracheal deviation present.  Cardiovascular: Normal rate and regular rhythm.  Exam reveals no gallop and no friction rub.   Murmur heard. Irr, irr tachy  Pulmonary/Chest: Effort normal and breath sounds normal.  No respiratory distress. He has no wheezes. He has no rales. He exhibits no tenderness.  Abdominal: Soft. Bowel sounds are normal. He exhibits no distension and no mass. There is no tenderness. There is no rebound and no guarding.  Musculoskeletal: Normal range of motion. He exhibits no edema.  Neurological: He is alert and oriented to person, place, and time.  Skin: Skin is warm. No rash noted. No erythema.  Psychiatric: Affect and judgment normal.      LABORATORY PANEL:   CBC  Recent Labs Lab 11/10/15 0351  WBC 7.7  HGB 7.5*  HCT 22.6*  PLT 198   ------------------------------------------------------------------------------------------------------------------  Chemistries   Recent Labs Lab 11/08/15 1121 11/09/15 0543  NA 139 140  K 4.4 4.4  CL 107 113*  CO2 24 23  GLUCOSE 181* 82  BUN 36* 33*  CREATININE 1.83* 1.73*  CALCIUM 8.4* 7.7*  AST 22  --   ALT 17  --   ALKPHOS 57  --   BILITOT 0.3  --    ------------------------------------------------------------------------------------------------------------------  Cardiac Enzymes No results for input(s): TROPONINI in the last 168 hours. ------------------------------------------------------------------------------------------------------------------  RADIOLOGY:  Nm Gi Blood Loss  Addendum Date: 11/08/2015   ADDENDUM REPORT: 11/08/2015 16:18 ADDENDUM: These results were called by telephone at the time of interpretation on 11/08/2015 at 4:18 pm to Dr. Patricia PesaSITAL Laurita Peron , who verbally acknowledged these results. Electronically Signed   By: Richarda OverlieAdam  Henn M.D.   On: 11/08/2015 16:18   Result Date: 11/08/2015 CLINICAL DATA:  Lower GI bleeding. History of atrial fibrillation and chronic urinary retention with a Foley catheter. History of CVA. EXAM: NUCLEAR MEDICINE GASTROINTESTINAL BLEEDING SCAN TECHNIQUE: Sequential abdominal images  were obtained following intravenous administration of Tc-8338m labeled red blood cells.  RADIOPHARMACEUTICALS:  22.05 mCi Tc-4138m in-vitro labeled red cells. COMPARISON:  None. FINDINGS: There is almost immediate uptake in the left lower quadrant. Findings are suggestive for bleeding in the sigmoid colon region. Patient was reportedly having rectal bleeding during the procedure. Large amount of uptake in the region of the penis and/or rectum. IMPRESSION: Positive for GI bleeding. Bleeding source in left lower quadrant and probably in the sigmoid colon region. Electronically Signed: By: Richarda OverlieAdam  Henn M.D. On: 11/08/2015 16:09     ASSESSMENT AND PLAN:   82 year old male with a history of CVA with residual left-sided weakness and chronic atrial fibrillation on anticoagulation who presents with rectalbleeding.  1. Rectal bleeding: Prior to admission patient had had multiple episodes of bloody bowel movements. This at stabilizing and no bloody bowel movements while in the hospital. He underwent GI bleeding scale on admission which showed sigmoid bleed.  Patient was planned for discharge now had bloody bowel movement. We will reconsult vascular surgery and patient to decide if he wants to undergo intervention. Hemoglobin ordered stat.   2. Acute blood loss anemia due to problem #1: Patient received 2 units of PRBCs. Plan as above.  3. History of chronic atrial fibrillation: Continue heart rate control with diltiazem and metoprolol. Hold anticoagulation due to problem #1.  4. History of hypertension:  continue diltiazem and metoprolol. ACE inhibitor on hold for now. 5. Chronic urinary retention with chronic Foley catheter: Catheter was changed July 20. Next one is August 20. He is followed by urology. Continue tamsulosin and Hytrin.  6. History of CVA with residual left-sided weakness: Continue atorvastatin. Due to problem #1 I have discontinued aspirin for now.      Management plans discussed with the patient and he is in agreement.  CODE STATUS: DNR  TOTAL TIME TAKING  CARE OF THIS PATIENT: 30 minutes.     POSSIBLE D/C ??, DEPENDING ON CLINICAL CONDITION.   Aalyah Mansouri M.D on 11/10/2015 at 12:47 PM  Between 7am to 6pm - Pager - [phone] After 6pm go to [email or web address] - password [email or web address]  [facility] [place] [facility]  Office  [phone]  CC: Primary care physician; Luna FuseEJAN-SIE, SHEIKH AHMED, MD  Note: This dictation was prepared with Dragon dictation along with smaller phrase technology. Any transcriptional errors that result from this process are unintentional.

## 2015-11-10 NOTE — Progress Notes (Signed)
Jordan Kline   Subjective  - Day of Surgery  The patient had been without a bowel movement for almost 24 hours. In preparation for his discharge from the hospital he was given a regular diet at lunchtime. Subsequently he had what has been described by the medical staff as a massive bloody bowel movement. I was once again contacted to evaluate the patient given the GI bleed and the possible need for embolization.  Objective Vitals:   11/10/15 0916 11/10/15 0917 11/10/15 1253 11/10/15 1412  BP:   126/73 113/69  Pulse: (!) 125 (!) 115 98 96  Resp:   18 20  Temp:   99 F (37.2 C) 98.2 F (36.8 C)  TempSrc:   Oral Oral  SpO2:   100% 100%  Weight:      Height:        Intake/Output Summary (Last 24 hours) at 11/10/15 1725 Last data filed at 11/10/15 0501  Gross per 24 hour  Intake           1687.5 ml  Output              650 ml  Net           1037.5 ml    PULM  Normal effort , no use of accessory muscles CV  No JVD, RRR Abd      No distended, nontender VASC  Lower extremities are pink and warm. Well perfused there are trace pedal pulses noted  Laboratory CBC    Component Value Date/Time   WBC 7.7 11/10/2015 0351   HGB 7.7 (L) 11/10/2015 1456   HCT 22.4 (L) 11/10/2015 1456   PLT 198 11/10/2015 0351    BMET    Component Value Date/Time   NA 140 11/09/2015 0543   K 4.4 11/09/2015 0543   CL 113 (H) 11/09/2015 0543   CO2 23 11/09/2015 0543   GLUCOSE 82 11/09/2015 0543   BUN 33 (H) 11/09/2015 0543   CREATININE 1.73 (H) 11/09/2015 0543   CALCIUM 7.7 (L) 11/09/2015 0543   GFRNONAA 34 (L) 11/09/2015 0543   GFRAA 39 (L) 11/09/2015 0543    Assessment/Planning:  GI bleed localized to the sigmoid colon by nuclear scan: Given his recurrence I have once again spoken with the patient in detail regarding the process of embolization. His daughter was present. This gave her an opportunity to ask questions as well. The risks and benefits  as well as the alternatives were discussed. Following this discussion the patient has elected to move forward with embolization with the intention of stopping further GI bleeding. Arrangements will be made for later this afternoon.    Total length of time in discussion with the patient and his family was 45 minutes.    Jordan Kline, Jordan CraverGregory Kline  11/10/2015, 5:25 PM

## 2015-11-10 NOTE — Progress Notes (Signed)
Discharge paperwork reviewed with patient and his wife who verbalized understanding. Patient's family to transport home this afternoon.

## 2015-11-10 NOTE — Progress Notes (Addendum)
Dr. Juliene PinaMody notified patient HR increased on monitor 150's-170's irregular. Daily Metoprolol 100mg  dose given at 08:50am. Verbal order to restart diltiazem 120mg  daily. Will continue to monitor.

## 2015-11-10 NOTE — Discharge Summary (Addendum)
[facility] - [place] at [facility]   PATIENT NAME: Jordan Kline    MR#:  [id number]  DATE OF BIRTH:  07/26/1927  DATE OF ADMISSION:  11/08/2015 ADMITTING PHYSICIAN: Adrian Saran, MD  DATE OF DISCHARGE: 11/11/2015  PRIMARY CARE PHYSICIAN: Luna Fuse, MD    ADMISSION DIAGNOSIS:  Rectal bleeding [K62.5] GIB (gastrointestinal bleeding) [K92.2] Anemia, unspecified anemia type [D64.9]  DISCHARGE DIAGNOSIS:  Active Problems:   GIB (gastrointestinal bleeding)   Protein-calorie malnutrition, severe   SECONDARY DIAGNOSIS:   Past Medical History:  Diagnosis Date  . A-fib (HCC)   . Chronic kidney disease (CKD)   . High cholesterol   . Hypertension   . Inguinal hernia   . Stroke (HCC)   . Urinary retention     HOSPITAL COURSE:   82 year old male with a history of CVA with residual left-sided weakness and chronic atrial fibrillation on anticoagulation who presents with rectal bleeding.  1. Rectal bleeding: Prior to admission patient had had multiple episodes of bloody bowel movements. He underwent GI bleeding scan on admission which showed sigmoid bleed. vascular surgery was consulted. Embolization was discussed with the patient and his wife. Since the patient's hemoglobin had stabilized andd he had no bowel movements patient decided that he did not want to undergo embolization but rather observation. He was about to be discharged when He had a large bloody bowel movement. Vitals and hemoglobin remained relatively stable. At this time embolization was offered again by vascular surgery. Patient underwentmicrobead embolization of the sigmoidal branch of the inferior mesenteric artery with 1.5 cc of 500-700  polyvinyl alcohol beads.  His hemoglobin at discharge is stable at 7.6. He had a bowel movement with dark maroon blood which is indicative of old blood. Vitals are stable at discharge.  2. Acute blood loss anemia due to problem #1: Patient received 2  units of PRBCs. His hemoglobin remained stable. No indication for blood transfusion at this time.     3. History of chronic atrial fibrillation:Continue metoprolol for heart rate control. Anticoagulation has been discontinued due to problem #1.  4. History of hypertension: Patient will resume his outpatient medications which include metoprolol and diltiazem as well as ramipril.  5. Chronic urinary retention with chronic Foley catheter: Catheter was changed July 20. Next one is August 20. He is followed by urology. Continue tamsulosin and Hytrin.  6. History of CVA with residual left-sided weakness: Continue atorvastatin. Due to problem #1 I have discontinued aspirin for now.  DISCHARGE CONDITIONS AND DIET:   Stable for discharge on heart healthy diet  CONSULTS OBTAINED:  Treatment Team:  Renford Dills, MD Christena Deem, MD  DRUG ALLERGIES:  No Known Allergies  DISCHARGE MEDICATIONS:   Current Discharge Medication List    CONTINUE these medications which have CHANGED   Details  terazosin (HYTRIN) 2 MG capsule Take 1 capsule (2 mg total) by mouth at bedtime. Qty: 30 capsule, Refills: 0      CONTINUE these medications which have NOT CHANGED   Details  atorvastatin (LIPITOR) 80 MG tablet Take 80 mg by mouth every evening.     diltiazem (CARDIZEM CD) 120 MG 24 hr capsule Take 120 mg by mouth every evening.     docusate sodium (COLACE) 100 MG capsule Take 100 mg by mouth 2 (two) times daily.     ferrous sulfate 325 (65 FE) MG tablet Take 1 tablet (325 mg total) by mouth 2 (two) times daily with a meal. Qty: 60 tablet,  Refills: 0    lubiprostone (AMITIZA) 24 MCG capsule Take 24 mcg by mouth daily.     metoprolol (LOPRESSOR) 100 MG tablet Take 100 mg by mouth 2 (two) times daily.    Multiple Vitamin (MULTIVITAMIN WITH MINERALS) TABS tablet Take 1 tablet by mouth daily.    ramipril (ALTACE) 5 MG capsule Take 5 mg by mouth daily.    tamsulosin (FLOMAX) 0.4 MG  CAPS capsule Take 1 capsule (0.4 mg total) by mouth daily. Qty: 30 capsule, Refills: 3   Associated Diagnoses: BPH (benign prostatic hyperplasia)    triamterene-hydrochlorothiazide (DYAZIDE) 37.5-25 MG capsule Take 1 capsule by mouth daily. Only on Mondays, Wednesdays and Fridays.    finasteride (PROSCAR) 5 MG tablet Take 1 tablet (5 mg total) by mouth daily. Qty: 30 tablet, Refills: 3   Associated Diagnoses: BPH (benign prostatic hyperplasia)    oxybutynin (DITROPAN) 5 MG tablet Take 1 tablet (5 mg total) by mouth every 8 (eight) hours as needed for bladder spasms. Qty: 60 tablet, Refills: 0   Associated Diagnoses: Bladder spasms      STOP taking these medications     aspirin EC 81 MG tablet      rivaroxaban (XARELTO) 20 MG TABS tablet               Today   CHIEF COMPLAINT:   Patient doing very well this morning. Reports no dizziness, chest pain or shortness of breath.   VITAL SIGNS:  Blood pressure 124/90, pulse (!) 109, temperature 97.9 F (36.6 C), temperature source Oral, resp. rate 18, height 5\' 5"  (1.651 m), weight 53.1 kg (117 lb), SpO2 97 %.   REVIEW OF SYSTEMS:  Review of Systems  Constitutional: Negative.  Negative for chills, fever and malaise/fatigue.  HENT: Negative.  Negative for ear discharge, ear pain, hearing loss, nosebleeds and sore throat.   Eyes: Negative.  Negative for blurred vision and pain.  Respiratory: Negative.  Negative for cough, hemoptysis, shortness of breath and wheezing.   Cardiovascular: Negative.  Negative for chest pain, palpitations and leg swelling.  Gastrointestinal: Negative.  Negative for abdominal pain, blood in stool, diarrhea, nausea and vomiting.  Genitourinary: Negative.  Negative for dysuria.  Musculoskeletal: Negative.  Negative for back pain.  Skin: Negative.   Neurological: Negative for dizziness, tremors, speech change, focal weakness, seizures and headaches.  Endo/Heme/Allergies: Negative.  Does not  bruise/bleed easily.  Psychiatric/Behavioral: Negative.  Negative for depression, hallucinations and suicidal ideas.     PHYSICAL EXAMINATION:  GENERAL:  82 y.o.-year-old patient lying in the bed with no acute distress.  NECK:  Supple, no jugular venous distention. No thyroid enlargement, no tenderness.  LUNGS: Normal breath sounds bilaterally, no wheezing, rales,rhonchi  No use of accessory muscles of respiration.  CARDIOVASCULAR: He regular, irregular with heart rate around 110. No murmurs, rubs, or gallops.  ABDOMEN: Soft, non-tender, non-distended. Bowel sounds present. No organomegaly or mass.  EXTREMITIES: No pedal edema, cyanosis, or clubbing.  Left lower extremity 4-5 with foot drop which is old from previous CVA. PSYCHIATRIC: The patient is alert and oriented x 3.  SKIN: No obvious rash, lesion, or ulcer.   DATA REVIEW:   CBC  Recent Labs Lab 11/11/15 0353  WBC 8.9  HGB 7.6*  HCT 22.5*  PLT 192    Chemistries   Recent Labs Lab 11/08/15 1121  11/11/15 0353  NA 139  < > 142  K 4.4  < > 4.1  CL 107  < > 113*  CO2 24  < >  23  GLUCOSE 181*  < > 87  BUN 36*  < > 27*  CREATININE 1.83*  < > 1.49*  CALCIUM 8.4*  < > 7.8*  AST 22  --   --   ALT 17  --   --   ALKPHOS 57  --   --   BILITOT 0.3  --   --   < > = values in this interval not displayed.  Cardiac Enzymes No results for input(s): TROPONINI in the last 168 hours.  Microbiology Results  @[email or web address] @  RADIOLOGY:  No results found.    Management plans discussed with the patient and he is in agreement. [facility]  Patient should follow up with PCP   CODE STATUS:     Code Status Orders        Start     Ordered   11/08/15 1557  Do not attempt resuscitation (DNR)  Continuous    Question Answer Comment  In the event of cardiac or respiratory ARREST Do not call a "code blue"   In the event of cardiac or respiratory ARREST Do not perform Intubation, CPR, defibrillation  or ACLS   In the event of cardiac or respiratory ARREST Use medication by any route, position, wound care, and other measures to relive pain and suffering. May use oxygen, suction and manual treatment of airway obstruction as needed for comfort.      11/08/15 1556    Code Status History    Date Active Date Inactive Code Status Order ID Comments User Context   03/01/2015  5:29 PM 03/04/2015  7:07 PM DNR [id number]  Altamese DillingVaibhavkumar Vachhani, MD Inpatient      TOTAL TIME TAKING CARE OF THIS PATIENT: 36 minutes.    Note: This dictation was prepared with Dragon dictation along with smaller phrase technology. Any transcriptional errors that result from this process are unintentional.  Kolton Kienle M.D on 11/11/2015 at 11:05 AM  Between 7am to 6pm - Pager - [phone] After 6pm go to [email or web address] - Social research officer, governmentpassword [email or web address]  [facility] [place] [facility]  Office  [phone]  CC: Primary care physician; Luna FuseEJAN-SIE, SHEIKH AHMED, MD

## 2015-11-10 NOTE — Progress Notes (Signed)
[facility].

## 2015-11-10 NOTE — Op Note (Signed)
[facility] Percutaneous Study/Intervention Procedural Note     Surgeon(s): Avana Kreiser  Assistants: none  Pre-operative Diagnosis: 1. Lower GI bleed 2. Acute blood loss anemia  Post-operative diagnosis: Same  Procedure(s) Performed: 1. Ultrasound guidance for vascular access right femoral artery 2. Catheter placement into sigmoidal branch of inferior mesenteric artery from right femoral approach 3. Aortogram and selective angiogram of the of the IMA in its main artery and the sigmoidal branch 4. Microbead embolization of the sigmoidal branch of the inferior mesenteric artery with 1.5 cc of 500-700  polyvinyl alcohol beads. 5. StarClose closure device right femoral artery  Anesthesia: Moderate conscious sedation for approximately 20 minutes using 2 mg of Versed and 50 mcg of Fentanyl  EBL: 25 cc  Fluoro Time: 4.7 minutes  Contrast: 40 cc  Indications: Patient is a  with brisk lower GI bleeding with resultant anemia. The patient has a nuclear medicine study showing acute bleeding with active bleeding seen in the sigmoid colon area. The patient is brought in for angiography for further evaluation and potential treatment. Risks and benefits are discussed and informed consent is obtained  Procedure: The patient was identified and appropriate procedural time out was performed. The patient was then placed supine on the table and prepped and draped in the usual sterile fashion.Moderate conscious sedation was administered during a face to face encounter with the patient throughout the procedure with my supervision of the RN administering medicines and monitoring the patient's vital signs, pulse oximetry, telemetry and mental status throughout from the start of the procedure until the patient was taken to the recovery room.  Ultrasound was used to evaluate the right common  femoral artery. It was patent . A digital ultrasound image was acquired. A Seldinger needle was used to access the right common femoral artery under direct ultrasound guidance and a permanent image was performed. A 0.035 J wire was advanced without resistance and a 5Fr sheath was placed. Pigtail catheter was placed into the aorta and a RAO projection aortogram was performed. This demonstrated normal renal arteries with normal aorta and iliac arteries. The IMA takeoff had at least a moderate stenosis. A VS 1 catheter was used to selectively cannulate the IMA.  This demonstrated brisk flow with what appeared to be active bleeding in early blush in the sigmoid colon. Based on his continued bleeding and the nuclear medicine study I elected to treat this area with embolization. I initially advanced the Pro-Great microcatheter out the IMA and into the sigmoidal branch of the inferior mesenteric artery beyond its primary bifurcation. Intraluminal flow was confirmed in the sigmoidal branch of the IMA and I elected to proceed with treatment. I instilled approximately 1 cc of 500-700  polyvinyl alcohol beads in this location initially. The filling was less brisk, but there was still an early blush. Another 0.5 cc of 500-700  polyvinyl alcohol beads were then instilled into the sigmoidal branch of the IMA. Angiogram following this showed the main vessels to be open with less brisk filling. I felt we had likely markedly improved the situation but still left enough blood flow so that significant ischemia would not develop. I elected to terminate the procedure. The diagnostic catheter was removed. StarClose closure device was deployed in usual fashion with excellent hemostatic result. The patient was taken to the recovery room in stable condition having tolerated the procedure well.     Findings: Patent renal arteries, normal aorta and iliac segments. Moderate stenosis of the IMA origin but brisk  flow beyond this  with what appeared to be a blush and brisk return filling within the sigmoidal branch of the IMA. This was then treated with embolization.  Disposition: Patient was taken to the recovery room in stable condition having tolerated the procedure well.  Complications: None  Shawanna Zanders 11/10/2015 7:20 PM

## 2015-11-10 NOTE — Care Management (Signed)
Discharge to home today per Dr. Juliene PinaMody. Will resume home health services through [facility]. Chronic foley in place. Family will transport. Gwenette GreetBrenda S Roth Ress RN MSN CCM Care management   [phone]

## 2015-11-11 ENCOUNTER — Encounter: Payer: Self-pay | Admitting: Vascular Surgery

## 2015-11-11 LAB — CBC
HCT: 22.5 % — ABNORMAL LOW (ref 40.0–52.0)
Hemoglobin: 7.6 g/dL — ABNORMAL LOW (ref 13.0–18.0)
MCH: 29.2 pg (ref 26.0–34.0)
MCHC: 33.8 g/dL (ref 32.0–36.0)
MCV: 86.2 fL (ref 80.0–100.0)
Platelets: 192 K/uL (ref 150–440)
RBC: 2.61 MIL/uL — ABNORMAL LOW (ref 4.40–5.90)
RDW: 18.5 % — ABNORMAL HIGH (ref 11.5–14.5)
WBC: 8.9 K/uL (ref 3.8–10.6)

## 2015-11-11 LAB — GLUCOSE, CAPILLARY
GLUCOSE-CAPILLARY: 87 mg/dL (ref 65–99)
Glucose-Capillary: 81 mg/dL (ref 65–99)

## 2015-11-11 LAB — BASIC METABOLIC PANEL
ANION GAP: 6 (ref 5–15)
BUN: 27 mg/dL — ABNORMAL HIGH (ref 6–20)
CALCIUM: 7.8 mg/dL — AB (ref 8.9–10.3)
CO2: 23 mmol/L (ref 22–32)
Chloride: 113 mmol/L — ABNORMAL HIGH (ref 101–111)
Creatinine, Ser: 1.49 mg/dL — ABNORMAL HIGH (ref 0.61–1.24)
GFR, EST AFRICAN AMERICAN: 47 mL/min — AB (ref >60)
GFR, EST NON AFRICAN AMERICAN: 41 mL/min — AB (ref >60)
Glucose, Bld: 87 mg/dL (ref 65–99)
POTASSIUM: 4.1 mmol/L (ref 3.5–5.1)
SODIUM: 142 mmol/L (ref 135–145)

## 2015-11-11 LAB — HEMOGLOBIN AND HEMATOCRIT, BLOOD
HEMATOCRIT: 22.7 % — AB (ref 40.0–52.0)
HEMOGLOBIN: 7.7 g/dL — AB (ref 13.0–18.0)

## 2015-11-11 MED ORDER — TERAZOSIN HCL 2 MG PO CAPS: 2.0000 mg | ORAL_CAPSULE | Freq: Every day | ORAL | 0 refills | Status: DC

## 2015-11-11 NOTE — Progress Notes (Addendum)
Patient is alert and oriented, bed bound at this time, requesting bedpan, bm prior to start of shift, chronic foley in place, diet advanced from liquid to soft, patient tolerating diet, denies n/v, on room air, wife updated at bedside,patient to be transported via EMS, hgb stable at 7.6, denies pain, awaiting on son to assist with transfer once patient arrives home. Pt's wife verbalizes understanding of d/c instructions and has no further questions at this time. Pt d/c to home with home health at approx. 16:40, pushed to visitor entrance via nursing staff.

## 2015-11-11 NOTE — Progress Notes (Signed)
Per Dr MOdy, tele can be discontinued and diet advanced to soft, verbal in person. MD notified that patient has had 2 maroon colored stools since yesterday and is passing a lot of gas. MD acknowledged.

## 2015-11-11 NOTE — Progress Notes (Signed)
[facility]  Daily Progress Note   Subjective: 1 Day Post-Op: Ultrasound guidance for vascular access right femoral artery, Catheter placement into sigmoidal branch of inferior mesenteric artery from right femoral approach, Aortogram and selective angiogram of the of the IMA in its main artery and the sigmoidal branch, Microbead embolization of the sigmoidal branch of the inferior mesenteric artery with 1.5 cc of 500-700  polyvinyl alcohol beads with StarClose closure device right femoral artery.  Patient resting comfortably in bed without complaint this afternoon. Patient passing gas and having non-bloody bowel movements. Tolerating a soft diet.   Objective: Vitals:   11/10/15 2000 11/10/15 2026 11/11/15 0516 11/11/15 1313  BP: (!) 134/91 (!) 136/96 124/90 109/67  Pulse: 91 95 (!) 109 79  Resp: 19 18 18 18   Temp:  97 F (36.1 C) 97.9 F (36.6 C) 98.7 F (37.1 C)  TempSrc:  Axillary Oral Oral  SpO2: 100% 100% 97% 100%  Weight:      Height:        Intake/Output Summary (Last 24 hours) at 11/11/15 1436 Last data filed at 11/11/15 1130  Gross per 24 hour  Intake              240 ml  Output             1400 ml  Net            -1160 ml   Physical Exam: A&Ox3, NAD CV: RRR Pulmonary: CTA Bilaterally Abdomen: Soft, Nontender, Nondistended Right Groin: OR dressing intact, clean and dry. Urology: Foley intact, draining clear urine Vascular: Bilaterally warm, non-tender, minimal edema   Laboratory: CBC    Component Value Date/Time   WBC 8.9 11/11/2015 0353   HGB 7.6 (L) 11/11/2015 0353   HCT 22.5 (L) 11/11/2015 0353   PLT 192 11/11/2015 0353   BMET    Component Value Date/Time   NA 142 11/11/2015 0353   K 4.1 11/11/2015 0353   CL 113 (H) 11/11/2015 0353   CO2 23 11/11/2015 0353   GLUCOSE 87 11/11/2015 0353   BUN 27 (H) 11/11/2015 0353   CREATININE 1.49 (H) 11/11/2015 0353   CALCIUM 7.8 (L) 11/11/2015 0353   GFRNONAA 41 (L) 11/11/2015 0353   GFRAA 47 (L) 11/11/2015 0353   Assessment/Planning: The patient is a 82 year old male with a known history of atrial fibrillation on anticoagulation and chronic kidney disease stage III who presented with GI bleed s/p Microbead embolization of the sigmoidal branch of the inferior mesenteric artery - stable 1) H&H stable 2) Diet being advanced 3) Tolerating a diet, passing gas and having bowel movements 4) Being discharged by primary team  Cleda DaubKimberly Marlis Oldaker PA-C 11/11/2015 2:36 PM

## 2015-11-12 ENCOUNTER — Telehealth: Payer: Self-pay | Admitting: Urology

## 2015-11-12 NOTE — Telephone Encounter (Signed)
[facility] called with a question about patient's medication.  The ED physician's discharge note says to continue Tamsulosin and Hytrin.  The pharmacy states that it is double therapy and insurance will not pay. She is asking for advice on how to proceed.  [facility] = [phone]

## 2015-11-12 NOTE — Telephone Encounter (Signed)
The terazosin is prescribed for his BP and the tamsulosin is prescribed for his BPH.  My suggestion is for the patient to contact the provider that is managing his BP and see if they are willing to increase the terazosin and then we can d/c the tamsulosin or change the terazosin to another BP medication and continue the tamsulosin.

## 2015-11-15 NOTE — Telephone Encounter (Signed)
Spoke with pt wife in reference to medications. Made aware Carollee HerterShannon suggest call PCP to see if terazosin can be increased and tamsulosin stopped. Wife voiced understanding of whole conversation.

## 2015-11-16 ENCOUNTER — Telehealth: Payer: Self-pay | Admitting: Urology

## 2015-11-16 NOTE — Telephone Encounter (Signed)
Spoke with pt PCP and home health nurse in reference to hytrin. Both, PCP and home health nurse, stated that pt was started on hytrin in the ER when present for a GI bleed on 11/08/15. Prior to ER visit pt was taking tamsulosin and finasteride. Please advise.

## 2015-11-16 NOTE — Telephone Encounter (Signed)
Created in error

## 2015-11-16 NOTE — Telephone Encounter (Signed)
Pt wife called, stated she called PCP as advised to do so, PCP office told wife to call [facility], Pt wife is confused and has more questions. Please advise.

## 2015-11-16 NOTE — Telephone Encounter (Signed)
I'd say discontinue the Hytrin and continue the tamsulosin and finasteride.

## 2015-11-16 NOTE — Telephone Encounter (Signed)
Spoke with pt wife in reference to taking tamsulosin and finasteride. Wife voiced understanding.

## 2015-11-19 ENCOUNTER — Other Ambulatory Visit: Payer: Self-pay

## 2015-11-19 DIAGNOSIS — N400 Enlarged prostate without lower urinary tract symptoms: Secondary | ICD-10-CM

## 2015-11-19 MED ORDER — TAMSULOSIN HCL 0.4 MG PO CAPS: 0.4000 mg | ORAL_CAPSULE | Freq: Every day | ORAL | 3 refills | Status: DC

## 2015-11-24 ENCOUNTER — Telehealth: Payer: Self-pay | Admitting: *Deleted

## 2015-11-24 NOTE — Telephone Encounter (Signed)
Patient's wife calling to state she is still having trouble trying to get his tamsulosin filled. [facility] that there needed to be a prior auth and that they are waiting on it. Tried to look for for prior auth from pharmacy at our office could not find one called pharmacy to resend it.

## 2016-02-12 ENCOUNTER — Emergency Department: Payer: Medicare HMO

## 2016-02-12 ENCOUNTER — Emergency Department
Admission: EM | Admit: 2016-02-12 | Discharge: 2016-02-12 | Disposition: A | Discharge status: Home / Self Care | Payer: Medicare HMO | Attending: Emergency Medicine | Admitting: Emergency Medicine

## 2016-02-12 DIAGNOSIS — T83098A Other mechanical complication of other indwelling urethral catheter, initial encounter: Secondary | ICD-10-CM | POA: Insufficient documentation

## 2016-02-12 DIAGNOSIS — T83511A Infection and inflammatory reaction due to indwelling urethral catheter, initial encounter: Secondary | ICD-10-CM

## 2016-02-12 DIAGNOSIS — Z87891 Personal history of nicotine dependence: Secondary | ICD-10-CM | POA: Insufficient documentation

## 2016-02-12 DIAGNOSIS — N189 Chronic kidney disease, unspecified: Secondary | ICD-10-CM | POA: Diagnosis not present

## 2016-02-12 DIAGNOSIS — N390 Urinary tract infection, site not specified: Secondary | ICD-10-CM | POA: Diagnosis not present

## 2016-02-12 DIAGNOSIS — M1612 Unilateral primary osteoarthritis, left hip: Secondary | ICD-10-CM | POA: Insufficient documentation

## 2016-02-12 DIAGNOSIS — Z79899 Other long term (current) drug therapy: Secondary | ICD-10-CM | POA: Insufficient documentation

## 2016-02-12 DIAGNOSIS — M25552 Pain in left hip: Secondary | ICD-10-CM

## 2016-02-12 DIAGNOSIS — Y69 Unspecified misadventure during surgical and medical care: Secondary | ICD-10-CM | POA: Diagnosis not present

## 2016-02-12 DIAGNOSIS — I129 Hypertensive chronic kidney disease with stage 1 through stage 4 chronic kidney disease, or unspecified chronic kidney disease: Secondary | ICD-10-CM | POA: Insufficient documentation

## 2016-02-12 LAB — URINALYSIS COMPLETE WITH MICROSCOPIC (ARMC ONLY)
BILIRUBIN URINE: NEGATIVE
GLUCOSE, UA: NEGATIVE mg/dL
Ketones, ur: NEGATIVE mg/dL
NITRITE: NEGATIVE
Protein, ur: 100 mg/dL — AB
SQUAMOUS EPITHELIAL / LPF: NONE SEEN
Specific Gravity, Urine: 1.013 (ref 1.005–1.030)
pH: 6 (ref 5.0–8.0)

## 2016-02-12 MED ORDER — ONDANSETRON HCL 4 MG/2ML IJ SOLN
4.0000 mg | Freq: Once | INTRAMUSCULAR | Status: AC
Start: 2016-02-12 — End: 2016-02-12
  Administered 2016-02-12: 4 mg via INTRAVENOUS

## 2016-02-12 MED ORDER — KETOROLAC TROMETHAMINE 30 MG/ML IJ SOLN
30.0000 mg | Freq: Once | INTRAMUSCULAR | Status: AC
  Administered 2016-02-12: 30 mg via INTRAVENOUS
  Filled 2016-02-12: qty 1

## 2016-02-12 MED ORDER — OXYCODONE-ACETAMINOPHEN 5-325 MG PO TABS: 1.0000 | ORAL_TABLET | ORAL | 0 refills | Status: DC | PRN

## 2016-02-12 MED ORDER — MORPHINE SULFATE (PF) 4 MG/ML IV SOLN
2.0000 mg | Freq: Once | INTRAVENOUS | Status: AC
  Administered 2016-02-12: 2 mg via INTRAVENOUS

## 2016-02-12 MED ORDER — ONDANSETRON HCL 4 MG/2ML IJ SOLN
INTRAMUSCULAR | Status: AC
  Filled 2016-02-12: qty 2

## 2016-02-12 MED ORDER — CEPHALEXIN 500 MG PO CAPS
500.0000 mg | ORAL_CAPSULE | Freq: Two times a day (BID) | ORAL | 0 refills | Status: AC
Start: 2016-02-12 — End: 2016-02-22

## 2016-02-12 MED ORDER — MORPHINE SULFATE (PF) 2 MG/ML IV SOLN
INTRAVENOUS | Status: AC
  Filled 2016-02-12: qty 1

## 2016-02-12 MED ORDER — SODIUM CHLORIDE 0.9 % IV BOLUS (SEPSIS)
1000.0000 mL | Freq: Once | INTRAVENOUS | Status: AC
  Administered 2016-02-12: 1000 mL via INTRAVENOUS

## 2016-02-12 MED ORDER — CEPHALEXIN 500 MG PO CAPS
500.0000 mg | ORAL_CAPSULE | Freq: Once | ORAL | Status: AC
  Administered 2016-02-12: 500 mg via ORAL
  Filled 2016-02-12: qty 1

## 2016-02-12 NOTE — ED Provider Notes (Signed)
[facility] Emergency Department Provider Note  ____________________________________________  Time seen: 5:45 AM  I have reviewed the triage vital signs and the nursing notes.   HISTORY  Chief Complaint Hip Pain (left)    HPI Jordan Kline is a 82 y.o. male presents with chronic left hip pain since stroke which occurred in 2006. Patient now presents with 7 out of 10 nontraumatic left hip pain. Patient states that his pain is usually improved/resolved with Tylenol at home however was unsuccessful with Tylenol today. Patient has an indwelling catheter secondary to enlarged prostate per his wife. Patient admits to urinary discomfort. Patient's wife who is an Charity fundraiserN states that she's noticed discoloration of the urine     Past Medical History:  Diagnosis Date  . A-fib (HCC)   . Chronic kidney disease (CKD)   . High cholesterol   . Hypertension   . Inguinal hernia   . Stroke (HCC)   . Urinary retention     Patient Active Problem List   Diagnosis Date Noted  . Protein-calorie malnutrition, severe 11/09/2015  . GIB (gastrointestinal bleeding) 11/08/2015  . Recurrent UTI 07/12/2015  . UTI (lower urinary tract infection) 03/01/2015  . Urinary retention 03/01/2015    Past Surgical History:  Procedure Laterality Date  . APPENDECTOMY    . PERIPHERAL VASCULAR CATHETERIZATION N/A 11/10/2015   Procedure: Visceral Angiography, mesenteric angio;  Surgeon: Annice NeedyJason S Dew, MD;  Location: [facility] INVASIVE CV LAB;  Service: Cardiovascular;  Laterality: N/A;  . PERIPHERAL VASCULAR CATHETERIZATION N/A 11/10/2015   Procedure: Visceral Artery Intervention;  Surgeon: Annice NeedyJason S Dew, MD;  Location: [facility] INVASIVE CV LAB;  Service: Cardiovascular;  Laterality: N/A;    Current Outpatient Rx  . Order #: [id number] Class: Historical Med  . Order #: [id number] Class: Historical Med  . Order #: [id number] Class: Historical Med  . Order #: [id number] Class: Normal  . Order #: [id number] Class: Fax   . Order #: [id number] Class: Historical Med  . Order #: [id number] Class: Historical Med  . Order #: [id number] Class: Historical Med  . Order #: [id number] Class: Fax  . Order #: [id number] Class: Historical Med  . Order #: [id number] Class: Fax  . Order #: [id number] Class: Normal  . Order #: [id number] Class: Historical Med    Allergies No known drug allergies  Family History  Problem Relation Age of Onset  . Hypertension Father   . Kidney cancer Neg Hx   . Kidney disease Neg Hx   . Prostate cancer Neg Hx     Social History Social History  Substance Use Topics  . Smoking status: Former Games developermoker  . Smokeless tobacco: Never Used  . Alcohol use No    Review of Systems  Constitutional: Negative for fever. Eyes: Negative for visual changes. ENT: Negative for sore throat. Cardiovascular: Negative for chest pain. Respiratory: Negative for shortness of breath. Gastrointestinal: Negative for abdominal pain, vomiting and diarrhea. Genitourinary: Negative for dysuria. Musculoskeletal: Negative for back pain.Positive for left hip pain Skin: Negative for rash. Neurological: Negative for headaches, focal weakness or numbness.   10-point ROS otherwise negative.  ____________________________________________   PHYSICAL EXAM:  VITAL SIGNS: ED Triage Vitals  Enc Vitals Group     BP 02/12/16 0541 (!) 164/109     Pulse Rate 02/12/16 0541 (!) 103     Resp 02/12/16 0541 18     Temp 02/12/16 0541 98.2 F (36.8 C)     Temp Source 02/12/16 0541 Oral     SpO2 02/12/16 0539 97 %     Weight 02/12/16 0542  155 lb (70.3 kg)     Height 02/12/16 0542 5\' 6"  (1.676 m)     Head Circumference --      Peak Flow --      Pain Score 02/12/16 0542 7     Pain Loc --      Pain Edu? --      Excl. in GC? --      Constitutional: Alert and oriented. Well appearing and in no distress. Eyes: Conjunctivae are normal. PERRL. Normal extraocular movements. ENT   Head: Normocephalic and atraumatic.   Nose:  No congestion/rhinnorhea.   Mouth/Throat: Mucous membranes are moist.   Neck: No stridor. Hematological/Lymphatic/Immunilogical: No cervical lymphadenopathy. Cardiovascular: Normal rate, regular rhythm. Normal and symmetric distal pulses are present in all extremities. No murmurs, rubs, or gallops. Respiratory: Normal respiratory effort without tachypnea nor retractions. Breath sounds are clear and equal bilaterally. No wheezes/rales/rhonchi. Gastrointestinal: Soft and nontender. No distention. There is no CVA tenderness. Genitourinary: deferred Musculoskeletal: Left hip pain with passive and active range of motion. Neurologic:  Normal speech and language. No gross focal neurologic deficits are appreciated. Speech is normal.  Skin:  Skin is warm, dry and intact. No rash noted. Psychiatric: Mood and affect are normal. Speech and behavior are normal. Patient exhibits appropriate insight and judgment.  ____________________________________________    LABS (pertinent positives/negatives)  Labs Reviewed  URINALYSIS COMPLETEWITH MICROSCOPIC ([facility] ONLY) - Abnormal; Notable for the following:       Result Value   Color, Urine RED (*)    APPearance CLOUDY (*)    Hgb urine dipstick 3+ (*)    Protein, ur 100 (*)    Leukocytes, UA 3+ (*)    Bacteria, UA MANY (*)    All other components within normal limits  URINE CULTURE       RADIOLOGY  CLINICAL DATA:  Acute on chronic left hip pain.  EXAM: DG HIP (WITH OR WITHOUT PELVIS) 2-3V LEFT  COMPARISON:  None.  FINDINGS: There is no evidence of hip fracture or dislocation. There is no evidence of arthropathy or other focal bone abnormality.  Degenerative changes on the superior acetabula.  Osteopenia and atherosclerosis. Moderately distended urinary bladder.  IMPRESSION: No acute finding.  Procedures     INITIAL IMPRESSION / ASSESSMENT AND PLAN / ED COURSE  Pertinent labs & imaging results that were available  during my care of the patient were reviewed by me and considered in my medical decision making (see chart for details).    ____________________________________________   FINAL CLINICAL IMPRESSION(S) / ED DIAGNOSES  Final diagnoses:  Left hip pain  Arthritis of left hip  Urinary tract infection associated with catheterization of urinary tract, unspecified indwelling urinary catheter type, initial encounter [facility](HCC)      Darci Currentandolph N Aerianna Losey, MD 02/12/16 [phone]

## 2016-02-12 NOTE — ED Triage Notes (Signed)
Pt c/o acute on chronic left hip pain rated at 7 out of 10. Pt has hx of fall to left hip when he had his stroke in 2006. Pt reports hx of stroke, hypertension and afib.

## 2016-02-14 LAB — URINE CULTURE

## 2016-02-29 ENCOUNTER — Telehealth: Payer: Self-pay

## 2016-02-29 DIAGNOSIS — N401 Enlarged prostate with lower urinary tract symptoms: Secondary | ICD-10-CM

## 2016-02-29 NOTE — Telephone Encounter (Signed)
Pt home health nurse is requesting a refill of finasteride. Pt was last seen in may. Does he need to be seen again?

## 2016-03-01 MED ORDER — FINASTERIDE 5 MG PO TABS: 5.0000 mg | ORAL_TABLET | Freq: Every day | ORAL | 3 refills | Status: DC

## 2016-03-01 NOTE — Telephone Encounter (Signed)
Who has been changing his catheters?

## 2016-03-01 NOTE — Telephone Encounter (Signed)
That is fine to refill his finasteride.

## 2016-03-01 NOTE — Telephone Encounter (Signed)
Medication sent to pharmacy  

## 2016-03-01 NOTE — Telephone Encounter (Signed)
Home health

## 2016-04-03 ENCOUNTER — Other Ambulatory Visit: Payer: Self-pay

## 2016-04-03 DIAGNOSIS — N401 Enlarged prostate with lower urinary tract symptoms: Secondary | ICD-10-CM

## 2016-04-03 MED ORDER — TAMSULOSIN HCL 0.4 MG PO CAPS: 0.4000 mg | ORAL_CAPSULE | Freq: Every day | ORAL | 0 refills | Status: DC

## 2016-07-10 ENCOUNTER — Other Ambulatory Visit: Payer: Self-pay

## 2016-07-10 DIAGNOSIS — N401 Enlarged prostate with lower urinary tract symptoms: Secondary | ICD-10-CM

## 2016-07-10 MED ORDER — FINASTERIDE 5 MG PO TABS: 5.0000 mg | ORAL_TABLET | Freq: Every day | ORAL | 0 refills | Status: DC

## 2016-07-10 NOTE — Progress Notes (Signed)
Pt pharmacy sent a refill request of finasteride. 30 days and no refills given. Pt needs to be seen in May.

## 2016-07-28 ENCOUNTER — Other Ambulatory Visit: Payer: Self-pay | Admitting: Urology

## 2016-07-28 DIAGNOSIS — N401 Enlarged prostate with lower urinary tract symptoms: Secondary | ICD-10-CM

## 2016-08-08 ENCOUNTER — Other Ambulatory Visit: Payer: Self-pay | Admitting: Urology

## 2016-08-08 DIAGNOSIS — N401 Enlarged prostate with lower urinary tract symptoms: Secondary | ICD-10-CM

## 2016-08-18 ENCOUNTER — Other Ambulatory Visit: Payer: Self-pay | Admitting: Internal Medicine

## 2016-08-18 ENCOUNTER — Ambulatory Visit
Admission: RE | Admit: 2016-08-18 | Discharge: 2016-08-18 | Disposition: A | Discharge status: Home / Self Care | Payer: Medicare HMO | Source: Ambulatory Visit | Attending: Internal Medicine | Admitting: Internal Medicine

## 2016-08-18 DIAGNOSIS — M7989 Other specified soft tissue disorders: Secondary | ICD-10-CM

## 2016-08-18 DIAGNOSIS — R2242 Localized swelling, mass and lump, left lower limb: Secondary | ICD-10-CM | POA: Diagnosis not present

## 2016-08-18 DIAGNOSIS — M79604 Pain in right leg: Secondary | ICD-10-CM

## 2016-08-21 NOTE — Progress Notes (Signed)
3:11 PM   Jordan PennaDouglas Kline 1927/09/12 [id number]  Referring provider: [name], S Ahmed, MD [address] [place], [place] [id number]  Chief Complaint  Patient presents with  . Medication Refill    last seen 08/12/2015    HPI: Patient is an [age] year old African-American male who presents today for yearly follow up with his sister, [name].    Background history Patient was seen in the emergency department on 03/02/15 for acute urinary retention. He was being treated for urinary tract infection at that time though no initial culture was sent. Approximately 600 mL's was drained from his bladder upon Foley insertion. Urology was consult at that time and patient was seen by Dr. Marlou PorchHerrick. Recommendations for patient to start once daily Flomax, completion of antibiotics and to follow up with us as an outpatient in one week for a voiding trial. He failed the voiding trial, family was not comfortable with [facility] and patient refuses SPT placement.  Creatinine was also noticed to noted to be significantly elevated prior to Foley catheter placement at 2.49 after bladder decompression creatinine decreased to 1.75.  Most recent creatinine was 1.49 in August 2017.    Baseline urinary symptoms including weak stream, incomplete bladder emptying, urinary frequency, and nocturia. Prior to recent ED visit patient had not been seen by urologist and was not taking any medications for treatment of his urinary symptoms.  Current Status: Patient states he is tolerating the Foley well.  He has been having good output.   The urine has not been bloody or cloudy.  He does not complain of bladder spasms or suprapubic pain. He has not had any recent fevers, chills, nausea or vomiting.  Foley catheter has been changed monthly by home health.  We discussed the discontinuation of the tamsulosin and the finasteride at this time.  They are agreeable.  PMH: Past Medical History:  Diagnosis Date  . A-fib (HCC)   . Chronic kidney  disease (CKD)   . High cholesterol   . Hypertension   . Inguinal hernia   . Stroke (HCC)   . Urinary retention     Surgical History: Past Surgical History:  Procedure Laterality Date  . APPENDECTOMY    . PERIPHERAL VASCULAR CATHETERIZATION N/A 11/10/2015   Procedure: Visceral Angiography, mesenteric angio;  Surgeon: Annice NeedyJason S Dew, MD;  Location: [facility] INVASIVE CV LAB;  Service: Cardiovascular;  Laterality: N/A;  . PERIPHERAL VASCULAR CATHETERIZATION N/A 11/10/2015   Procedure: Visceral Artery Intervention;  Surgeon: Annice NeedyJason S Dew, MD;  Location: [facility] INVASIVE CV LAB;  Service: Cardiovascular;  Laterality: N/A;    Home Medications:  Allergies as of 08/22/2016   No Known Allergies     Medication List       Accurate as of 08/22/16  3:11 PM. Always use your most recent med list.          atorvastatin 80 MG tablet Commonly known as:  LIPITOR Take 80 mg by mouth every evening.   diltiazem 120 MG 24 hr capsule Commonly known as:  CARDIZEM CD Take 120 mg by mouth every evening.   docusate sodium 100 MG capsule Commonly known as:  COLACE Take 100 mg by mouth 2 (two) times daily.   doxycycline 100 MG capsule Commonly known as:  VIBRAMYCIN Take 100 mg by mouth 2 (two) times daily.   ferrous sulfate 325 (65 FE) MG tablet Take 1 tablet (325 mg total) by mouth 2 (two) times daily with a meal.   finasteride 5 MG tablet Commonly known  as:  PROSCAR Take 1 tablet (5 mg total) by mouth daily.   lubiprostone 24 MCG capsule Commonly known as:  AMITIZA Take 24 mcg by mouth daily.   metoprolol tartrate 100 MG tablet Commonly known as:  LOPRESSOR Take 100 mg by mouth 2 (two) times daily.   multivitamin with minerals Tabs tablet Take 1 tablet by mouth daily.   oxybutynin 5 MG tablet Commonly known as:  DITROPAN Take 1 tablet (5 mg total) by mouth every 8 (eight) hours as needed for bladder spasms.   oxyCODONE-acetaminophen 5-325 MG tablet Commonly known as:  ROXICET Take 1 tablet  by mouth every 4 (four) hours as needed for severe pain.   ramipril 5 MG capsule Commonly known as:  ALTACE Take 5 mg by mouth daily.   Rivaroxaban 15 MG Tabs tablet Commonly known as:  XARELTO Take 15 mg by mouth 2 (two) times daily with a meal.   tamsulosin 0.4 MG Caps capsule Commonly known as:  FLOMAX Take 1 capsule (0.4 mg total) by mouth daily.   terazosin 2 MG capsule Commonly known as:  HYTRIN Take 1 capsule (2 mg total) by mouth at bedtime.   triamterene-hydrochlorothiazide 37.5-25 MG capsule Commonly known as:  DYAZIDE Take 1 capsule by mouth daily. Only on Mondays, Wednesdays and Fridays.       Allergies: No Known Allergies  Family History: Family History  Problem Relation Age of Onset  . Hypertension Father   . Kidney cancer Neg Hx   . Kidney disease Neg Hx   . Prostate cancer Neg Hx     Social History:  reports that he has quit smoking. He has never used smokeless tobacco. He reports that he does not drink alcohol or use drugs.  ROS: UROLOGY Frequent Urination?: No Hard to postpone urination?: No Burning/pain with urination?: No Get up at night to urinate?: No Leakage of urine?: No Urine stream starts and stops?: No Trouble starting stream?: No Do you have to strain to urinate?: No Blood in urine?: No Urinary tract infection?: No Sexually transmitted disease?: No Injury to kidneys or bladder?: No Painful intercourse?: No Weak stream?: No Erection problems?: No Penile pain?: No  Gastrointestinal Nausea?: No Vomiting?: No Indigestion/heartburn?: No Diarrhea?: No Constipation?: No  Constitutional Fever: No Night sweats?: No Weight loss?: No Fatigue?: No  Skin Skin rash/lesions?: No Itching?: No  Eyes Blurred vision?: No Double vision?: No  Ears/Nose/Throat Sore throat?: No Sinus problems?: No  Hematologic/Lymphatic Swollen glands?: No Easy bruising?: No  Cardiovascular Leg swelling?: No Chest pain?:  No  Respiratory Cough?: No Shortness of breath?: No  Endocrine Excessive thirst?: No  Musculoskeletal Back pain?: No Joint pain?: Yes  Neurological Headaches?: No Dizziness?: No  Psychologic Depression?: No Anxiety?: No  Physical Exam: BP (!) 154/101   Pulse (!) 111   Ht 5\' 5"  (1.651 m)   Wt 130 lb (59 kg)   BMI 21.63 kg/m   Constitutional:  Alert and oriented, No acute distress. HEENT: [place] AT, moist mucus membranes.  Trachea midline, no masses. Cardiovascular: No clubbing, cyanosis, or edema. Respiratory: Normal respiratory effort, no increased work of breathing. GI: Abdomen is soft, nontender, nondistended, no abdominal masses GU: 18 French Foley catheter present in urinary meatus, no meatal erosion present, normal circumcised phallus DRE: 60+ gram prostate Skin: No rashes, bruises or suspicious lesions. Lymph: No cervical or inguinal adenopathy. Neurologic: left sided hemiparesis, generalized weakness, decreased mobility, wheel chair bound Psychiatric: Normal mood and affect.   Assessment & Plan:  1. Urinary Retention  - Patient will continue with the indwelling Foley  - Foley changed by Home Health monthly  - RTC for cystoscopy.    - I have explained to the patient that they will  be scheduled for a cystoscopy in our office to evaluate their bladder.  The cystoscopy consists of passing a tube with a lens up through their urethra and into their urinary bladder.   We will inject the urethra with a lidocaine gel prior to introducing the cystoscope to help with any discomfort during the procedure.   After the procedure, they might experience blood in the urine and discomfort with urination.  This will abate after the first few voids.  I have  encouraged the patient to increase water intake  during this time.  Patient denies any allergies to lidocaine.   Return for cystoscopy.  These notes generated with voice recognition software. I apologize for typographical  errors.  Michiel Cowboy, PA-C  [facility] [address] [place], [place] [address] [phone]

## 2016-08-22 ENCOUNTER — Encounter: Payer: Self-pay | Admitting: Urology

## 2016-08-22 ENCOUNTER — Ambulatory Visit: Payer: Medicare HMO

## 2016-08-22 ENCOUNTER — Telehealth: Payer: Self-pay | Admitting: Urology

## 2016-08-22 ENCOUNTER — Ambulatory Visit: Payer: Medicare HMO | Admitting: Urology

## 2016-08-22 VITALS — BP 154/101 | HR 111 | Ht 65.0 in | Wt 130.0 lb

## 2016-08-22 DIAGNOSIS — R339 Retention of urine, unspecified: Secondary | ICD-10-CM

## 2016-08-22 NOTE — Telephone Encounter (Signed)
Upon checkout, pt wife refused to schedule Cystoscopy appt states she will call back to schedule that appt due to the 2 of them having so many appointments that she really didn't know when she could make the appointment.  I tried really hard to get her to let me make an appt and she could change it if needed. Pt. Wife still refused, stating to me that once again that she did not want to make appointment at this time, she would call back later.

## 2016-08-22 NOTE — Patient Instructions (Addendum)
Cystogram °A cystogram, which is also called cystography, is a type of X-ray exam that is used to check for problems with the bladder. You may need this exam if you have blood in your urine (hematuria), urinary tract infections that keep coming back, or possible damage to your bladder from an injury. °During the exam, a type of dye will be injected into your bladder. This dye shows up on an X-ray. The dye makes it easier for your health care provider to see your bladder and check for any possible problems. A cystogram can be used to help diagnose problems such as: °· Cysts. °· Blood clots (hematoma). °· Punctures, tears, or other damage to the bladder. °· Bladder tumors. °· Backward flow of urine from the bladder to the ureter (vesicoureteral reflux). °· An abnormal tunnel (fistula) from the bladder to another organ. °Tell a health care provider about: °· Any allergies you have. °· All medicines you are taking, including vitamins, herbs, eye drops, creams, and over-the-counter medicines. °· Previous problems you or members of your family have had with the use of anesthetics or injectable dyes. °· Any blood disorders you have. °· Any surgeries you have had. °· Any medical conditions you have. °· If you are pregnant or you think that you may be pregnant. °What are the risks? °Generally, this is a safe procedure. However, problems can occur, including: °· Exposure to a small amount of radiation. °· Urinary tract infection. °· Allergic reaction to the dye. This is rare. °What happens before the procedure? °· Ask your health care provider about: °¨ Changing or stopping your regular medicines. This is especially important if you are taking diabetes medicines or blood thinners. °¨ Taking medicines such as aspirin and ibuprofen. These medicines can thin your blood. Do not take these medicines before your procedure if your health care provider instructs you not to. °· Follow your health care provider’s instructions about  eating or drinking restrictions. °What happens during the procedure? °· You will lie on your back on an X-ray table. °· A thin, flexible tube (catheter) will be passed through your urethra and inserted into your bladder. The urethra is the tube through which urine exits your bladder. °· Dye will be injected into your bladder through the catheter. When your bladder is full, the catheter will be clamped. °· X-ray images of your bladder area will be taken. You may be asked to move into different positions for some of the scans. °· The catheter will then be removed. °· In some cases, you may be asked to urinate while more X-rays are taken of your bladder and urethra (voiding cystogram). °The procedure may vary among health care providers and hospitals. °What happens after the procedure? °· Return to your normal activities and your normal diet as directed by your health care provider. °· Drink enough fluid to keep your urine clear or pale yellow. This will help to flush the dye out of your body. °· It is your responsibility to obtain your test results. Ask your health care provider or the department performing the test when and how you will get your results. °This information is not intended to replace advice given to you by your health care provider. Make sure you discuss any questions you have with your health care provider. °Document Released: 04/14/2004 Document Revised: 10/01/2015 Document Reviewed: 12/23/2013 °Elsevier Interactive Patient Education © 2017 [employer] ° °

## 2016-09-30 ENCOUNTER — Other Ambulatory Visit: Payer: Self-pay | Admitting: Urology

## 2016-09-30 DIAGNOSIS — N401 Enlarged prostate with lower urinary tract symptoms: Secondary | ICD-10-CM

## 2017-04-08 ENCOUNTER — Inpatient Hospital Stay
Admission: EM | Admit: 2017-04-08 | Discharge: 2017-04-16 | DRG: 682 | Disposition: A | Discharge status: Skilled Nursing Facility | Payer: Medicare HMO | Attending: Internal Medicine | Admitting: Internal Medicine

## 2017-04-08 ENCOUNTER — Other Ambulatory Visit: Payer: Self-pay

## 2017-04-08 ENCOUNTER — Emergency Department: Payer: Medicare HMO

## 2017-04-08 DIAGNOSIS — Z7189 Other specified counseling: Secondary | ICD-10-CM | POA: Diagnosis not present

## 2017-04-08 DIAGNOSIS — E43 Unspecified severe protein-calorie malnutrition: Secondary | ICD-10-CM | POA: Diagnosis present

## 2017-04-08 DIAGNOSIS — A419 Sepsis, unspecified organism: Secondary | ICD-10-CM | POA: Diagnosis present

## 2017-04-08 DIAGNOSIS — Z87891 Personal history of nicotine dependence: Secondary | ICD-10-CM | POA: Diagnosis not present

## 2017-04-08 DIAGNOSIS — Z87440 Personal history of urinary (tract) infections: Secondary | ICD-10-CM | POA: Diagnosis not present

## 2017-04-08 DIAGNOSIS — R319 Hematuria, unspecified: Secondary | ICD-10-CM | POA: Diagnosis not present

## 2017-04-08 DIAGNOSIS — Z7401 Bed confinement status: Secondary | ICD-10-CM

## 2017-04-08 DIAGNOSIS — I959 Hypotension, unspecified: Secondary | ICD-10-CM | POA: Diagnosis present

## 2017-04-08 DIAGNOSIS — M7031 Other bursitis of elbow, right elbow: Secondary | ICD-10-CM | POA: Diagnosis present

## 2017-04-08 DIAGNOSIS — E860 Dehydration: Secondary | ICD-10-CM | POA: Diagnosis present

## 2017-04-08 DIAGNOSIS — Z515 Encounter for palliative care: Secondary | ICD-10-CM | POA: Diagnosis not present

## 2017-04-08 DIAGNOSIS — Z681 Body mass index (BMI) 19 or less, adult: Secondary | ICD-10-CM

## 2017-04-08 DIAGNOSIS — Z8249 Family history of ischemic heart disease and other diseases of the circulatory system: Secondary | ICD-10-CM | POA: Diagnosis not present

## 2017-04-08 DIAGNOSIS — E7800 Pure hypercholesterolemia, unspecified: Secondary | ICD-10-CM | POA: Diagnosis present

## 2017-04-08 DIAGNOSIS — I509 Heart failure, unspecified: Secondary | ICD-10-CM | POA: Diagnosis present

## 2017-04-08 DIAGNOSIS — D638 Anemia in other chronic diseases classified elsewhere: Secondary | ICD-10-CM | POA: Diagnosis present

## 2017-04-08 DIAGNOSIS — I69354 Hemiplegia and hemiparesis following cerebral infarction affecting left non-dominant side: Secondary | ICD-10-CM | POA: Diagnosis not present

## 2017-04-08 DIAGNOSIS — Z66 Do not resuscitate: Secondary | ICD-10-CM | POA: Diagnosis not present

## 2017-04-08 DIAGNOSIS — D62 Acute posthemorrhagic anemia: Secondary | ICD-10-CM | POA: Diagnosis not present

## 2017-04-08 DIAGNOSIS — I482 Chronic atrial fibrillation: Secondary | ICD-10-CM | POA: Diagnosis present

## 2017-04-08 DIAGNOSIS — Z23 Encounter for immunization: Secondary | ICD-10-CM | POA: Diagnosis present

## 2017-04-08 DIAGNOSIS — N390 Urinary tract infection, site not specified: Secondary | ICD-10-CM

## 2017-04-08 DIAGNOSIS — I130 Hypertensive heart and chronic kidney disease with heart failure and stage 1 through stage 4 chronic kidney disease, or unspecified chronic kidney disease: Secondary | ICD-10-CM | POA: Diagnosis present

## 2017-04-08 DIAGNOSIS — R4182 Altered mental status, unspecified: Secondary | ICD-10-CM

## 2017-04-08 DIAGNOSIS — E870 Hyperosmolality and hypernatremia: Secondary | ICD-10-CM | POA: Diagnosis present

## 2017-04-08 DIAGNOSIS — N179 Acute kidney failure, unspecified: Principal | ICD-10-CM | POA: Diagnosis present

## 2017-04-08 DIAGNOSIS — D509 Iron deficiency anemia, unspecified: Secondary | ICD-10-CM | POA: Diagnosis present

## 2017-04-08 DIAGNOSIS — E875 Hyperkalemia: Secondary | ICD-10-CM | POA: Diagnosis present

## 2017-04-08 DIAGNOSIS — N183 Chronic kidney disease, stage 3 (moderate): Secondary | ICD-10-CM | POA: Diagnosis present

## 2017-04-08 LAB — URINALYSIS, COMPLETE (UACMP) WITH MICROSCOPIC
BILIRUBIN URINE: NEGATIVE
GLUCOSE, UA: NEGATIVE mg/dL
Ketones, ur: NEGATIVE mg/dL
NITRITE: NEGATIVE
Protein, ur: 100 mg/dL — AB
SPECIFIC GRAVITY, URINE: 1.015 (ref 1.005–1.030)
pH: 5 (ref 5.0–8.0)

## 2017-04-08 LAB — COMPREHENSIVE METABOLIC PANEL
ALT: 40 U/L (ref 17–63)
AST: 38 U/L (ref 15–41)
Albumin: 2.2 g/dL — ABNORMAL LOW (ref 3.5–5.0)
Alkaline Phosphatase: 70 U/L (ref 38–126)
Anion gap: 7 (ref 5–15)
BUN: 97 mg/dL — ABNORMAL HIGH (ref 6–20)
CHLORIDE: 122 mmol/L — AB (ref 101–111)
CO2: 27 mmol/L (ref 22–32)
Calcium: 8.3 mg/dL — ABNORMAL LOW (ref 8.9–10.3)
Creatinine, Ser: 2.41 mg/dL — ABNORMAL HIGH (ref 0.61–1.24)
GFR, EST AFRICAN AMERICAN: 26 mL/min — AB (ref >60)
GFR, EST NON AFRICAN AMERICAN: 23 mL/min — AB (ref >60)
Glucose, Bld: 111 mg/dL — ABNORMAL HIGH (ref 65–99)
POTASSIUM: 5.3 mmol/L — AB (ref 3.5–5.1)
Sodium: 156 mmol/L — ABNORMAL HIGH (ref 135–145)
TOTAL PROTEIN: 6.3 g/dL — AB (ref 6.5–8.1)
Total Bilirubin: 0.7 mg/dL (ref 0.3–1.2)

## 2017-04-08 LAB — CBC
HCT: 24.3 % — ABNORMAL LOW (ref 40.0–52.0)
Hemoglobin: 7.6 g/dL — ABNORMAL LOW (ref 13.0–18.0)
MCH: 28.8 pg (ref 26.0–34.0)
MCHC: 31.1 g/dL — AB (ref 32.0–36.0)
MCV: 92.8 fL (ref 80.0–100.0)
PLATELETS: 413 10*3/uL (ref 150–440)
RBC: 2.62 MIL/uL — ABNORMAL LOW (ref 4.40–5.90)
RDW: 20.3 % — AB (ref 11.5–14.5)
WBC: 20.1 10*3/uL — AB (ref 3.8–10.6)

## 2017-04-08 LAB — LACTIC ACID, PLASMA: Lactic Acid, Venous: 0.9 mmol/L (ref 0.5–1.9)

## 2017-04-08 LAB — TSH: TSH: 3.458 u[IU]/mL (ref 0.350–4.500)

## 2017-04-08 MED ORDER — ACETAMINOPHEN 325 MG PO TABS
650.0000 mg | ORAL_TABLET | Freq: Four times a day (QID) | ORAL | Status: DC | PRN
  Administered 2017-04-09 – 2017-04-14 (×4): 650 mg via ORAL
  Filled 2017-04-08 (×4): qty 2

## 2017-04-08 MED ORDER — LUBIPROSTONE 24 MCG PO CAPS
24.0000 ug | ORAL_CAPSULE | Freq: Two times a day (BID) | ORAL | Status: DC
  Administered 2017-04-09 – 2017-04-13 (×9): 24 ug via ORAL
  Filled 2017-04-08 (×10): qty 1

## 2017-04-08 MED ORDER — RIVAROXABAN 15 MG PO TABS
15.0000 mg | ORAL_TABLET | Freq: Every day | ORAL | Status: DC
  Administered 2017-04-09 – 2017-04-10 (×2): 15 mg via ORAL
  Filled 2017-04-08 (×2): qty 1

## 2017-04-08 MED ORDER — ACETAMINOPHEN 650 MG RE SUPP: 650.0000 mg | Freq: Four times a day (QID) | RECTAL | Status: DC | PRN

## 2017-04-08 MED ORDER — DOCUSATE SODIUM 100 MG PO CAPS
100.0000 mg | ORAL_CAPSULE | Freq: Two times a day (BID) | ORAL | Status: DC
  Administered 2017-04-08 – 2017-04-16 (×15): 100 mg via ORAL
  Filled 2017-04-08 (×15): qty 1

## 2017-04-08 MED ORDER — DEXTROSE-NACL 5-0.45 % IV SOLN
INTRAVENOUS | Status: DC
  Administered 2017-04-08 – 2017-04-09 (×2): via INTRAVENOUS

## 2017-04-08 MED ORDER — ONDANSETRON HCL 4 MG PO TABS: 4.0000 mg | ORAL_TABLET | Freq: Four times a day (QID) | ORAL | Status: DC | PRN

## 2017-04-08 MED ORDER — PNEUMOCOCCAL VAC POLYVALENT 25 MCG/0.5ML IJ INJ
0.5000 mL | INJECTION | INTRAMUSCULAR | Status: AC
  Administered 2017-04-11: 12:00:00 0.5 mL via INTRAMUSCULAR
  Filled 2017-04-08: qty 0.5

## 2017-04-08 MED ORDER — PIPERACILLIN-TAZOBACTAM 3.375 G IVPB 30 MIN
3.3750 g | Freq: Once | INTRAVENOUS | Status: AC
  Administered 2017-04-08: 3.375 g via INTRAVENOUS
  Filled 2017-04-08: qty 50

## 2017-04-08 MED ORDER — CARVEDILOL 25 MG PO TABS
25.0000 mg | ORAL_TABLET | Freq: Two times a day (BID) | ORAL | Status: DC
  Administered 2017-04-08 – 2017-04-14 (×11): 25 mg via ORAL
  Filled 2017-04-08 (×11): qty 1

## 2017-04-08 MED ORDER — ONDANSETRON HCL 4 MG/2ML IJ SOLN: 4.0000 mg | Freq: Four times a day (QID) | INTRAMUSCULAR | Status: DC | PRN

## 2017-04-08 MED ORDER — ATORVASTATIN CALCIUM 20 MG PO TABS
80.0000 mg | ORAL_TABLET | Freq: Every evening | ORAL | Status: DC
  Administered 2017-04-09 – 2017-04-16 (×8): 80 mg via ORAL
  Filled 2017-04-08 (×8): qty 4

## 2017-04-08 MED ORDER — FERROUS SULFATE 325 (65 FE) MG PO TABS
325.0000 mg | ORAL_TABLET | Freq: Two times a day (BID) | ORAL | Status: DC
  Administered 2017-04-09 – 2017-04-16 (×15): 325 mg via ORAL
  Filled 2017-04-08 (×15): qty 1

## 2017-04-08 MED ORDER — DEXTROSE 5 % IV SOLN
1.0000 g | INTRAVENOUS | Status: DC
  Administered 2017-04-08 – 2017-04-09 (×2): 1 g via INTRAVENOUS
  Filled 2017-04-08 (×3): qty 10

## 2017-04-08 MED ORDER — DILTIAZEM HCL ER COATED BEADS 120 MG PO CP24
120.0000 mg | ORAL_CAPSULE | Freq: Every day | ORAL | Status: DC
  Administered 2017-04-10 – 2017-04-12 (×3): 120 mg via ORAL
  Filled 2017-04-08 (×5): qty 1

## 2017-04-08 MED ORDER — VANCOMYCIN HCL IN DEXTROSE 1-5 GM/200ML-% IV SOLN
1000.0000 mg | Freq: Once | INTRAVENOUS | Status: AC
  Administered 2017-04-08: 1000 mg via INTRAVENOUS
  Filled 2017-04-08: qty 200

## 2017-04-08 MED ORDER — SODIUM CHLORIDE 0.9 % IV BOLUS (SEPSIS)
1000.0000 mL | Freq: Once | INTRAVENOUS | Status: AC
  Administered 2017-04-08: 1000 mL via INTRAVENOUS

## 2017-04-08 MED ORDER — ADULT MULTIVITAMIN W/MINERALS CH
1.0000 | ORAL_TABLET | Freq: Every day | ORAL | Status: DC
  Administered 2017-04-09 – 2017-04-16 (×8): 1 via ORAL
  Filled 2017-04-08 (×8): qty 1

## 2017-04-08 NOTE — Progress Notes (Signed)
CODE SEPSIS - PHARMACY COMMUNICATION  **Broad Spectrum Antibiotics should be administered within 1 hour of Sepsis diagnosis**  Time Code Sepsis Called/Page Received: 1/13 @1751   Antibiotics Ordered: Zosyn 3.375g                                      Vancomycin 1g  Time of 1st antibiotic administration: 1/13 @ 1822  Additional action taken by pharmacy: N/A  If necessary, Name of Provider/Nurse Contacted: N/A  Gardner CandleSheema M Geraline Halberstadt, PharmD, BCPS Clinical Pharmacist 04/08/2017 6:31 PM

## 2017-04-08 NOTE — H&P (Signed)
[facility] [place] [facility]   PATIENT NAME: Jordan Kline    MR#:  [id number]  DATE OF BIRTH:  [birth year]  DATE OF ADMISSION:  04/08/2017  PRIMARY CARE PHYSICIAN: Sherron Monday, MD   REQUESTING/REFERRING PHYSICIAN: Dr. Minna Antis  CHIEF COMPLAINT:   Chief Complaint  Patient presents with  . Recurrent UTI    Suspected UTI    HISTORY OF PRESENT ILLNESS:  Jordan Kline  is a 84 y.o. male with a known history of atrial fibrillation on Xarelto, history of stroke with left hemiparesis was bed bound at baseline, hypertension, chronic urinary retention with Foley catheter chronically for 2 years presents to hospital secondary to altered mental status. Patient is bedbound at baseline, taken care by wife at home. Wife provides most of the history. According to her, patient had been declining in the last 3-4 days. His appetite has decreased and his oral intake has significantly decreased. Yesterday he was more sleepy and today was noted to be lethargic when she was trying to feed him. He has had urinary tract infections prior and so brought him to the emergency room. He is noted to be hypernatremic with hyperkalemia, urine analysis with significant amount of blood and bacteria and WBCs noted. He is being admitted for sepsis, likely secondary to UTI. No fevers or chills at home. Patient denies any pain, nausea or vomiting.  PAST MEDICAL HISTORY:   Past Medical History:  Diagnosis Date  . A-fib (HCC)    On Xarelto  . Chronic kidney disease (CKD)   . High cholesterol   . Hypertension   . Inguinal hernia   . Stroke [facility])    With left-sided hemiparesis  . Urinary retention    Chronic Foley catheter since December 2016    PAST SURGICAL HISTORY:   Past Surgical History:  Procedure Laterality Date  . APPENDECTOMY    . PERIPHERAL VASCULAR CATHETERIZATION N/A 11/10/2015   Procedure: Visceral Angiography, mesenteric angio;  Surgeon: Annice Needy,  MD;  Location: [facility] INVASIVE CV LAB;  Service: Cardiovascular;  Laterality: N/A;  . PERIPHERAL VASCULAR CATHETERIZATION N/A 11/10/2015   Procedure: Visceral Artery Intervention;  Surgeon: Annice Needy, MD;  Location: [facility] INVASIVE CV LAB;  Service: Cardiovascular;  Laterality: N/A;    SOCIAL HISTORY:   Social History   Tobacco Use  . Smoking status: Former Games developer  . Smokeless tobacco: Never Used  . Tobacco comment: quit 50 years  Substance Use Topics  . Alcohol use: No    Alcohol/week: 0.0 oz    FAMILY HISTORY:   Family History  Problem Relation Age of Onset  . Hypertension Father   . Kidney cancer Neg Hx   . Kidney disease Neg Hx   . Prostate cancer Neg Hx     DRUG ALLERGIES:  No Known Allergies  REVIEW OF SYSTEMS:   Review of Systems  Constitutional: Positive for malaise/fatigue. Negative for chills, fever and weight loss.  HENT: Negative for ear discharge, ear pain, hearing loss and nosebleeds.   Eyes: Negative for blurred vision, double vision and photophobia.  Respiratory: Negative for cough, hemoptysis, shortness of breath and wheezing.   Cardiovascular: Negative for chest pain, palpitations, orthopnea and leg swelling.  Gastrointestinal: Negative for abdominal pain, constipation, diarrhea, melena, nausea and vomiting.  Genitourinary: Negative for dysuria, frequency and urgency.  Musculoskeletal: Negative for back pain, myalgias and neck pain.  Skin: Negative for rash.  Neurological: Negative for dizziness, tingling, tremors, sensory change, speech change, focal  weakness and headaches.  Endo/Heme/Allergies: Does not bruise/bleed easily.  Psychiatric/Behavioral: Negative for depression.       Confusion    MEDICATIONS AT HOME:   Prior to Admission medications   Medication Sig Start Date End Date Taking? Authorizing Provider  atorvastatin (LIPITOR) 80 MG tablet Take 80 mg by mouth every evening.    Yes [provider]  carvedilol (COREG) 25 MG tablet  Take 1 tablet by mouth 2 (two) times daily. 04/07/17  Yes [provider]  ciprofloxacin (CIPRO) 500 MG tablet Take 1 tablet by mouth 2 (two) times daily. 04/06/17  Yes [provider]  diltiazem (CARDIZEM CD) 120 MG 24 hr capsule Take 120 mg by mouth every evening.    Yes [provider]  docusate sodium (COLACE) 100 MG capsule Take 100 mg by mouth 2 (two) times daily.    Yes [provider]  ENTRESTO 97-103 MG Take 1 tablet by mouth 2 (two) times daily. 03/11/17  Yes [provider]  lubiprostone (AMITIZA) 24 MCG capsule Take 24 mcg by mouth 2 (two) times daily with a meal.    Yes [provider]  Multiple Vitamin (MULTIVITAMIN WITH MINERALS) TABS tablet Take 1 tablet by mouth daily.   Yes [provider]  Rivaroxaban (XARELTO) 15 MG TABS tablet Take 15 mg by mouth 2 (two) times daily with a meal.   Yes [provider]  ferrous sulfate 325 (65 FE) MG tablet Take 1 tablet (325 mg total) by mouth 2 (two) times daily with a meal. Patient not taking: Reported on 04/08/2017 03/04/15   Auburn BilberryPatel, Shreyang, MD  finasteride (PROSCAR) 5 MG tablet TAKE 1 TABLET BY MOUTH DAILY Patient not taking: Reported on 04/08/2017 10/02/16   Michiel CowboyMcGowan, Shannon A, PA-C  oxybutynin (DITROPAN) 5 MG tablet Take 1 tablet (5 mg total) by mouth every 8 (eight) hours as needed for bladder spasms. Patient not taking: Reported on 08/22/2016 07/21/15   Michiel CowboyMcGowan, Shannon A, PA-C  oxyCODONE-acetaminophen (ROXICET) 5-325 MG tablet Take 1 tablet by mouth every 4 (four) hours as needed for severe pain. Patient not taking: Reported on 04/08/2017 02/12/16   Darci CurrentBrown, [place] N, MD  tamsulosin (FLOMAX) 0.4 MG CAPS capsule Take 1 capsule (0.4 mg total) by mouth daily. Patient not taking: Reported on 04/08/2017 04/03/16   Michiel CowboyMcGowan, Shannon A, PA-C  terazosin (HYTRIN) 2 MG capsule Take 1 capsule (2 mg total) by mouth at bedtime. Patient not taking: Reported on 08/22/2016 11/11/15   Adrian SaranMody,  Sital, MD      VITAL SIGNS:  Blood pressure (!) 161/133, pulse (!) 38, temperature 97.9 F (36.6 C), temperature source Oral, resp. rate (!) 21, height 5\' 9"  (1.753 m), weight 61.2 kg (135 lb), SpO2 100 %.  PHYSICAL EXAMINATION:   Physical Exam  GENERAL:  84 y.o.-year-old patient lying in the bed with no acute distress.  EYES: Pupils equal, round, reactive to light and accommodation. No scleral icterus. Extraocular muscles intact.  HEENT: Head atraumatic, normocephalic. Left facial droop noted. Oropharynx and nasopharynx clear.  NECK:  Supple, no jugular venous distention. No thyroid enlargement, no tenderness.  LUNGS: Normal breath sounds bilaterally, no wheezing, rales,rhonchi or crepitation. No use of accessory muscles of respiration. Decreased bibasilar breath sounds CARDIOVASCULAR: S1, S2 normal. No rubs, or gallops. 2/6 systolic murmur present ABDOMEN: Soft, nontender, nondistended. Bowel sounds present. No organomegaly or mass.  EXTREMITIES: No pedal edema, cyanosis, or clubbing.  NEUROLOGIC: Cranial nerves II through XII are intact. Muscle strength 4/5 in right upper  and lower extremities, LUE is flexed and contracted, LLE has 2/5 strength. Sensation intact. Gait not checked.  PSYCHIATRIC: The patient is alert and oriented.  SKIN: No obvious rash, lesion, or ulcer.   LABORATORY PANEL:   CBC Recent Labs  Lab 04/08/17 1652  WBC 20.1*  HGB 7.6*  HCT 24.3*  PLT 413   ------------------------------------------------------------------------------------------------------------------  Chemistries  Recent Labs  Lab 04/08/17 1652  NA 156*  K 5.3*  CL 122*  CO2 27  GLUCOSE 111*  BUN 97*  CREATININE 2.41*  CALCIUM 8.3*  AST 38  ALT 40  ALKPHOS 70  BILITOT 0.7   ------------------------------------------------------------------------------------------------------------------  Cardiac Enzymes No results for input(s): TROPONINI in the last 168  hours. ------------------------------------------------------------------------------------------------------------------  RADIOLOGY:  Dg Chest Port 1 View  Result Date: 04/08/2017 CLINICAL DATA:  Altered mental status. EXAM: PORTABLE CHEST 1 VIEW COMPARISON:  09/25/2004 FINDINGS: The cardiac silhouette, mediastinal and hilar contours are within normal limits and stable. Mild tortuosity of the thoracic aorta. The lungs are clear. No pleural effusion. The bony thorax is intact. Prominent skin fold noted over the left chest but no pneumothorax. IMPRESSION: No acute cardiopulmonary findings. Electronically Signed   By: Rudie Meyer M.D.   On: 04/08/2017 18:04    EKG:   Orders placed or performed during the hospital encounter of 04/08/17  . ED EKG 12-Lead  . ED EKG 12-Lead    IMPRESSION AND PLAN:   Tayo Maute  is a 84 y.o. male with a known history of atrial fibrillation on Xarelto, history of stroke with left hemiparesis was bed bound at baseline, hypertension, chronic urinary retention with Foley catheter chronically for 2 years presents to hospital secondary to altered mental status.  1.  Sepsis- with altered mental status, leukocytosis and hypotension on presentation -Secondary to urinary tract infection. Sent for blood cultures and urine cultures. -Received IV fluids. Started on Rocephin until culture results are available. -Blood pressure has improved at this time.  2. Hypernatremia-likely secondary to dehydration and free water deficit. -Started on D5 half normal saline and continue to monitor.  3. Acute renal failure on CK D stage III-baseline creatinine around 1.7. -IV fluids and monitor. Hold nephrotoxins. Also with hyperkalemia.  - hold entresto  4. Chronic anemia-anemia of chronic disease, iron deficiency. Was taking iron supplements as outpatient in the past. We will restart them at this time.  5. Chronic atrial fibrillation-continue Coreg and Cardizem if blood  pressure permits. -His Xarelto is being given as 15 mg twice a day-not sure why he is on such a high-dose. -With his creatinine clearance and reason for anticoagulation being A. fib-change the Xarelto to 15 mg daily.  6. H/o CVA-seems to be at baseline. On xarelto and statin  7. DVT Prophylaxis- already on xarelto   All the records are reviewed and case discussed with ED provider. Management plans discussed with the patient, family and they are in agreement.  CODE STATUS: Full code  TOTAL TIME TAKING CARE OF THIS PATIENT: 50 minutes.    Enid Baas M.D on 04/08/2017 at 7:35 PM  Between 7am to 6pm - Pager - [phone]  After 6pm go to [email or web address] - password [email or web address]  [facility]  Office  [phone]  CC: Primary care physician; Sherron Monday, MD

## 2017-04-08 NOTE — ED Provider Notes (Signed)
[facility] Emergency Department Provider Note  Time seen: 4:34 PM  I have reviewed the triage vital signs and the nursing notes.   HISTORY  Chief Complaint Recurrent UTI (Suspected UTI)    HPI Jordan Kline is a 84 y.o. male with a past medical history of atrial fibrillation, CKD, hyperlipidemia, hypertension, CVA, urinary retention with an indwelling Foley catheter presents to the emergency department for possible urinary tract infection.  According to EMS patient lives at home with his wife.  States they were called today for increased sleepiness and a history of urinary tract infections with similar symptoms in the past.  Here the patient is awake alert.  He is disoriented to time and situation, it is not clear what his baseline is.  Attempted to reach wife for further history but there is no answer on the provided phone number.  EMS believes the wife was going to come to the ER.  We will await her arrival for further history.   Past Medical History:  Diagnosis Date  . A-fib (HCC)   . Chronic kidney disease (CKD)   . High cholesterol   . Hypertension   . Inguinal hernia   . Stroke (HCC)   . Urinary retention     Patient Active Problem List   Diagnosis Date Noted  . Protein-calorie malnutrition, severe 11/09/2015  . GIB (gastrointestinal bleeding) 11/08/2015  . Recurrent UTI 07/12/2015  . UTI (lower urinary tract infection) 03/01/2015  . Urinary retention 03/01/2015    Past Surgical History:  Procedure Laterality Date  . APPENDECTOMY    . PERIPHERAL VASCULAR CATHETERIZATION N/A 11/10/2015   Procedure: Visceral Angiography, mesenteric angio;  Surgeon: Annice Needy, MD;  Location: [facility] INVASIVE CV LAB;  Service: Cardiovascular;  Laterality: N/A;  . PERIPHERAL VASCULAR CATHETERIZATION N/A 11/10/2015   Procedure: Visceral Artery Intervention;  Surgeon: Annice Needy, MD;  Location: [facility] INVASIVE CV LAB;  Service: Cardiovascular;  Laterality: N/A;     Prior to Admission medications   Medication Sig Start Date End Date Taking? Authorizing Provider  atorvastatin (LIPITOR) 80 MG tablet Take 80 mg by mouth every evening.     [provider]  diltiazem (CARDIZEM CD) 120 MG 24 hr capsule Take 120 mg by mouth every evening.     [provider]  docusate sodium (COLACE) 100 MG capsule Take 100 mg by mouth 2 (two) times daily.     [provider]  doxycycline (VIBRAMYCIN) 100 MG capsule Take 100 mg by mouth 2 (two) times daily.    [provider]  ferrous sulfate 325 (65 FE) MG tablet Take 1 tablet (325 mg total) by mouth 2 (two) times daily with a meal. Patient taking differently: Take 325 mg by mouth daily.  03/04/15   Auburn Bilberry, MD  finasteride (PROSCAR) 5 MG tablet TAKE 1 TABLET BY MOUTH DAILY 10/02/16   Michiel Cowboy A, PA-C  lubiprostone (AMITIZA) 24 MCG capsule Take 24 mcg by mouth daily.     [provider]  metoprolol (LOPRESSOR) 100 MG tablet Take 100 mg by mouth 2 (two) times daily.    [provider]  Multiple Vitamin (MULTIVITAMIN WITH MINERALS) TABS tablet Take 1 tablet by mouth daily.    [provider]  oxybutynin (DITROPAN) 5 MG tablet Take 1 tablet (5 mg total) by mouth every 8 (eight) hours as needed for bladder spasms. Patient not taking: Reported on 08/22/2016 07/21/15   Michiel Cowboy A, PA-C  oxyCODONE-acetaminophen (ROXICET) 5-325 MG tablet  Take 1 tablet by mouth every 4 (four) hours as needed for severe pain. 02/12/16   Darci Current, MD  ramipril (ALTACE) 5 MG capsule Take 5 mg by mouth daily.    [provider]  Rivaroxaban (XARELTO) 15 MG TABS tablet Take 15 mg by mouth 2 (two) times daily with a meal.    [provider]  tamsulosin (FLOMAX) 0.4 MG CAPS capsule Take 1 capsule (0.4 mg total) by mouth daily. 04/03/16   Michiel Cowboy A, PA-C  terazosin (HYTRIN) 2 MG capsule Take 1 capsule (2 mg total) by mouth at bedtime. Patient  not taking: Reported on 08/22/2016 11/11/15   Adrian Saran, MD  triamterene-hydrochlorothiazide (DYAZIDE) 37.5-25 MG capsule Take 1 capsule by mouth daily. Only on Mondays, Wednesdays and Fridays. 01/22/15   [provider]    No Known Allergies  Family History  Problem Relation Age of Onset  . Hypertension Father   . Kidney cancer Neg Hx   . Kidney disease Neg Hx   . Prostate cancer Neg Hx     Social History Social History   Tobacco Use  . Smoking status: Former Games developer  . Smokeless tobacco: Never Used  . Tobacco comment: quit 50 years  Substance Use Topics  . Alcohol use: No    Alcohol/week: 0.0 oz  . Drug use: No    Review of Systems Unable to obtain an adequate/accurate review of systems due to confusion, unclear if this is baseline or not.  ____________________________________________   PHYSICAL EXAM:  VITAL SIGNS: ED Triage Vitals [04/08/17 1627]  Enc Vitals Group     BP      Pulse      Resp      Temp      Temp src      SpO2      Weight 135 lb (61.2 kg)     Height 5\' 9"  (1.753 m)     Head Circumference      Peak Flow      Pain Score      Pain Loc      Pain Edu?      Excl. in GC?    Constitutional: Alert, oriented to person and place only.  No distress, calm, cooperative lying in bed.  Denies any complaints. Eyes: Normal exam ENT   Head: Normocephalic and atraumatic   Mouth/Throat: Mucous membranes are moist. Cardiovascular: Normal rate, regular rhythm.  Respiratory: Normal respiratory effort without tachypnea nor retractions. Breath sounds are clear  Gastrointestinal: Soft and nontender. No distention.   Musculoskeletal: Nontender with normal range of motion in all extremities.  Neurologic:  Normal speech and language.  Moves all extremities. Skin:  Skin is warm, dry and intact.  Psychiatric: Mood and affect are normal.  ____________________________________________   RADIOLOGY  Chest x-ray  normal  ____________________________________________   INITIAL IMPRESSION / ASSESSMENT AND PLAN / ED COURSE  Pertinent labs & imaging results that were available during my care of the patient were reviewed by me and considered in my medical decision making (see chart for details).  Patient presents to the emergency department for possible urinary tract infection/increased sleepiness.  Differential would include urinary tract infection, metabolic abnormality, dehydration.  We will check labs, urinalysis, urine culture.  We will continue to closely monitor in the emergency department awaiting further history.  Overall the patient appears well at this time, alert, responsive, answers questions follows commands but is confused, does not clear what his baseline mental status is.  Wife  is now here with the patient.  States over the past 2-3 days he has been feeling more weak than normal.  Today he was confused.  Currently does not know where he is or what year it is.  Wife states this is a major change from baseline normally he is alert and oriented x4, and converses normally.  We will check labs, urinalysis, IV hydrate and continue to closely monitor in the emergency department.   Patient's labs have begun to resolve with a white blood cell count of 20,000 currently.  Patient is hypernatremic at 156.  The wife does state decreased oral intake over the past several days due to generalized fatigue/weakness.  Creatinine of 2.4 which is above baseline.  Hemoglobin is largely stable compared to baseline.  We will begin with IV hydration.  Given the elevated white blood cell count confusion/altered mental status I have initiated sepsis protocols for likely infection possible urinary tract infection.  The rest of the labs are pending at this time we will start broad-spectrum antibiotics.  Patient will be admitted to the hospital once his ED workup is completed.   CRITICAL CARE Performed by: Minna AntisKevin  Mykela Mewborn   Total critical care time: 30 minutes  Critical care time was exclusive of separately billable procedures and treating other patients.  Critical care was necessary to treat or prevent imminent or life-threatening deterioration.  Critical care was time spent personally by me on the following activities: development of treatment plan with patient and/or surrogate as well as nursing, discussions with consultants, evaluation of patient's response to treatment, examination of patient, obtaining history from patient or surrogate, ordering and performing treatments and interventions, ordering and review of laboratory studies, ordering and review of radiographic studies, pulse oximetry and re-evaluation of patient's condition.   ____________________________________________   FINAL CLINICAL IMPRESSION(S) / ED DIAGNOSES  Altered mental status Sepsis Urinary tract infection   Minna AntisPaduchowski, Viraat Vanpatten, MD 04/08/17 [phone]

## 2017-04-08 NOTE — Progress Notes (Signed)
Called pt's RN Sonja in ED to have her address pt's BP before accepting pt to floor.

## 2017-04-08 NOTE — Consult Note (Signed)
Pharmacy Antibiotic Note  Jordan PennaDouglas Kline is a 84 y.o. male admitted on 04/08/2017 with  UTI.  Pharmacy has been consulted for Ceftraixone dosing.  Plan: Start ceftriaxone 1g IV every 24 hours  Height: 5\' 9"  (175.3 cm) Weight: 135 lb (61.2 kg) IBW/kg (Calculated) : 70.7  Temp (24hrs), Avg:98 F (36.7 C), Min:97.9 F (36.6 C), Max:98 F (36.7 C)  Recent Labs  Lab 04/08/17 1652 04/08/17 1811  WBC 20.1*  --   CREATININE 2.41*  --   LATICACIDVEN  --  0.9    Estimated Creatinine Clearance: 18.7 mL/min (A) (by C-G formula based on SCr of 2.41 mg/dL (H)).    No Known Allergies  Antimicrobials this admission: 1/13 ceftriaxone >>  Dose adjustments this admission:  Microbiology results: 1/13 BCx: pending 1/13 UCx: pending  Thank you for allowing pharmacy to be a part of this patient's care.  Gardner CandleSheema M Peniel Biel, PharmD, BCPS Clinical Pharmacist 04/08/2017 7:48 PM

## 2017-04-08 NOTE — ED Notes (Signed)
Code Sepsis called to [facility] 

## 2017-04-08 NOTE — ED Notes (Signed)
Emptied catheter .AS

## 2017-04-08 NOTE — ED Notes (Signed)
Patient has been repositioned and b/p cuff position changed.

## 2017-04-08 NOTE — ED Notes (Signed)
Transport patient to 1C- 123.AS

## 2017-04-09 LAB — BASIC METABOLIC PANEL
Anion gap: 6 (ref 5–15)
BUN: 88 mg/dL — ABNORMAL HIGH (ref 6–20)
CO2: 25 mmol/L (ref 22–32)
CREATININE: 2.27 mg/dL — AB (ref 0.61–1.24)
Calcium: 7.5 mg/dL — ABNORMAL LOW (ref 8.9–10.3)
Chloride: 126 mmol/L — ABNORMAL HIGH (ref 101–111)
GFR calc non Af Amer: 24 mL/min — ABNORMAL LOW (ref >60)
GFR, EST AFRICAN AMERICAN: 28 mL/min — AB (ref >60)
Glucose, Bld: 181 mg/dL — ABNORMAL HIGH (ref 65–99)
POTASSIUM: 4.9 mmol/L (ref 3.5–5.1)
SODIUM: 157 mmol/L — AB (ref 135–145)

## 2017-04-09 LAB — CBC
HEMATOCRIT: 20.4 % — AB (ref 40.0–52.0)
Hemoglobin: 6.4 g/dL — ABNORMAL LOW (ref 13.0–18.0)
MCH: 28.8 pg (ref 26.0–34.0)
MCHC: 31.1 g/dL — ABNORMAL LOW (ref 32.0–36.0)
MCV: 92.4 fL (ref 80.0–100.0)
PLATELETS: 308 10*3/uL (ref 150–440)
RBC: 2.21 MIL/uL — AB (ref 4.40–5.90)
RDW: 19.5 % — ABNORMAL HIGH (ref 11.5–14.5)
WBC: 16.6 10*3/uL — AB (ref 3.8–10.6)

## 2017-04-09 LAB — SODIUM
Sodium: 155 mmol/L — ABNORMAL HIGH (ref 135–145)
Sodium: 153 mmol/L — ABNORMAL HIGH (ref 135–145)

## 2017-04-09 MED ORDER — DEXTROSE 5 % IV SOLN
INTRAVENOUS | Status: DC
  Administered 2017-04-09 – 2017-04-10 (×2): via INTRAVENOUS
  Administered 2017-04-11: 500 mL via INTRAVENOUS
  Administered 2017-04-11 – 2017-04-12 (×2): via INTRAVENOUS
  Filled 2017-04-09: qty 1000

## 2017-04-09 NOTE — Progress Notes (Signed)
[facility] - [place] at [facility]   PATIENT NAME: Jordan PennaDouglas Urbach    MR#:  [id number]  DATE OF BIRTH:  [birth year]  SUBJECTIVE:  CHIEF COMPLAINT: There is any abdominal pain nausea or vomiting.  Resting comfortably.  No back pain.  Wife and son at bedside  REVIEW OF SYSTEMS:  CONSTITUTIONAL: No fever, fatigue or weakness.  EYES: No blurred or double vision.  EARS, NOSE, AND THROAT: No tinnitus or ear pain.  RESPIRATORY: No cough, shortness of breath, wheezing or hemoptysis.  CARDIOVASCULAR: No chest pain, orthopnea, edema.  GASTROINTESTINAL: No nausea, vomiting, diarrhea or abdominal pain.  GENITOURINARY: No dysuria, hematuria.  ENDOCRINE: No polyuria, nocturia,  HEMATOLOGY: No anemia, easy bruising or bleeding SKIN: No rash or lesion. MUSCULOSKELETAL: No joint pain or arthritis.   NEUROLOGIC: No tingling, numbness, weakness.  PSYCHIATRY: No anxiety or depression.   DRUG ALLERGIES:  No Known Allergies  VITALS:  Blood pressure (!) 87/55, pulse 90, temperature 97.8 F (36.6 C), temperature source Oral, resp. rate 18, height 5\' 9"  (1.753 m), weight 61.2 kg (135 lb), SpO2 100 %.  PHYSICAL EXAMINATION:  GENERAL:  84 y.o.-year-old patient lying in the bed with no acute distress.  EYES: Pupils equal, round, reactive to light and accommodation. No scleral icterus. Extraocular muscles intact.  HEENT: Head atraumatic, normocephalic. Oropharynx and nasopharynx clear.  NECK:  Supple, no jugular venous distention. No thyroid enlargement, no tenderness.  LUNGS: Normal breath sounds bilaterally, no wheezing, rales,rhonchi or crepitation. No use of accessory muscles of respiration.  CARDIOVASCULAR: S1, S2 normal. No murmurs, rubs, or gallops.  ABDOMEN: Soft, nontender, nondistended. Bowel sounds present. No organomegaly or mass.  EXTREMITIES: No pedal edema, cyanosis, or clubbing.  NEUROLOGIC: Cranial nerves II through XII are intact. Muscle strength 5/5 in all  extremities. Sensation intact. Gait not checked.  PSYCHIATRIC: The patient is alert and oriented x 3.  SKIN: No obvious rash, lesion, or ulcer.    LABORATORY PANEL:   CBC Recent Labs  Lab 04/09/17 0317  WBC 16.6*  HGB 6.4*  HCT 20.4*  PLT 308   ------------------------------------------------------------------------------------------------------------------  Chemistries  Recent Labs  Lab 04/08/17 1652 04/09/17 0317 04/09/17 1234  NA 156* 157* 155*  K 5.3* 4.9  --   CL 122* 126*  --   CO2 27 25  --   GLUCOSE 111* 181*  --   BUN 97* 88*  --   CREATININE 2.41* 2.27*  --   CALCIUM 8.3* 7.5*  --   AST 38  --   --   ALT 40  --   --   ALKPHOS 70  --   --   BILITOT 0.7  --   --    ------------------------------------------------------------------------------------------------------------------  Cardiac Enzymes No results for input(s): TROPONINI in the last 168 hours. ------------------------------------------------------------------------------------------------------------------  RADIOLOGY:  Dg Chest Port 1 View  Result Date: 04/08/2017 CLINICAL DATA:  Altered mental status. EXAM: PORTABLE CHEST 1 VIEW COMPARISON:  09/25/2004 FINDINGS: The cardiac silhouette, mediastinal and hilar contours are within normal limits and stable. Mild tortuosity of the thoracic aorta. The lungs are clear. No pleural effusion. The bony thorax is intact. Prominent skin fold noted over the left chest but no pneumothorax. IMPRESSION: No acute cardiopulmonary findings. Electronically Signed   By: Rudie MeyerP.  Gallerani M.D.   On: 04/08/2017 18:04    EKG:   Orders placed or performed during the hospital encounter of 04/08/17  . ED EKG 12-Lead  . ED EKG 12-Lead    ASSESSMENT AND  PLAN:   Tiandre Teall  is a 84 y.o. male with a known history of atrial fibrillation on Xarelto, history of stroke with left hemiparesis was bed bound at baseline, hypertension, chronic urinary retention with Foley catheter  chronically for 2 years presents to hospital secondary to altered mental status.  1.  Sepsis- with altered mental status, leukocytosis and hypotension on presentation -Secondary to urinary tract infection.  f/u blood cultures and urine cultures. - IV fluids. Rocephin until culture results are available. -Blood pressure has improved at this time.  2. Hypernatremia-likely secondary to dehydration and free water deficit. -Started on D5 half normal saline and continue to monitor.  Sodium still at 155 will change to D5 and monitor sodium level closely  3. Acute renal failure on CK D stage III-baseline creatinine around 1.7. -IV fluids and monitor. Hold nephrotoxins. Also with hyperkalemia.  - hold entresto  -Creatinine at 2.27 today  4. Chronic anemia-anemia of chronic disease, iron deficiency. Was taking iron supplements as outpatient in the past. We will restart them at this time.  5. Chronic atrial fibrillation-continue Coreg and Cardizem if blood pressure permits. -His Xarelto is being given as 15 mg twice a day-not sure why he is on such a high-dose. -With his creatinine clearance and reason for anticoagulation being A. fib-change the Xarelto to 15 mg daily.  6. H/o CVA-seems to be at baseline. On xarelto and statin  7. DVT Prophylaxis- already on xarelto       All the records are reviewed and case discussed with Care Management/Social Workerr. Management plans discussed with the patient, family and they are in agreement.  CODE STATUS: fc  TOTAL TIME TAKING CARE OF THIS PATIENT:  36 minutes.   POSSIBLE D/C IN 1-2  DAYS, DEPENDING ON CLINICAL CONDITION.  Note: This dictation was prepared with Dragon dictation along with smaller phrase technology. Any transcriptional errors that result from this process are unintentional.   Ramonita Lab M.D on 04/09/2017 at 4:29 PM  Between 7am to 6pm - Pager - [phone] After 6pm go to [email or web address] - password [email or web address] [facility]  [place]  [place] [facility]  Office  [phone]  CC: Primary care physician; Sherron Monday, MD

## 2017-04-10 LAB — BASIC METABOLIC PANEL
ANION GAP: 7 (ref 5–15)
BUN: 66 mg/dL — ABNORMAL HIGH (ref 6–20)
CALCIUM: 7.9 mg/dL — AB (ref 8.9–10.3)
CO2: 23 mmol/L (ref 22–32)
Chloride: 119 mmol/L — ABNORMAL HIGH (ref 101–111)
Creatinine, Ser: 1.98 mg/dL — ABNORMAL HIGH (ref 0.61–1.24)
GFR, EST AFRICAN AMERICAN: 33 mL/min — AB (ref >60)
GFR, EST NON AFRICAN AMERICAN: 29 mL/min — AB (ref >60)
GLUCOSE: 121 mg/dL — AB (ref 65–99)
Potassium: 4.2 mmol/L (ref 3.5–5.1)
SODIUM: 149 mmol/L — AB (ref 135–145)

## 2017-04-10 LAB — HEMOGLOBIN AND HEMATOCRIT, BLOOD
HEMATOCRIT: 27.1 % — AB (ref 40.0–52.0)
HEMOGLOBIN: 9 g/dL — AB (ref 13.0–18.0)

## 2017-04-10 LAB — URINE CULTURE

## 2017-04-10 LAB — CBC
HCT: 21.4 % — ABNORMAL LOW (ref 40.0–52.0)
HEMOGLOBIN: 6.8 g/dL — AB (ref 13.0–18.0)
MCH: 29 pg (ref 26.0–34.0)
MCHC: 31.8 g/dL — AB (ref 32.0–36.0)
MCV: 91 fL (ref 80.0–100.0)
Platelets: 356 10*3/uL (ref 150–440)
RBC: 2.35 MIL/uL — AB (ref 4.40–5.90)
RDW: 19.8 % — ABNORMAL HIGH (ref 11.5–14.5)
WBC: 14.2 10*3/uL — ABNORMAL HIGH (ref 3.8–10.6)

## 2017-04-10 LAB — PREPARE RBC (CROSSMATCH)

## 2017-04-10 MED ORDER — SODIUM CHLORIDE 0.9 % IV SOLN
Freq: Once | INTRAVENOUS | Status: AC
  Administered 2017-04-10: 13:00:00 via INTRAVENOUS

## 2017-04-10 MED ORDER — ENSURE ENLIVE PO LIQD
237.0000 mL | Freq: Three times a day (TID) | ORAL | Status: DC
  Administered 2017-04-10 – 2017-04-16 (×15): 237 mL via ORAL

## 2017-04-10 NOTE — Care Management Note (Signed)
Case Management Note  Patient Details  Name: Jordan PennaDouglas Mossbarger MRN: [id number] Date of Birth: [birth year]  Subjective/Objective:  Ad[facility] to [facility] with the diagnosis of sepsis(UTI). Lives with wife, [name], [phone]). 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 Dr. Zack Seale-Jansie is listed as primary care physician. Prescriptions are filled at [facility] [place].   Home Health per [facility] 10/2015. No skilled facility. No home oxygen.  Rolling walker in the home. Chronic foley. Wife helps with care                  Action/Plan: Physical therapy evaluation pending   Expected Discharge Date:                  Expected Discharge Plan:     In-House Referral:   yes  Discharge planning Services   yes  Post Acute Care Choice:    Choice offered to:     DME Arranged:    DME Agency:     HH Arranged:    HH Agency:     Status of Service:     If discussed at [employer] Meetings, dates discussed:    Additional Comments:  Gwenette GreetBrenda S Deosha Werden, RN MSN CCM Care Management [phone] 04/10/2017, 8:30 AM

## 2017-04-10 NOTE — Care Management Important Message (Signed)
Important Message  Patient Details  Name: Jordan Kline MRN: [id number] Date of Birth: 1927/09/28   Medicare Important Message Given:  Yes    Gwenette GreetBrenda S Tamesha Ellerbrock, RN 04/10/2017, 7:15 AM

## 2017-04-10 NOTE — Progress Notes (Signed)
Initial Nutrition Assessment  DOCUMENTATION CODES:   Severe malnutrition in context of chronic illness, Underweight  INTERVENTION:  Will downgrade diet to patient's home diet of dysphagia 3 (mechanical soft) with thin liquids.  Provide Ensure Enlive po TID, each supplement provides 350 kcal and 20 grams of protein. Patient prefers vanilla.  Continue daily MVI.  NUTRITION DIAGNOSIS:   Severe Malnutrition related to chronic illness(hx CVA, CKD III) as evidenced by severe fat depletion, severe muscle depletion.  GOAL:   Patient will meet greater than or equal to 90% of their needs  MONITOR:   PO intake, Supplement acceptance, Labs, Weight trends, I & O's  REASON FOR ASSESSMENT:   Malnutrition Screening Tool    ASSESSMENT:   84 year old male with PMHx of CVA with residual left-sided hemiparesis who is bed bound at baseline, HTN, CKD III, A-fib, inguinal hernia, urinary retention with chronic foley catheter presented with AMS found to have sepsis, hypernatremia, ARF on CKD III.   Met with patient and his wife at bedside. Wife reports he occasionally has difficulty chewing and swallowing in setting of previous CVA. He is on a mechanical soft diet at home. He also needs assistance at meals now because he has become weak. He attempts to eat small, frequent meals and snacks throughout the day, but is not able to eat much at meals. He drinks Ensure Plus TID at home and takes a daily MVI.  UBW was 150 lbs. RD obtained bed scale weight of 113.2 lbs. Last true measured weight in chart was 119.2 lbs on 03/01/2015. Wife reports his weight loss has been gradual.  Medications reviewed and include: Colace, ferrous sulfate 325 mg BID, MVI daily, ceftriaxone, D5W at 75 mL/hr (90 grams dextrose, 306 kcal daily).  Labs reviewed: Sodium 149, Chloride 119, BUN 66, Creatinine 1.98.  Discussed with RN.  NUTRITION - FOCUSED PHYSICAL EXAM:    Most Recent Value  Orbital Region  Severe depletion   Upper Arm Region  Severe depletion  Thoracic and Lumbar Region  Severe depletion  Buccal Region  Severe depletion  Temple Region  Severe depletion  Clavicle Bone Region  Severe depletion  Clavicle and Acromion Bone Region  Severe depletion  Scapular Bone Region  Severe depletion  Dorsal Hand  Severe depletion  Patellar Region  Severe depletion  Anterior Thigh Region  Severe depletion  Posterior Calf Region  Severe depletion  Edema (RD Assessment)  None  Hair  Reviewed  Eyes  Reviewed  Mouth  Reviewed  Skin  Reviewed  Nails  Reviewed     Diet Order:  Diet Heart Room service appropriate? Yes; Fluid consistency: Thin  EDUCATION NEEDS:   No education needs have been identified at this time  Skin:  Skin Assessment: Reviewed RN Assessment  Last BM:  04/09/2017 - medium type 4  Height:   Ht Readings from Last 1 Encounters:  04/08/17 '5\' 9"'  (1.753 m)    Weight:   Wt Readings from Last 1 Encounters:  04/10/17 113 lb 3.2 oz (51.3 kg)    Ideal Body Weight:  72.7 kg  BMI:  Body mass index is 16.72 kg/m.  Estimated Nutritional Needs:   Kcal:  1540-1800 (30-35 kcal/kg)  Protein:  75-90 grams (1.5-1.7 grams/kg)  Fluid:  1.3 L/day (25 mL/kg)  Willey Blade, MS, RD, LDN Office: [phone] Pager: [phone] After Hours/Weekend Pager: [phone]

## 2017-04-10 NOTE — Progress Notes (Signed)
[facility] - [place] at [facility]   PATIENT NAME: Jordan Kline    MR#:  [id number]  DATE OF BIRTH:  [birth year]  SUBJECTIVE:  CHIEF COMPLAINT: denies is any abdominal pain nausea or vomiting.  Resting comfortably.  No back pain.  Wife at bedside  REVIEW OF SYSTEMS:  CONSTITUTIONAL: No fever, fatigue or weakness.  EYES: No blurred or double vision.  EARS, NOSE, AND THROAT: No tinnitus or ear pain.  RESPIRATORY: No cough, shortness of breath, wheezing or hemoptysis.  CARDIOVASCULAR: No chest pain, orthopnea, edema.  GASTROINTESTINAL: No nausea, vomiting, diarrhea or abdominal pain.  GENITOURINARY: No dysuria, hematuria.  ENDOCRINE: No polyuria, nocturia,  HEMATOLOGY: No anemia, easy bruising or bleeding SKIN: No rash or lesion. MUSCULOSKELETAL: No joint pain or arthritis.   NEUROLOGIC: No tingling, numbness, weakness.  PSYCHIATRY: No anxiety or depression.   DRUG ALLERGIES:  No Known Allergies  VITALS:  Blood pressure 112/71, pulse 72, temperature 99 F (37.2 C), temperature source Oral, resp. rate 17, height 5\' 9"  (1.753 m), weight 51.3 kg (113 lb 3.2 oz), SpO2 98 %.  PHYSICAL EXAMINATION:  GENERAL:  84 y.o.-year-old patient lying in the bed with no acute distress.  EYES: Pupils equal, round, reactive to light and accommodation. No scleral icterus. Extraocular muscles intact.  HEENT: Head atraumatic, normocephalic. Oropharynx and nasopharynx clear.  NECK:  Supple, no jugular venous distention. No thyroid enlargement, no tenderness.  LUNGS: Normal breath sounds bilaterally, no wheezing, rales,rhonchi or crepitation. No use of accessory muscles of respiration.  CARDIOVASCULAR: S1, S2 normal. No murmurs, rubs, or gallops.  ABDOMEN: Soft, nontender, nondistended. Bowel sounds present. No organomegaly or mass.  EXTREMITIES: No pedal edema, cyanosis, or clubbing.  NEUROLOGIC: Cranial nerves II through XII are intact. Muscle strength 5/5 in all  extremities. Sensation intact. Gait not checked.  PSYCHIATRIC: The patient is alert and oriented x 3.  SKIN: No obvious rash, lesion, or ulcer.    LABORATORY PANEL:   CBC Recent Labs  Lab 04/10/17 1016  WBC 14.2*  HGB 6.8*  HCT 21.4*  PLT 356   ------------------------------------------------------------------------------------------------------------------  Chemistries  Recent Labs  Lab 04/08/17 1652  04/10/17 1016  NA 156*   < > 149*  K 5.3*   < > 4.2  CL 122*   < > 119*  CO2 27   < > 23  GLUCOSE 111*   < > 121*  BUN 97*   < > 66*  CREATININE 2.41*   < > 1.98*  CALCIUM 8.3*   < > 7.9*  AST 38  --   --   ALT 40  --   --   ALKPHOS 70  --   --   BILITOT 0.7  --   --    < > = values in this interval not displayed.   ------------------------------------------------------------------------------------------------------------------  Cardiac Enzymes No results for input(s): TROPONINI in the last 168 hours. ------------------------------------------------------------------------------------------------------------------  RADIOLOGY:  Dg Chest Port 1 View  Result Date: 04/08/2017 CLINICAL DATA:  Altered mental status. EXAM: PORTABLE CHEST 1 VIEW COMPARISON:  09/25/2004 FINDINGS: The cardiac silhouette, mediastinal and hilar contours are within normal limits and stable. Mild tortuosity of the thoracic aorta. The lungs are clear. No pleural effusion. The bony thorax is intact. Prominent skin fold noted over the left chest but no pneumothorax. IMPRESSION: No acute cardiopulmonary findings. Electronically Signed   By: Rudie Meyer M.D.   On: 04/08/2017 18:04    EKG:   Orders placed or performed during the  hospital encounter of 04/08/17  . ED EKG 12-Lead  . ED EKG 12-Lead    ASSESSMENT AND PLAN:   Jordan Kline  is a 84 y.o. male with a known history of atrial fibrillation on Xarelto, history of stroke with left hemiparesis was bed bound at baseline, hypertension,  chronic urinary retention with Foley catheter chronically for 2 years presents to hospital secondary to altered mental status.  1.  Sepsis- with altered mental status, leukocytosis and hypotension on presentation  ruled out UTI and Sepsis f/u blood cultures are negative  urine culture with multiple species seems to be contaminated  species. - IV fluids. Discontinue Rocephin . -Blood pressure has improved at this time.  2. Hypernatremia-likely secondary to dehydration and free water deficit. -Sodium  at 155--149  continue D5 and monitor sodium level closely  3. Acute renal failure on CK D stage III-baseline creatinine around 1.7. -IV fluids and monitor. Hold nephrotoxins. Also with hyperkalemia.  - hold entresto  -Creatinine at 2.27--1.98 today  4. Acute on Chronic anemia-anemia of chronic disease, iron deficiency.  Hemoglobin at 6.4-6.8 transfuse 1 unit of blood Holding Xarelto today Repeat CBC in a.m. and check stool for Hemoccult if hemoglobin is stable will resuming Xarelto otherwise further evaluation will be performed Was taking iron supplements as outpatient in the past. We will restart them at this time.  5. Chronic atrial fibrillation-continue Coreg and Cardizem if blood pressure permits. Holding Xarelto tonight in view of anemia  6. H/o CVA-seems to be at baseline. On xarelto and statin  7. DVT Prophylaxis- already on xarelto       All the records are reviewed and case discussed with Care Management/Social Workerr. Management plans discussed with the patient, family and they are in agreement.  CODE STATUS: fc  TOTAL TIME TAKING CARE OF THIS PATIENT:  36 minutes.   POSSIBLE D/C IN 1-2  DAYS, DEPENDING ON CLINICAL CONDITION.  Note: This dictation was prepared with Dragon dictation along with smaller phrase technology. Any transcriptional errors that result from this process are unintentional.   Ramonita LabAruna Antion Andres M.D on 04/10/2017 at 4:51 PM  Between 7am to  6pm - Pager - [phone] After 6pm go to [email or web address] - password [email or web address] [facility]  [place] [facility]  Office  [phone]  CC: Primary care physician; [name], S Ahmed, MD

## 2017-04-10 NOTE — Evaluation (Signed)
Physical Therapy Evaluation Patient Details Name: Jordan PennaDouglas Kline MRN: [id number] DOB: Aug 14, 1927 Today's Date: 04/10/2017   History of Present Illness  84 y/o male here with sepsis, UTI.  84 y.o. male with a known history of atrial fibrillation on Xarelto, history of stroke with left hemiparesis, hypertension, chronic urinary retention with Foley catheter   Clinical Impression  Pt showed good effort t/o PT session despite generally feeling weaker than his baseline.  Per pt and wife he has been able to assist transferring and doing general mobility relatively well until that last 2 weeks.  Pt with old CVA with severe L side limitations but per wife was even walking with cane just a few months ago.  Pt is weaker than his baseline and functionally limited due to this, he and wife feel that some time at rehab will increase his function, mobility, etc and they are eager to try.  Pt showed great effort with ~10 minutes of seated balance/weight shift and light exercises at side of bed.    Follow Up Recommendations SNF    Equipment Recommendations       Recommendations for Other Services       Precautions / Restrictions Precautions Precautions: Fall Restrictions Weight Bearing Restrictions: No      Mobility  Bed Mobility Overal bed mobility: Needs Assistance Bed Mobility: Supine to Sit;Sit to Supine     Supine to sit: Mod assist Sit to supine: Mod assist   General bed mobility comments: Pt showed great effort with getting to EOB, when he was able to get a good grip with R UE he pulled up but ultimately needed help to get to upright/sitting  Transfers Overall transfer level: Needs assistance Equipment used: 1 person hand held assist Transfers: Sit to/from Stand Sit to Stand: Max assist         General transfer comment: Pt made good effort to get to standing with heavy UE assist from PT.  He ultimately was unable to achieve upright despite great effort.    Ambulation/Gait             General Gait Details: not appropraite at attempt today  Stairs            Wheelchair Mobility    Modified Rankin (Stroke Patients Only)       Balance Overall balance assessment: Needs assistance Sitting-balance support: Single extremity supported Sitting balance-Leahy Scale: Fair Sitting balance - Comments: Pt inconsistently able to hold balance at EOB with heavy R UE use, unable to maintain sitting w/o heavy assist                                     Pertinent Vitals/Pain Pain Assessment: No/denies pain    Home Living Family/patient expects to be discharged to:: Skilled nursing facility Living Arrangements: Spouse/significant other                    Prior Function Level of Independence: Needs assistance   Gait / Transfers Assistance Needed: Apparently pt has not been able to ambulate recently, but until a few weeks ago was helping transfer to recliner/chairs relativelyw ell  ADL's / Homemaking Assistance Needed: wife helps with a lot of ADL 2/2 chronic CVA L side weakness.         Hand Dominance        Extremity/Trunk Assessment   Upper Extremity Assessment Upper Extremity Assessment: (R grossly 3+/5, L  no AROM, some tone)    Lower Extremity Assessment Lower Extremity Assessment: Generalized weakness(R grossly 3+/5, L with no AROM)       Communication   Communication: No difficulties  Cognition Arousal/Alertness: Awake/alert Behavior During Therapy: WFL for tasks assessed/performed Overall Cognitive Status: Within Functional Limits for tasks assessed                                        General Comments      Exercises     Assessment/Plan    PT Assessment Patient needs continued PT services  PT Problem List Decreased strength;Decreased range of motion;Decreased activity tolerance;Decreased balance;Decreased mobility;Decreased coordination;Decreased cognition;Decreased knowledge of use of  DME;Decreased safety awareness;Impaired sensation       PT Treatment Interventions DME instruction;Functional mobility training;Gait training;Therapeutic activities;Therapeutic exercise;Balance training;Neuromuscular re-education;Patient/family education    PT Goals (Current goals can be found in the Care Plan section)  Acute Rehab PT Goals Patient Stated Goal: get back to at least transfering to chair, hopefully walking PT Goal Formulation: With patient Time For Goal Achievement: 04/24/17 Potential to Achieve Goals: Fair    Frequency Min 2X/week   Barriers to discharge        Co-evaluation               AM-PAC PT "6 Clicks" Daily Activity  Outcome Measure Difficulty turning over in bed (including adjusting bedclothes, sheets and blankets)?: Unable Difficulty moving from lying on back to sitting on the side of the bed? : Unable Difficulty sitting down on and standing up from a chair with arms (e.g., wheelchair, bedside commode, etc,.)?: Unable Help needed moving to and from a bed to chair (including a wheelchair)?: Total Help needed walking in hospital room?: Total Help needed climbing 3-5 steps with a railing? : Total 6 Click Score: 6    End of Session Equipment Utilized During Treatment: Gait belt Activity Tolerance: Patient limited by fatigue Patient left: with bed alarm set;with call bell/phone within reach;with family/visitor present Nurse Communication: Mobility status PT Visit Diagnosis: Muscle weakness (generalized) (M62.81);Difficulty in walking, not elsewhere classified (R26.2);Hemiplegia and hemiparesis Hemiplegia - Right/Left: Left Hemiplegia - dominant/non-dominant: Non-dominant Hemiplegia - caused by: Cerebral infarction    Time: 1137-1206 PT Time Calculation (min) (ACUTE ONLY): 29 min   Charges:   PT Evaluation $PT Eval Low Complexity: 1 Low PT Treatments $Therapeutic Activity: 8-22 mins   PT G Codes:        Malachi Pro, DPT 04/10/2017,  4:57 PM

## 2017-04-11 LAB — BASIC METABOLIC PANEL
ANION GAP: 9 (ref 5–15)
BUN: 61 mg/dL — ABNORMAL HIGH (ref 6–20)
CO2: 22 mmol/L (ref 22–32)
Calcium: 8.1 mg/dL — ABNORMAL LOW (ref 8.9–10.3)
Chloride: 119 mmol/L — ABNORMAL HIGH (ref 101–111)
Creatinine, Ser: 1.92 mg/dL — ABNORMAL HIGH (ref 0.61–1.24)
GFR calc Af Amer: 34 mL/min — ABNORMAL LOW (ref >60)
GFR, EST NON AFRICAN AMERICAN: 30 mL/min — AB (ref >60)
Glucose, Bld: 106 mg/dL — ABNORMAL HIGH (ref 65–99)
POTASSIUM: 4.5 mmol/L (ref 3.5–5.1)
Sodium: 150 mmol/L — ABNORMAL HIGH (ref 135–145)

## 2017-04-11 LAB — CBC
HCT: 28 % — ABNORMAL LOW (ref 40.0–52.0)
Hemoglobin: 9.3 g/dL — ABNORMAL LOW (ref 13.0–18.0)
MCH: 29.8 pg (ref 26.0–34.0)
MCHC: 33.1 g/dL (ref 32.0–36.0)
MCV: 90.1 fL (ref 80.0–100.0)
PLATELETS: 321 10*3/uL (ref 150–440)
RBC: 3.11 MIL/uL — AB (ref 4.40–5.90)
RDW: 17.2 % — ABNORMAL HIGH (ref 11.5–14.5)
WBC: 19.1 10*3/uL — ABNORMAL HIGH (ref 3.8–10.6)

## 2017-04-11 MED ORDER — SACUBITRIL-VALSARTAN 49-51 MG PO TABS
1.0000 | ORAL_TABLET | Freq: Two times a day (BID) | ORAL | Status: DC
  Administered 2017-04-11 – 2017-04-16 (×11): 1 via ORAL
  Filled 2017-04-11 (×12): qty 1

## 2017-04-11 MED ORDER — RIVAROXABAN 15 MG PO TABS
15.0000 mg | ORAL_TABLET | Freq: Every day | ORAL | Status: DC
  Administered 2017-04-11: 17:00:00 15 mg via ORAL
  Filled 2017-04-11 (×2): qty 1

## 2017-04-11 MED ORDER — CEPHALEXIN 250 MG PO CAPS
250.0000 mg | ORAL_CAPSULE | Freq: Two times a day (BID) | ORAL | Status: DC
  Administered 2017-04-11 – 2017-04-16 (×11): 250 mg via ORAL
  Filled 2017-04-11 (×12): qty 1

## 2017-04-11 MED ORDER — CEPHALEXIN 500 MG PO CAPS
500.0000 mg | ORAL_CAPSULE | Freq: Two times a day (BID) | ORAL | Status: DC
  Filled 2017-04-11: qty 1

## 2017-04-11 NOTE — Progress Notes (Signed)
Pharmacist - Prescriber Communication  Keflex dose modified from 500 mg po BID to 250 mg po BID due to creatinine clearance 15 to 29 mL/min.  Taeja Debellis A. [place], Vermont[place]arm.D., BCPS Clinical Pharmacist 04/11/2017 09:15

## 2017-04-11 NOTE — Progress Notes (Signed)
[facility] [place] [facility]   PATIENT NAME: Jordan Kline    MR#:  [id number]  DATE OF BIRTH:  1927/07/10  SUBJECTIVE:  CHIEF COMPLAINT: denies is any abdominal pain nausea or vomiting.  No complaints today other than weakness.    REVIEW OF SYSTEMS:  CONSTITUTIONAL: No fever, fatigue or weakness.  EYES: No blurred or double vision.  EARS, NOSE, AND THROAT: No tinnitus or ear pain.  RESPIRATORY: No cough, shortness of breath, wheezing or hemoptysis.  CARDIOVASCULAR: No chest pain, orthopnea, edema.  GASTROINTESTINAL: No nausea, vomiting, diarrhea or abdominal pain.  GENITOURINARY: No dysuria, hematuria.  ENDOCRINE: No polyuria, nocturia,  HEMATOLOGY: No anemia, easy bruising or bleeding SKIN: No rash or lesion. MUSCULOSKELETAL: No joint pain or arthritis.   NEUROLOGIC: No tingling, numbness, weakness.  PSYCHIATRY: No anxiety or depression.   DRUG ALLERGIES:  No Known Allergies  VITALS:  Blood pressure 117/71, pulse (!) 107, temperature 98.3 F (36.8 C), temperature source Oral, resp. rate 16, height 5\' 9"  (1.753 m), weight 51.3 kg (113 lb 3.2 oz), SpO2 100 %.  PHYSICAL EXAMINATION:  GENERAL:  84 y.o.-year-old patient lying in the bed with no acute distress.  EYES: Pupils equal, round, reactive to light and accommodation. No scleral icterus. Extraocular muscles intact.  HEENT: Head atraumatic, normocephalic. Oropharynx and nasopharynx clear.  NECK:  Supple, no jugular venous distention. No thyroid enlargement, no tenderness.  LUNGS: Normal breath sounds bilaterally, no wheezing, rales,rhonchi or crepitation. No use of accessory muscles of respiration.  CARDIOVASCULAR: S1, S2 normal. No murmurs, rubs, or gallops.  ABDOMEN: Soft, nontender, nondistended. Bowel sounds present. No organomegaly or mass.  EXTREMITIES: No pedal edema, cyanosis, or clubbing.  NEUROLOGIC: Cranial nerves II through XII are intact. Muscle strength 5/5 in all  extremities. Sensation intact. Gait not checked.  PSYCHIATRIC: The patient is alert and oriented x 3.  SKIN: No obvious rash, lesion, or ulcer.    LABORATORY PANEL:   CBC Recent Labs  Lab 04/11/17 0338  WBC 19.1*  HGB 9.3*  HCT 28.0*  PLT 321   ------------------------------------------------------------------------------------------------------------------  Chemistries  Recent Labs  Lab 04/08/17 1652  04/11/17 0338  NA 156*   < > 150*  K 5.3*   < > 4.5  CL 122*   < > 119*  CO2 27   < > 22  GLUCOSE 111*   < > 106*  BUN 97*   < > 61*  CREATININE 2.41*   < > 1.92*  CALCIUM 8.3*   < > 8.1*  AST 38  --   --   ALT 40  --   --   ALKPHOS 70  --   --   BILITOT 0.7  --   --    < > = values in this interval not displayed.   ------------------------------------------------------------------------------------------------------------------  Cardiac Enzymes No results for input(s): TROPONINI in the last 168 hours. ------------------------------------------------------------------------------------------------------------------  RADIOLOGY:  No results found.  EKG:   Orders placed or performed during the hospital encounter of 04/08/17  . ED EKG 12-Lead  . ED EKG 12-Lead    ASSESSMENT AND PLAN:   Jordan Kline  is a 84 y.o. male with a known history of atrial fibrillation on Xarelto, history of stroke with left hemiparesis was bed bound at baseline, hypertension, chronic urinary retention with Foley catheter chronically for 2 years presents to hospital secondary to altered mental status.  1.  Sepsis- with altered mental status, leukocytosis and hypotension on presentation  ruled out  UTI and Sepsis f/u blood cultures are negative  urine culture with multiple species seems to be contaminated  species. - IV fluids. Discontinue Rocephin .  Patient is started on p.o. Keflex, monitor WBC -Blood pressure has improved at this time.  2. Hypernatremia-likely secondary to  dehydration and free water deficit. -Sodium  at 155--149 --150 continue D5 and monitor sodium level closely  3. Acute renal failure on CK D stage III-baseline creatinine around 1.7. -IV fluids and monitor. Hold nephrotoxins. Also with hyperkalemia.  - hold entresto  -Creatinine at 2.27--1.98--1.92 today  4. Acute on Chronic anemia-anemia of chronic disease, iron deficiency.  Hemoglobin at 6.4-6.8 transfuse 1 unit of blood; hemoglobin is at 9.0-9.3 following blood transfusion I am not quite sure whether a stress hemoglobin result was accurate or not Resuming Xarelto today Repeat CBC in a.m. and check stool for Hemoccult  Was taking iron supplements as outpatient in the past.   5. Chronic atrial fibrillation-continue Coreg and Cardizem if blood pressure permits. resuming Xarelto   6. H/o CVA-seems to be at baseline. On xarelto and statin  7. DVT Prophylaxis-  on xarelto    Disposition skilled nursing care, patient would like to discuss with his  wife   All the records are reviewed and case discussed with Care Management/Social Workerr. Management plans discussed with the patient, family and they are in agreement.  CODE STATUS: fc  TOTAL TIME TAKING CARE OF THIS PATIENT:  36 minutes.   POSSIBLE D/C IN 1-2  DAYS, DEPENDING ON CLINICAL CONDITION.  Note: This dictation was prepared with Dragon dictation along with smaller phrase technology. Any transcriptional errors that result from this process are unintentional.   Ramonita Lab M.D on 04/11/2017 at 3:26 PM  Between 7am to 6pm - Pager - [phone] After 6pm go to [email or web address] - password [email or web address] [facility]  [place] [facility]  Office  [phone]  CC: Primary care physician; Sherron Monday, MD

## 2017-04-11 NOTE — Clinical Social Work Note (Signed)
Clinical Social Work Assessment  Patient Details  Name: Jordan Kline MRN: [id number] Date of Birth: 1927-05-04  Date of referral:  04/11/17               Reason for consult:  Facility Placement                Permission sought to share information with:  Jordan Kline Permission granted to share information::  Yes, Verbal Permission Granted  Name::      Skilled Nursing Facility   Agency::   [place]   Relationship::     Contact Information:     Housing/Transportation Living arrangements for the past 2 months:  Single Family Home Source of Information:  Spouse Patient Interpreter Needed:  None Criminal Activity/Legal Involvement Pertinent to Current Situation/Hospitalization:  No - Comment as needed Significant Relationships:  Spouse Lives with:  Spouse Do you feel safe going back to the place where you live?  Yes Need for family participation in patient care:  Yes (Comment)  Care giving concerns:  Patient lives in [place] with his wife Jordan Kline.    Social Worker assessment / plan:  Visual merchandiserClinical Social Worker (CSW) reviewed chart and noted that PT is recommending SNF. Per chart patient is not alert and oriented so CSW contacted patient's wife Jordan Kline. Per wife patient lives with her in [place]. Wife reported that patient has not walked in months however he can get to a chair with assistance at home. Wife reported that patient is very weak and she would like for patient to go to SNF for short term rehab. Per wife they have 1 adult son Jordan Kline. CSW explained that it will be unlikely that [name] will approve SNF because patient has not walked in months. Wife verbalized her understanding and requested for CSW to still try. Wife prefers [facility]. FL2 complete and faxed out.   CSW presented bed offers to wife and she chose [facility]. [facility] she will start [employer] authorization today. CSW requested for PT to see patient  again to assess ambulation. CSW will continue to follow and assist as needed.    Employment status:  Disabled (Comment on whether or not currently receiving Disability), Retired Database administratornsurance information:  Managed Medicare PT Recommendations:  Skilled Nursing Facility Information / Referral to community resources:  Skilled Nursing Facility  Patient/Family's Response to care:  Patient's wife prefers [facility].   Patient/Family's Understanding of and Emotional Response to Diagnosis, Current Treatment, and Prognosis:  Patient's wife was very pleasant and thanked CSW for assistance.   Emotional Assessment Appearance:  Appears stated age Attitude/Demeanor/Rapport:  Unable to Assess Affect (typically observed):  Unable to Assess Orientation:  Oriented to Self, Oriented to Place, Fluctuating Orientation (Suspected and/or reported Sundowners) Alcohol / Substance use:  Not Applicable Psych involvement (Current and /or in the community):  No (Comment)  Discharge Needs  Concerns to be addressed:  Discharge Planning Concerns Readmission within the last 30 days:  No Current discharge risk:  Dependent with Mobility, Chronically ill Barriers to Discharge:  Continued Medical Work up   [employer], Jordan CrockerBailey M, LCSW 04/11/2017, 1:58 PM

## 2017-04-11 NOTE — Progress Notes (Signed)
Physical Therapy Treatment Patient Details Name: Jordan PennaDouglas Goeller MRN: [id number] DOB: 1927/07/07 Today's Date: 04/11/2017    History of Present Illness 84 y/o male here with sepsis, UTI.  84 y.o. male with a known history of atrial fibrillation on Xarelto, history of stroke with left hemiparesis, hypertension, chronic urinary retention with Foley catheter     PT Comments    Pt performed bed mobility today with mod-max A, using R UE to assist with rolling and attempting to bridge with R LE.  Pt demonstrated ability to sit supervised at EOB with unilateral UE for assistance for 2-3 min.  Pt attempted STS transfer with hemi walker for support but was unable to maintain footing, experiencing his feet sliding forward on floor while receiving bilateral support from PT and NA.  PT and NA transferred pt to chair, total A, on first attempt.  Pt reported no increase in pain following.  PT introduced pt to seated there ex at EOB which pt demonstrated understanding of.  Pt will continue to benefit from skilled PT with focus on strength, functional mobility, tolerance to activity and pain management.   Follow Up Recommendations  SNF     Equipment Recommendations       Recommendations for Other Services       Precautions / Restrictions Precautions Precautions: Fall Restrictions Weight Bearing Restrictions: No    Mobility  Bed Mobility Overal bed mobility: Needs Assistance Bed Mobility: Rolling;Supine to Sit Rolling: Max assist   Supine to sit: Mod assist;HOB elevated Sit to supine: (Did not perform.)   General bed mobility comments: Pt assisted in movement with R LE and holding PT's hand or bed rail.  Pt able to sit at EOB with R unilateral UE support.  Transfers Overall transfer level: Needs assistance Equipment used: 2 person hand held assist Transfers: Sit to/from [employer];Stand Pivot Transfers Sit to Stand: Max assist Stand pivot transfers: Total assist       General transfer  comment: Pt attempted use of hemi walker to transfer but repeatedly lost footing, sliding forward.  PT and NA performed a pivot transfer from bed to chair.  Ambulation/Gait Ambulation/Gait assistance: (Unable to perform)               Stairs            Wheelchair Mobility    Modified Rankin (Stroke Patients Only)       Balance Overall balance assessment: Needs assistance Sitting-balance support: Single extremity supported                                        Cognition Arousal/Alertness: Lethargic Behavior During Therapy: WFL for tasks assessed/performed Overall Cognitive Status: Within Functional Limits for tasks assessed                                        Exercises General Exercises - Lower Extremity Ankle Circles/Pumps: Strengthening;10 reps;Both;Seated Long Arc Quad: Strengthening;Both;10 reps;Seated    General Comments        Pertinent Vitals/Pain Pain Assessment: Faces Faces Pain Scale: Hurts little more Pain Location: Bilateral LE.  Pt's wife states that he experiences pain but no other sensation in L LE. Pain Intervention(s): Limited activity within patient's tolerance    Home Living  Prior Function            PT Goals (current goals can now be found in the care plan section) Acute Rehab PT Goals Patient Stated Goal: get back to at least transfering to chair, hopefully walking PT Goal Formulation: With patient Time For Goal Achievement: 04/24/17 Potential to Achieve Goals: Fair    Frequency    Min 2X/week      PT Plan Discharge plan needs to be updated    Co-evaluation              AM-PAC PT "6 Clicks" Daily Activity  Outcome Measure  Difficulty turning over in bed (including adjusting bedclothes, sheets and blankets)?: A Lot Difficulty moving from lying on back to sitting on the side of the bed? : A Lot Difficulty sitting down on and standing up from a  chair with arms (e.g., wheelchair, bedside commode, etc,.)?: A Lot Help needed moving to and from a bed to chair (including a wheelchair)?: Total Help needed walking in hospital room?: Total Help needed climbing 3-5 steps with a railing? : Total 6 Click Score: 9    End of Session Equipment Utilized During Treatment: Gait belt Activity Tolerance: Patient limited by fatigue(Limited due to L hemiparesis.) Patient left: in chair;with call bell/phone within reach;with chair alarm set;with family/visitor present Nurse Communication: Mobility status PT Visit Diagnosis: Unsteadiness on feet (R26.81);Muscle weakness (generalized) (M62.81);Pain;Hemiplegia and hemiparesis Hemiplegia - Right/Left: Left Pain - Right/Left: Left Pain - part of body: Leg     Time: 1610-9604 PT Time Calculation (min) (ACUTE ONLY): 25 min  Charges:  $Therapeutic Exercise: 8-22 mins $Therapeutic Activity: 8-22 mins                    G Codes:  Functional Assessment Tool Used: AM-PAC 6 Clicks Basic Mobility    Glenetta Hew, PT, DPT   Glenetta Hew 04/11/2017, 4:28 PM

## 2017-04-11 NOTE — Progress Notes (Signed)
ANTICOAGULATION CONSULT NOTE - Initial Consult  Pharmacy Consult for Xarelto Indication: AF  No Known Allergies  Patient Measurements: Height: 5\' 9"  (175.3 cm) Weight: 113 lb 3.2 oz (51.3 kg)(bed scale) IBW/kg (Calculated) : 70.7 Heparin Dosing Weight:   Vital Signs: Temp: 98.3 F (36.8 C) (01/16 0441) Temp Source: Oral (01/16 0441) BP: 117/71 (01/16 0919) Pulse Rate: 107 (01/16 0919)  Labs: Recent Labs    04/09/17 0317 04/10/17 1016 04/10/17 1944 04/11/17 0338  HGB 6.4* 6.8* 9.0* 9.3*  HCT 20.4* 21.4* 27.1* 28.0*  PLT 308 356  --  321  CREATININE 2.27* 1.98*  --  1.92*    Estimated Creatinine Clearance: 19.7 mL/min (A) (by C-G formula based on SCr of 1.92 mg/dL (H)).   Medical History: Past Medical History:  Diagnosis Date  . A-fib (HCC)    On Xarelto  . Chronic kidney disease (CKD)   . High cholesterol   . Hypertension   . Inguinal hernia   . Stroke [facility](HCC)    With left-sided hemiparesis  . Urinary retention    Chronic Foley catheter since December 2016    Medications:  Infusions:  . dextrose 500 mL (04/11/17 0939)    Assessment: 89 yom with clinical sepsis has completed abx. Patient takes Xarelto PTA for AF. Noted acute on chronic anemia. Hgb stable today so hospitalist okays continuing OAC. Pharmacy consulted to continue Xarelto.  Goal of Therapy:  Monitor platelets by anticoagulation protocol: Yes   Plan:  Xarelto 15 mg po once daily with supper due to creatinine clearance 15 to 49 mL/min  Carola FrostNathan A Dillon Livermore, Pharm.D., BCPS Clinical Pharmacist 04/11/2017,11:11 AM

## 2017-04-11 NOTE — Clinical Social Work Placement (Signed)
   CLINICAL SOCIAL WORK PLACEMENT  NOTE  Date:  04/11/2017  Patient Details  Name: Leticia PennaDouglas Kishbaugh MRN: [id number] Date of Birth: [birth year]  Clinical Social Work is seeking post-discharge placement for this patient at the Skilled  Nursing Facility level of care (*CSW will initial, date and re-position this form in  chart as items are completed):  Yes   Patient/family provided with [facility] [place] Clinical Social Work Department's list of facilities offering this level of care within the geographic area requested by the patient (or if unable, by the patient's family).  Yes   Patient/family informed of their freedom to choose among providers that offer the needed level of care, that participate in Medicare, Medicaid or managed care program needed by the patient, have an available bed and are willing to accept the patient.  Yes   Patient/family informed of [facility]'s ownership interest in [facility] and [facility], as well as of the fact that they are under no obligation to receive care at these facilities.  PASRR submitted to EDS on 04/11/17     PASRR number received on 04/11/17     Existing PASRR number confirmed on       FL2 transmitted to all facilities in geographic area requested by pt/family on 04/11/17     FL2 transmitted to all facilities within larger geographic area on       Patient informed that his/her managed care company has contracts with or will negotiate with certain facilities, including the following:        Yes   Patient/family informed of bed offers received.  Patient chooses bed at [facility]     Physician recommends and patient chooses bed at      Patient to be transferred to   on  .  Patient to be transferred to facility by       Patient family notified on   of transfer.  Name of family member notified:        PHYSICIAN       Additional Comment:    _______________________________________________ Anna Livers, Darleen CrockerBailey M,  LCSW 04/11/2017, 1:57 PM

## 2017-04-11 NOTE — [place] FL2 (Signed)
[place] MEDICAID FL2 LEVEL OF CARE SCREENING TOOL     IDENTIFICATION  Patient Name: Jordan Kline Birthdate: [birth year] Sex: male Admission Date (Current Location): 04/08/2017  [place] and [place] Number:  Chiropodist and Address:  [facility], [address], [place], [place] [address]      Provider Number: [id number]  Attending Physician Name and Address:  Ramonita Lab, MD  Relative Name and Phone Number:       Current Level of Care: Hospital Recommended Level of Care: Skilled Nursing Facility Prior Approval Number:    Date Approved/Denied:   PASRR Number: [id number])  Discharge Plan: SNF    Current Diagnoses: Patient Active Problem List   Diagnosis Date Noted  . Sepsis (HCC) 04/08/2017  . Protein-calorie malnutrition, severe 11/09/2015  . GIB (gastrointestinal bleeding) 11/08/2015  . Recurrent UTI 07/12/2015  . UTI (lower urinary tract infection) 03/01/2015  . Urinary retention 03/01/2015    Orientation RESPIRATION BLADDER Height & Weight     Self, Place  Normal Continent Weight: 113 lb 3.2 oz (51.3 kg)(bed scale) Height:  5\' 9"  (175.3 cm)  BEHAVIORAL SYMPTOMS/MOOD NEUROLOGICAL BOWEL NUTRITION STATUS      Continent Diet(Diet: DYS 3 )  AMBULATORY STATUS COMMUNICATION OF NEEDS Skin   Extensive Assist Verbally Normal                       Personal Care Assistance Level of Assistance  Bathing, Feeding, Dressing Bathing Assistance: Limited assistance Feeding assistance: Independent Dressing Assistance: Limited assistance     Functional Limitations Info  Sight, Hearing, Speech Sight Info: Adequate Hearing Info: Adequate Speech Info: Adequate    SPECIAL CARE FACTORS FREQUENCY  PT (By licensed PT), OT (By licensed OT)     PT Frequency: (5) OT Frequency: (5)            Contractures      Additional Factors Info  Code Status, Allergies Code Status Info: (Full Code. ) Allergies Info: (No Known  Allergies. )           Current Medications (04/11/2017):  This is the current hospital active medication list Current Facility-Administered Medications  Medication Dose Route Frequency Provider Last Rate Last Dose  . acetaminophen (TYLENOL) tablet 650 mg  650 mg Oral Q6H PRN Enid Baas, MD   650 mg at 04/11/17 2841   Or  . acetaminophen (TYLENOL) suppository 650 mg  650 mg Rectal Q6H PRN Enid Baas, MD      . atorvastatin (LIPITOR) tablet 80 mg  80 mg Oral QPM Enid Baas, MD   80 mg at 04/10/17 1737  . carvedilol (COREG) tablet 25 mg  25 mg Oral BID Enid Baas, MD   25 mg at 04/11/17 0929  . cephALEXin (KEFLEX) capsule 250 mg  250 mg Oral Q12H Syanne Looney, MD   250 mg at 04/11/17 0929  . dextrose 5 % solution   Intravenous Continuous Kanylah Muench, MD 75 mL/hr at 04/10/17 0409    . diltiazem (CARDIZEM CD) 24 hr capsule 120 mg  120 mg Oral Q1200 Enid Baas, MD   120 mg at 04/10/17 1332  . docusate sodium (COLACE) capsule 100 mg  100 mg Oral BID Enid Baas, MD   100 mg at 04/11/17 0929  . feeding supplement (ENSURE ENLIVE) (ENSURE ENLIVE) liquid 237 mL  237 mL Oral TID BM Teresha Hanks, MD   237 mL at 04/10/17 2056  . ferrous sulfate tablet 325 mg  325 mg  Oral BID WC Enid BaasKalisetti, Radhika, MD   325 mg at 04/11/17 0928  . lubiprostone (AMITIZA) capsule 24 mcg  24 mcg Oral BID WC Enid BaasKalisetti, Radhika, MD   24 mcg at 04/11/17 0934  . multivitamin with minerals tablet 1 tablet  1 tablet Oral Daily Enid BaasKalisetti, Radhika, MD   1 tablet at 04/11/17 0928  . ondansetron (ZOFRAN) tablet 4 mg  4 mg Oral Q6H PRN Enid BaasKalisetti, Radhika, MD       Or  . ondansetron (ZOFRAN) injection 4 mg  4 mg Intravenous Q6H PRN Enid BaasKalisetti, Radhika, MD      . pneumococcal 23 valent vaccine (PNU-IMMUNE) injection 0.5 mL  0.5 mL Intramuscular Tomorrow-1000 Enid BaasKalisetti, Radhika, MD      . Rivaroxaban (XARELTO) tablet 15 mg  15 mg Oral Q supper Ramonita LabGouru, Lashelle Koy, MD         Discharge  Medications: Please see discharge summary for a list of discharge medications.  Relevant Imaging Results:  Relevant Lab Results:   Additional Information (SSN: [id number])  Sample, Darleen CrockerBailey M, LCSW

## 2017-04-12 LAB — CBC
HCT: 22 % — ABNORMAL LOW (ref 40.0–52.0)
Hemoglobin: 7.5 g/dL — ABNORMAL LOW (ref 13.0–18.0)
MCH: 30.3 pg (ref 26.0–34.0)
MCHC: 33.9 g/dL (ref 32.0–36.0)
MCV: 89.5 fL (ref 80.0–100.0)
PLATELETS: 278 10*3/uL (ref 150–440)
RBC: 2.46 MIL/uL — AB (ref 4.40–5.90)
RDW: 17.9 % — AB (ref 11.5–14.5)
WBC: 13.5 10*3/uL — AB (ref 3.8–10.6)

## 2017-04-12 LAB — BASIC METABOLIC PANEL
ANION GAP: 5 (ref 5–15)
BUN: 61 mg/dL — ABNORMAL HIGH (ref 6–20)
CALCIUM: 7.8 mg/dL — AB (ref 8.9–10.3)
CO2: 24 mmol/L (ref 22–32)
Chloride: 117 mmol/L — ABNORMAL HIGH (ref 101–111)
Creatinine, Ser: 1.89 mg/dL — ABNORMAL HIGH (ref 0.61–1.24)
GFR calc Af Amer: 35 mL/min — ABNORMAL LOW (ref >60)
GFR, EST NON AFRICAN AMERICAN: 30 mL/min — AB (ref >60)
Glucose, Bld: 139 mg/dL — ABNORMAL HIGH (ref 65–99)
POTASSIUM: 4.7 mmol/L (ref 3.5–5.1)
SODIUM: 146 mmol/L — AB (ref 135–145)

## 2017-04-12 LAB — OCCULT BLOOD X 1 CARD TO LAB, STOOL: Fecal Occult Bld: POSITIVE — AB

## 2017-04-12 LAB — PREPARE RBC (CROSSMATCH)

## 2017-04-12 MED ORDER — ACETAMINOPHEN 325 MG PO TABS
650.0000 mg | ORAL_TABLET | Freq: Once | ORAL | Status: AC
  Administered 2017-04-12: 650 mg via ORAL
  Filled 2017-04-12: qty 2

## 2017-04-12 MED ORDER — FUROSEMIDE 10 MG/ML IJ SOLN
20.0000 mg | Freq: Once | INTRAMUSCULAR | Status: AC
  Administered 2017-04-12: 20 mg via INTRAVENOUS
  Filled 2017-04-12: qty 2

## 2017-04-12 MED ORDER — SODIUM CHLORIDE 0.9 % IV SOLN
Freq: Once | INTRAVENOUS | Status: AC
  Administered 2017-04-12: 12:00:00 via INTRAVENOUS

## 2017-04-12 NOTE — Progress Notes (Signed)
Patient ID: Jordan Kline, male   DOB: 1927/04/30, 84 y.o.   MRN: [id number]  [employer] PROGRESS NOTE  Jordan Kline JXB:[id number] DOB: 1927/04/30 DOA: 04/08/2017 PCP: [name], S Ahmed, MD  HPI/Subjective: Patient feels okay.  Offers no complaints.  Patient is not the best historian.  Objective: Vitals:   04/12/17 1320 04/12/17 1645  BP: (!) 104/56 100/67  Pulse: 95 83  Resp: 17 16  Temp: 98.2 F (36.8 C) 99.3 F (37.4 C)  SpO2: 98% 97%    Filed Weights   04/08/17 1627 04/10/17 1536  Weight: 61.2 kg (135 lb) 51.3 kg (113 lb 3.2 oz)    ROS: Review of Systems  Unable to perform ROS: Acuity of condition  Respiratory: Negative for shortness of breath.   Cardiovascular: Negative for chest pain.  Gastrointestinal: Negative for abdominal pain.   Exam: Physical Exam  HENT:  Nose: No mucosal edema.  Mouth/Throat: No oropharyngeal exudate or posterior oropharyngeal edema.  Eyes: Conjunctivae, EOM and lids are normal. Pupils are equal, round, and reactive to light.  Neck: No JVD present. Carotid bruit is not present. No edema present. No thyroid mass and no thyromegaly present.  Cardiovascular: S1 normal and S2 normal. Exam reveals no gallop.  No murmur heard. Pulses:      Dorsalis pedis pulses are 2+ on the right side, and 2+ on the left side.  Respiratory: No respiratory distress. He has no wheezes. He has no rhonchi. He has no rales.  GI: Soft. Bowel sounds are normal. There is no tenderness.  Musculoskeletal:       Right ankle: He exhibits no swelling.       Left ankle: He exhibits no swelling.  Lymphadenopathy:    He has no cervical adenopathy.  Neurological: He is alert.  Left arm weakness  Skin: Skin is warm. No rash noted. Nails show no clubbing.  Psychiatric: He has a normal mood and affect.      Data Reviewed: Basic Metabolic Panel: Recent Labs  Lab 04/08/17 1652 04/09/17 0317 04/09/17 1234 04/09/17 1916 04/10/17 1016 04/11/17 0338  04/12/17 0335  NA 156* 157* 155* 153* 149* 150* 146*  K 5.3* 4.9  --   --  4.2 4.5 4.7  CL 122* 126*  --   --  119* 119* 117*  CO2 27 25  --   --  23 22 24   GLUCOSE 111* 181*  --   --  121* 106* 139*  BUN 97* 88*  --   --  66* 61* 61*  CREATININE 2.41* 2.27*  --   --  1.98* 1.92* 1.89*  CALCIUM 8.3* 7.5*  --   --  7.9* 8.1* 7.8*   Liver Function Tests: Recent Labs  Lab 04/08/17 1652  AST 38  ALT 40  ALKPHOS 70  BILITOT 0.7  PROT 6.3*  ALBUMIN 2.2*   CBC: Recent Labs  Lab 04/08/17 1652 04/09/17 0317 04/10/17 1016 04/10/17 1944 04/11/17 0338 04/12/17 0335  WBC 20.1* 16.6* 14.2*  --  19.1* 13.5*  HGB 7.6* 6.4* 6.8* 9.0* 9.3* 7.5*  HCT 24.3* 20.4* 21.4* 27.1* 28.0* 22.0*  MCV 92.8 92.4 91.0  --  90.1 89.5  PLT 413 308 356  --  321 278     Recent Results (from the past 240 hour(s))  Urine culture     Status: Abnormal   Collection Time: 04/08/17  5:24 PM  Result Value Ref Range Status   Specimen Description   Final    URINE, RANDOM Performed  at [facility], [address], [place], [place] [address]    Special Requests   Final    NONE Performed at [facility], [address] [place], [place] [address]    Culture MULTIPLE SPECIES PRESENT, SUGGEST RECOLLECTION (A)  Final   Report Status 04/10/2017 FINAL  Final  Blood Culture (routine x 2)     Status: None (Preliminary result)   Collection Time: 04/08/17  6:11 PM  Result Value Ref Range Status   Specimen Description BLOOD BLOOD RIGHT WRIST  Final   Special Requests   Final    BOTTLES DRAWN AEROBIC AND ANAEROBIC Blood Culture results may not be optimal due to an inadequate volume of blood received in culture bottles   Culture   Final    NO GROWTH 4 DAYS Performed at [facility], [address], [place], [place] [address]    Report Status PENDING  Incomplete  Blood Culture (routine x 2)     Status: None (Preliminary result)   Collection Time: 04/08/17  6:11 PM  Result Value Ref  Range Status   Specimen Description BLOOD RIGHT ANTECUBITAL  Final   Special Requests   Final    BOTTLES DRAWN AEROBIC AND ANAEROBIC Blood Culture adequate volume   Culture   Final    NO GROWTH 4 DAYS Performed at [facility], [address] [place], [place] [address]    Report Status PENDING  Incomplete      Scheduled Meds: . atorvastatin  80 mg Oral QPM  . carvedilol  25 mg Oral BID  . cephALEXin  250 mg Oral Q12H  . diltiazem  120 mg Oral Q1200  . docusate sodium  100 mg Oral BID  . feeding supplement (ENSURE ENLIVE)  237 mL Oral TID BM  . ferrous sulfate  325 mg Oral BID WC  . lubiprostone  24 mcg Oral BID WC  . multivitamin with minerals  1 tablet Oral Daily  . rivaroxaban  15 mg Oral Q supper  . sacubitril-valsartan  1 tablet Oral BID    Assessment/Plan:  1. Symptomatic anemia.  Ferritin on the lower side.  Patient was guaiac positive.  Stop Xarelto.  Patient transfused 1 unit of packed red blood cells on hemoglobin of 7.5 today.  Watch hemoglobin tomorrow. 2. Patient was admitted with suspected sepsis.  Antibiotics Rocephin was given empirically and switched over to Keflex.  Blood cultures are negative.  Urine analysis positive but this could be contamination with chronic Foley.  Asked the nursing staff to change his Foley. 3. Hypernatremia with poor appetite 4. Acute kidney injury on chronic kidney disease stage III 5. Chronic atrial fibrillation hold Xarelto with anemia and guaiac positive. 6. History of CVA with left-sided weakness 7. Weakness.  Physical therapy recommended rehab 8. History of CHF on Entresto  Code Status:     Code Status Orders  (From admission, onward)        Start     Ordered   04/08/17 2117  Full code  Continuous     04/08/17 2116    Code Status History    Date Active Date Inactive Code Status Order ID Comments User Context   11/08/2015 15:56 11/11/2015 19:58 DNR [id number]  Adrian Saran, MD ED   03/01/2015 17:29 03/04/2015  19:07 DNR [id number]  Altamese Dilling, MD Inpatient    Advance Directive Documentation     Most Recent Value  Type of Advance Directive  Healthcare Power of Attorney  Pre-existing out  of facility DNR order (yellow form or pink MOST form)  No data  "MOST" Form in Place?  No data     Family Communication: Spoke with wife at the bedside Disposition Plan: To be determined  Antibiotics:  Keflex  Time spent: 28 minutes  Nitya Cauthon [employer]

## 2017-04-12 NOTE — Progress Notes (Signed)
Clinical Child psychotherapistocial Worker (CSW) contacted patient's son Jordan Kline at his request. CSW made Jordan Kline aware that Jordan Kline is trying to get [employer] [facility] authorization however [name] will likely deny SNF because patient has not walked in months. CSW explained private pay SNF options and how to apply for long term care medicaid. CSW also explained that patient will likely have to go home with home health. Son verbalized his understanding. Patient's wife Jordan Kline is also aware of above.   [name], LCSW [phone]

## 2017-04-13 DIAGNOSIS — R319 Hematuria, unspecified: Secondary | ICD-10-CM

## 2017-04-13 DIAGNOSIS — Z7189 Other specified counseling: Secondary | ICD-10-CM

## 2017-04-13 DIAGNOSIS — N390 Urinary tract infection, site not specified: Secondary | ICD-10-CM

## 2017-04-13 DIAGNOSIS — Z515 Encounter for palliative care: Secondary | ICD-10-CM

## 2017-04-13 LAB — TYPE AND SCREEN
ABO/RH(D): O POS
ANTIBODY SCREEN: NEGATIVE
Unit division: 0
Unit division: 0

## 2017-04-13 LAB — BASIC METABOLIC PANEL
Anion gap: 8 (ref 5–15)
BUN: 57 mg/dL — AB (ref 6–20)
CHLORIDE: 115 mmol/L — AB (ref 101–111)
CO2: 24 mmol/L (ref 22–32)
CREATININE: 1.9 mg/dL — AB (ref 0.61–1.24)
Calcium: 8.1 mg/dL — ABNORMAL LOW (ref 8.9–10.3)
GFR calc Af Amer: 35 mL/min — ABNORMAL LOW (ref >60)
GFR, EST NON AFRICAN AMERICAN: 30 mL/min — AB (ref >60)
Glucose, Bld: 143 mg/dL — ABNORMAL HIGH (ref 65–99)
Potassium: 4.7 mmol/L (ref 3.5–5.1)
SODIUM: 147 mmol/L — AB (ref 135–145)

## 2017-04-13 LAB — CULTURE, BLOOD (ROUTINE X 2)
CULTURE: NO GROWTH
Culture: NO GROWTH
SPECIAL REQUESTS: ADEQUATE

## 2017-04-13 LAB — CBC
HCT: 28.3 % — ABNORMAL LOW (ref 40.0–52.0)
HEMOGLOBIN: 9.3 g/dL — AB (ref 13.0–18.0)
MCH: 30.3 pg (ref 26.0–34.0)
MCHC: 32.8 g/dL (ref 32.0–36.0)
MCV: 92.2 fL (ref 80.0–100.0)
PLATELETS: 308 10*3/uL (ref 150–440)
RBC: 3.07 MIL/uL — AB (ref 4.40–5.90)
RDW: 17 % — ABNORMAL HIGH (ref 11.5–14.5)
WBC: 13.5 10*3/uL — ABNORMAL HIGH (ref 3.8–10.6)

## 2017-04-13 LAB — BPAM RBC
BLOOD PRODUCT EXPIRATION DATE: 201901292359
Blood Product Expiration Date: 201902032359
ISSUE DATE / TIME: 201901151335
ISSUE DATE / TIME: 201901171246
Unit Type and Rh: 5100
Unit Type and Rh: 9500

## 2017-04-13 MED ORDER — DILTIAZEM HCL ER COATED BEADS 120 MG PO CP24
120.0000 mg | ORAL_CAPSULE | Freq: Every day | ORAL | Status: DC
  Administered 2017-04-13 – 2017-04-15 (×3): 120 mg via ORAL
  Filled 2017-04-13 (×4): qty 1

## 2017-04-13 MED ORDER — APIXABAN 2.5 MG PO TABS
2.5000 mg | ORAL_TABLET | Freq: Two times a day (BID) | ORAL | Status: DC
  Administered 2017-04-13 – 2017-04-16 (×7): 2.5 mg via ORAL
  Filled 2017-04-13 (×8): qty 1

## 2017-04-13 MED ORDER — PREDNISONE 20 MG PO TABS
20.0000 mg | ORAL_TABLET | Freq: Every day | ORAL | Status: DC
  Administered 2017-04-14 – 2017-04-16 (×3): 20 mg via ORAL
  Filled 2017-04-13 (×3): qty 1

## 2017-04-13 MED ORDER — DILTIAZEM HCL 30 MG PO TABS: 30.0000 mg | ORAL_TABLET | Freq: Four times a day (QID) | ORAL | Status: DC

## 2017-04-13 NOTE — Progress Notes (Signed)
Neuro: Pt A&O X2. Pt with slurred speech at baseline and deficits from previous CVA. Neuro exam remains as previous shift. Will continue to monitor.    Respiratory:  Pt on RA with O2 sats WNL.  Cardiovascular: Pt remains in sinus rhythm,  BP continues to be WNL. Pt afebrile at this time.   GI/GU: Pt with chronic foley, changed yesterday. Adequate urine output at this time. Last BM today. Pt with good PO intake.    Skin: Skin intact with no s/s of skin breakdown at this time. Pt remains a Q2hr turn.   Pain: Pt with no reports of pain.    Events: NO acute events throughout shift. Pts plan of care to continue with current regimen, Family updated and no further questions at this time.

## 2017-04-13 NOTE — Progress Notes (Signed)
ANTICOAGULATION CONSULT NOTE - Initial Consult  Pharmacy Consult for Eliquis Indication: atrial fibrillation (was on Xarelto PTA but guaiac positive stools, MD wants to switch to Eliquis)  No Known Allergies  Patient Measurements: Height: 5\' 9"  (175.3 cm) Weight: 113 lb 3.2 oz (51.3 kg)(bed scale) IBW/kg (Calculated) : 70.7 Heparin Dosing Weight:   Vital Signs: Temp: 98.6 F (37 C) (01/18 1302) Temp Source: Oral (01/18 0940) BP: 117/75 (01/18 1302) Pulse Rate: 117 (01/18 1302)  Labs: Recent Labs    04/11/17 0338 04/12/17 0335 04/13/17 0557  HGB 9.3* 7.5* 9.3*  HCT 28.0* 22.0* 28.3*  PLT 321 278 308  CREATININE 1.92* 1.89* 1.90*    Estimated Creatinine Clearance: 19.9 mL/min (A) (by C-G formula based on SCr of 1.9 mg/dL (H)).   Medical History: Past Medical History:  Diagnosis Date  . A-fib (HCC)    On Xarelto  . Chronic kidney disease (CKD)   . High cholesterol   . Hypertension   . Inguinal hernia   . Stroke [facility](HCC)    With left-sided hemiparesis  . Urinary retention    Chronic Foley catheter since December 2016    Medications:  Infusions:    Assessment: 89 yom cc symptomatic anemia and guaiac positive stools. Pharmacy consulted to dose Eliquis for AF.  Goal of Therapy:     Plan:  Start Eliquis 2.5 mg po BID.  Carola FrostNathan A Zacharee Gaddie, Pharm.D., BCPS Clinical Pharmacist 04/13/2017,2:25 PM

## 2017-04-13 NOTE — Progress Notes (Signed)
Patient ID: Jordan Kline Marciano, male   DOB: [birth year], 84 y.o.   MRN: [id number]  [employer] PROGRESS NOTE  Jordan Kline [id number] DOB: [birth year] DOA: 04/08/2017 PCP: [name], S Ahmed, MD  HPI/Subjective: Patient feels okay.  Patient complains of little pain in his right elbow.  Still very weak.  No bleeding seen.  Objective: Vitals:   04/13/17 0940 04/13/17 1302  BP: 110/70 117/75  Pulse: 91 (!) 117  Resp: 18 18  Temp: 99 F (37.2 C) 98.6 F (37 C)  SpO2: 95% 100%    Filed Weights   04/08/17 1627 04/10/17 1536  Weight: 61.2 kg (135 lb) 51.3 kg (113 lb 3.2 oz)    ROS: Review of Systems  Unable to perform ROS: Acuity of condition  Respiratory: Negative for shortness of breath.   Cardiovascular: Negative for chest pain.  Gastrointestinal: Negative for abdominal pain.   Exam: Physical Exam  HENT:  Nose: No mucosal edema.  Mouth/Throat: No oropharyngeal exudate or posterior oropharyngeal edema.  Eyes: Conjunctivae, EOM and lids are normal. Pupils are equal, round, and reactive to light.  Neck: No JVD present. Carotid bruit is not present. No edema present. No thyroid mass and no thyromegaly present.  Cardiovascular: S1 normal and S2 normal. Exam reveals no gallop.  No murmur heard. Pulses:      Dorsalis pedis pulses are 2+ on the right side, and 2+ on the left side.  Respiratory: No respiratory distress. He has no wheezes. He has no rhonchi. He has no rales.  GI: Soft. Bowel sounds are normal. There is no tenderness.  Musculoskeletal:       Right elbow: He exhibits swelling.       Right ankle: He exhibits no swelling.       Left ankle: He exhibits no swelling.  Lymphadenopathy:    He has no cervical adenopathy.  Neurological: He is alert.  Left arm weakness  Skin: Skin is warm. No rash noted. Nails show no clubbing.  Psychiatric: He has a normal mood and affect.      Data Reviewed: Basic Metabolic Panel: Recent Labs  Lab 04/09/17 0317   04/09/17 1916 04/10/17 1016 04/11/17 0338 04/12/17 0335 04/13/17 0557  NA 157*   < > 153* 149* 150* 146* 147*  K 4.9  --   --  4.2 4.5 4.7 4.7  CL 126*  --   --  119* 119* 117* 115*  CO2 25  --   --  23 22 24 24   GLUCOSE 181*  --   --  121* 106* 139* 143*  BUN 88*  --   --  66* 61* 61* 57*  CREATININE 2.27*  --   --  1.98* 1.92* 1.89* 1.90*  CALCIUM 7.5*  --   --  7.9* 8.1* 7.8* 8.1*   < > = values in this interval not displayed.   Liver Function Tests: Recent Labs  Lab 04/08/17 1652  AST 38  ALT 40  ALKPHOS 70  BILITOT 0.7  PROT 6.3*  ALBUMIN 2.2*   CBC: Recent Labs  Lab 04/09/17 0317 04/10/17 1016 04/10/17 1944 04/11/17 0338 04/12/17 0335 04/13/17 0557  WBC 16.6* 14.2*  --  19.1* 13.5* 13.5*  HGB 6.4* 6.8* 9.0* 9.3* 7.5* 9.3*  HCT 20.4* 21.4* 27.1* 28.0* 22.0* 28.3*  MCV 92.4 91.0  --  90.1 89.5 92.2  PLT 308 356  --  321 278 308     Recent Results (from the past 240 hour(s))  Urine culture  Status: Abnormal   Collection Time: 04/08/17  5:24 PM  Result Value Ref Range Status   Specimen Description   Final    URINE, RANDOM Performed at [facility], [address] [place], [place] [address]    Special Requests   Final    NONE Performed at [facility], [address] [place], [place] [address]    Culture MULTIPLE SPECIES PRESENT, SUGGEST RECOLLECTION (A)  Final   Report Status 04/10/2017 FINAL  Final  Blood Culture (routine x 2)     Status: None   Collection Time: 04/08/17  6:11 PM  Result Value Ref Range Status   Specimen Description BLOOD BLOOD RIGHT WRIST  Final   Special Requests   Final    BOTTLES DRAWN AEROBIC AND ANAEROBIC Blood Culture results may not be optimal due to an inadequate volume of blood received in culture bottles   Culture   Final    NO GROWTH 5 DAYS Performed at [facility], [address] [place], [place] [address]    Report Status 04/13/2017 FINAL  Final  Blood Culture (routine x 2)      Status: None   Collection Time: 04/08/17  6:11 PM  Result Value Ref Range Status   Specimen Description BLOOD RIGHT ANTECUBITAL  Final   Special Requests   Final    BOTTLES DRAWN AEROBIC AND ANAEROBIC Blood Culture adequate volume   Culture   Final    NO GROWTH 5 DAYS Performed at [facility], [address], [place], [place] [address]    Report Status 04/13/2017 FINAL  Final      Scheduled Meds: . apixaban  2.5 mg Oral BID  . atorvastatin  80 mg Oral QPM  . carvedilol  25 mg Oral BID  . cephALEXin  250 mg Oral Q12H  . diltiazem  120 mg Oral Q1200  . docusate sodium  100 mg Oral BID  . feeding supplement (ENSURE ENLIVE)  237 mL Oral TID BM  . ferrous sulfate  325 mg Oral BID WC  . multivitamin with minerals  1 tablet Oral Daily  . [START ON 04/14/2017] predniSONE  20 mg Oral Q breakfast  . sacubitril-valsartan  1 tablet Oral BID    Assessment/Plan:  1. Symptomatic anemia.  Ferritin on the lower side.  Patient was guaiac positive.  Hemoglobin responded to blood transfusion.  Today's hemoglobin is 9.3.  Watch hemoglobin over the next couple days with restarting blood thinner. 2. Patient was admitted with suspected sepsis.  Antibiotics Rocephin was given empirically and switched over to Keflex.  Blood cultures are negative.  Urine analysis positive but this could be contamination with chronic Foley.  Foley changed yesterday 3. Right elbow bursitis.  Start low-dose prednisone and continue Keflex 4. Hypernatremia with poor appetite 5. Acute kidney injury on chronic kidney disease stage III 6. Chronic atrial fibrillation.  Family concerned about stroke risk and willing to restart Eliquis and monitor hemoglobin over the next couple days. 7. History of CVA with left-sided weakness 8. Weakness.  Physical therapy recommended rehab 9. History of CHF on Entresto  Code Status:     Code Status Orders  (From admission, onward)        Start     Ordered   04/08/17 2117  Full  code  Continuous     04/08/17 2116    Code Status History    Date Active Date Inactive Code Status Order ID Comments User Context   11/08/2015 15:56  11/11/2015 19:58 DNR [id number]  Adrian Saran, MD ED   03/01/2015 17:29 03/04/2015 19:07 DNR [id number]  Altamese Dilling, MD Inpatient    Advance Directive Documentation     Most Recent Value  Type of Advance Directive  Healthcare Power of Attorney  Pre-existing out of facility DNR order (yellow form or pink MOST form)  No data  "MOST" Form in Place?  No data     Family Communication: Spoke with wife at the bedside Disposition Plan: Awaiting for insurance approval to go to rehab  Antibiotics:  Keflex  Time spent: 27 minutes  Jami Bogdanski [employer]

## 2017-04-13 NOTE — Consult Note (Signed)
Consultation Note Date: 04/13/2017   Patient Name: Jordan Kline  DOB: 1927-08-09  MRN: [id number]  Age / Sex: 84 y.o., male  PCP: [name], MD Referring Physician: Loletha Grayer, MD  Reason for Consultation: Establishing goals of care  HPI/Patient Profile: 84 y.o. male  with past medical history of atrial fibrillation on Xarelto, HTN, h/o stroke 84 yo with left sided hemiparesis, chronic foley x 2 years for urinary retention admitted on 04/08/2017 with recurrent UTI with AMS. Hospitalization complicated by symptomatic anemia requiring blood transfusion. On trial of Eliquis at family desire d/t risk of stroke.   Clinical Assessment and Goals of Care: I met today at Jordan Kline bedside along with his wife and daughter-in-law. We discussed his current status as he is weak, lethargic, and with decreased appetite. Wife says that at baseline he can sit on side of the bed and feed himself. He needs assistance to get up on the side of bed and to chair but is able to help and to hold himself up. This is a big change from baseline. They are hopeful for getting him to [facility] for rehab but know this may not be an option and may need to take him back home. If they return home they will need a hospital bed and want PT services at home. Wife's goal is that he will get back to sitting on side of bed, feeding self, and helping with pivot into chair.   We discussed that we are in a period of watchful waiting to see how he tolerates blood thinner and how he recovers from this hospitalization. Family remains hopeful for some improvement but also aware this is not guaranteed. We discussed if it would be helpful for palliative care to follow up when they leave the hospital. Wife says she will think about it. I provided palliative brochure for them to review with outpatient contact info as well. They have no further  questions or concerns at this time.   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 They have been married over 62 years and have 1 son, daughter-in-law, and 2 grandchildren.   Primary Decision Maker NEXT OF KIN wife    SUMMARY OF RECOMMENDATIONS   - Hopeful for improvement  Code Status/Advance Care Planning:  DNR   Symptom Management:   None currently  Palliative Prophylaxis:   Aspiration, Bowel Regimen, Delirium Protocol, Frequent Pain Assessment, Oral Care and Turn Reposition  Additional Recommendations (Limitations, Scope, Preferences):  Full Scope Treatment  Psycho-social/Spiritual:   Desire for further Chaplaincy support:no  Additional Recommendations: Caregiving  Support/Resources  Prognosis:   Prognosis poor. May be approaching hospice eligibility.   Discharge Planning: To Be Determined      Primary Diagnoses: Present on Admission: . Sepsis ([place])   I have reviewed the medical record, interviewed the patient and family, and examined the patient. The following aspects are pertinent.  Past Medical History:  Diagnosis Date  . A-fib (  [place])    On Xarelto  . Chronic kidney disease (CKD)   . High cholesterol   . Hypertension   . Inguinal hernia   . Stroke [facility])    With left-sided hemiparesis  . Urinary retention    Chronic Foley catheter since December 2016   Social History   Socioeconomic History  . Marital status: Married    Spouse name: None  . Number of children: None  . Years of education: None  . Highest education level: None  Social Needs  . Financial resource strain: Patient refused  . Food insecurity - worry: Patient refused  . Food insecurity - inability: Patient refused  . Transportation needs - medical: Patient refused  . Transportation needs - non-medical: Patient refused  Occupational History  . None  Tobacco Use  . Smoking status: Former Research scientist (life sciences)  . Smokeless  tobacco: Never Used  . Tobacco comment: quit 50 years  Substance and Sexual Activity  . Alcohol use: No    Alcohol/week: 0.0 oz  . Drug use: No  . Sexual activity: No  Other Topics Concern  . None  Social History Narrative   Lives at home with his wife. Bed bound at baseline.   Family History  Problem Relation Age of Onset  . Hypertension Father   . Kidney cancer Neg Hx   . Kidney disease Neg Hx   . Prostate cancer Neg Hx    Scheduled Meds: . apixaban  2.5 mg Oral BID  . atorvastatin  80 mg Oral QPM  . carvedilol  25 mg Oral BID  . cephALEXin  250 mg Oral Q12H  . diltiazem  120 mg Oral Q1200  . docusate sodium  100 mg Oral BID  . feeding supplement (ENSURE ENLIVE)  237 mL Oral TID BM  . ferrous sulfate  325 mg Oral BID WC  . multivitamin with minerals  1 tablet Oral Daily  . [START ON 04/14/2017] predniSONE  20 mg Oral Q breakfast  . sacubitril-valsartan  1 tablet Oral BID   Continuous Infusions: PRN Meds:.acetaminophen **OR** acetaminophen, ondansetron **OR** ondansetron (ZOFRAN) IV No Known Allergies Review of Systems  Constitutional: Positive for activity change, appetite change and fatigue.  Neurological: Positive for weakness.    Physical Exam  Constitutional: He appears well-developed and well-nourished.  Elderly, frail  HENT:  Head: Normocephalic.  Cardiovascular: Normal rate. An irregularly irregular rhythm present.  Pulmonary/Chest: Effort normal. No accessory muscle usage. No tachypnea. No respiratory distress.  Abdominal: Normal appearance.  Neurological:  Slept through my conversation. When he awoke he would only answer my questions when prompted by his wife.   Nursing note and vitals reviewed.   Vital Signs: BP 117/75 (BP Location: Right Arm)   Pulse (!) 117   Temp 98.6 F (37 C)   Resp 18   Ht '5\' 9"'  (1.753 m)   Wt 51.3 kg (113 lb 3.2 oz) Comment: bed scale  SpO2 100%   BMI 16.72 kg/m  Pain Assessment: No/denies pain   Pain Score: 0-No  pain   SpO2: SpO2: 100 % O2 Device:SpO2: 100 % O2 Flow Rate: .O2 Flow Rate (L/min): 2 L/min  IO: Intake/output summary:   Intake/Output Summary (Last 24 hours) at 04/13/2017 1616 Last data filed at 04/13/2017 1346 Gross per 24 hour  Intake 620 ml  Output 1100 ml  Net -480 ml    LBM: Last BM Date: 04/13/17 Baseline Weight: Weight: 61.2 kg (135 lb) Most recent weight: Weight: 51.3 kg (113 lb 3.2 oz)(bed  scale)     Palliative Assessment/Data:    Time Total: 45 min  Greater than 50%  of this time was spent counseling and coordinating care related to the above assessment and plan.  Signed by: Vinie Sill, NP Palliative Medicine Team Pager # [phone] (M-F 8a-5p) Team Phone # [phone] (Nights/Weekends)

## 2017-04-13 NOTE — Progress Notes (Signed)
Physical Therapy Treatment Patient Details Name: Jordan Kline MRN: [id number] DOB: 11-25-1927 Today's Date: 04/13/2017    History of Present Illness 84 y/o male here with sepsis, UTI.  84 y.o. male with a known history of atrial fibrillation on Xarelto, history of stroke with left hemiparesis, hypertension, chronic urinary retention with Foley catheter     PT Comments    Pt remains motivated to work with PT but is very limited with mobility/standing tasks.  He was able to do some A/AAROM with L side but is very limited, R LE weak but functional.  Pt continues to report he is eager to walk again, PT did indicate that this would take quite a bit of difficult work.  He was unable to truly assist with standing on 2 attempts, but ultimately continues to show good effort with PT.  Wife eager for him to work with PT despite understanding that walking is unlikely he was recently able to assist with transfers and this is something she needs him to be able to do to be safe at home, she is hoping he can at least get back to assisting with this.    Follow Up Recommendations  SNF(per progress)     Equipment Recommendations       Recommendations for Other Services       Precautions / Restrictions Precautions Precautions: Fall Restrictions Weight Bearing Restrictions: No    Mobility  Bed Mobility Overal bed mobility: Needs Assistance Bed Mobility: Rolling;Supine to Sit;Sit to Supine Rolling: Mod assist   Supine to sit: Mod assist Sit to supine: Max assist   General bed mobility comments: Pt again able to help with R UE for some of transition, but still needing assist. Pt able to hold himself sitting at EOB with only CGA after considerable assist to keep him upright.  (pt leaning to the L t/o much of the effort)  Transfers Overall transfer level: Needs assistance Equipment used: 2 person hand held assist Transfers: Sit to/from [employer];Stand Pivot Transfers Sit to Stand: Max assist Stand  pivot transfers: Total assist       General transfer comment: 2 x attempt to stand with minimal success, again pt unable to keep L foot on floor despite assist from PT to keep it from sliding.  Pt unable to get weight over feet or effectively assist to stand.  Total assist to and from recliner.  Ambulation/Gait                 Stairs            Wheelchair Mobility    Modified Rankin (Stroke Patients Only)       Balance Overall balance assessment: Needs assistance Sitting-balance support: Single extremity supported Sitting balance-Leahy Scale: Fair       Standing balance-Leahy Scale: Zero                              Cognition Arousal/Alertness: Awake/alert Behavior During Therapy: WFL for tasks assessed/performed Overall Cognitive Status: (near baseline - some delay in response)                                        Exercises General Exercises - Lower Extremity Ankle Circles/Pumps: PROM;AROM;10 reps(PROM on L) Heel Slides: AROM;10 reps;AAROM(AAROM on L) Hip ABduction/ADduction: AROM;AAROM;10 reps(AAROM on L) Straight Leg Raises: AROM;Right;10 reps  General Comments        Pertinent Vitals/Pain Pain Assessment: (c/o R elbow pain, unrated)    Home Living                      Prior Function            PT Goals (current goals can now be found in the care plan section) Progress towards PT goals: Progressing toward goals    Frequency    Min 2X/week      PT Plan Current plan remains appropriate    Co-evaluation              AM-PAC PT "6 Clicks" Daily Activity  Outcome Measure  Difficulty turning over in bed (including adjusting bedclothes, sheets and blankets)?: A Lot Difficulty moving from lying on back to sitting on the side of the bed? : Unable Difficulty sitting down on and standing up from a chair with arms (e.g., wheelchair, bedside commode, etc,.)?: Unable Help needed moving to and  from a bed to chair (including a wheelchair)?: Total Help needed walking in hospital room?: Total Help needed climbing 3-5 steps with a railing? : Total 6 Click Score: 7    End of Session Equipment Utilized During Treatment: Gait belt Activity Tolerance: Patient tolerated treatment well;Patient limited by fatigue Patient left: with call bell/phone within reach;with bed alarm set Nurse Communication: Mobility status PT Visit Diagnosis: Unsteadiness on feet (R26.81);Muscle weakness (generalized) (M62.81);Pain;Hemiplegia and hemiparesis Hemiplegia - Right/Left: Left Hemiplegia - dominant/non-dominant: Non-dominant Hemiplegia - caused by: Cerebral infarction Pain - Right/Left: Left Pain - part of body: Leg     Time: 1610-9604 PT Time Calculation (min) (ACUTE ONLY): 29 min  Charges:  $Therapeutic Exercise: 8-22 mins $Therapeutic Activity: 8-22 mins                    G Codes:       Malachi Pro, DPT 04/13/2017, 12:54 PM

## 2017-04-14 LAB — BASIC METABOLIC PANEL
Anion gap: 8 (ref 5–15)
BUN: 56 mg/dL — AB (ref 6–20)
CALCIUM: 8 mg/dL — AB (ref 8.9–10.3)
CHLORIDE: 117 mmol/L — AB (ref 101–111)
CO2: 25 mmol/L (ref 22–32)
CREATININE: 1.71 mg/dL — AB (ref 0.61–1.24)
GFR calc non Af Amer: 34 mL/min — ABNORMAL LOW (ref >60)
GFR, EST AFRICAN AMERICAN: 40 mL/min — AB (ref >60)
Glucose, Bld: 124 mg/dL — ABNORMAL HIGH (ref 65–99)
Potassium: 5.1 mmol/L (ref 3.5–5.1)
Sodium: 150 mmol/L — ABNORMAL HIGH (ref 135–145)

## 2017-04-14 LAB — HEMOGLOBIN: Hemoglobin: 9.3 g/dL — ABNORMAL LOW (ref 13.0–18.0)

## 2017-04-14 MED ORDER — SODIUM CHLORIDE 0.9 % IV SOLN
300.0000 mg | Freq: Once | INTRAVENOUS | Status: AC
  Administered 2017-04-14: 300 mg via INTRAVENOUS
  Filled 2017-04-14: qty 15

## 2017-04-14 MED ORDER — CARVEDILOL 3.125 MG PO TABS
6.2500 mg | ORAL_TABLET | Freq: Two times a day (BID) | ORAL | Status: DC
  Administered 2017-04-14 – 2017-04-16 (×4): 6.25 mg via ORAL
  Filled 2017-04-14 (×4): qty 2

## 2017-04-14 NOTE — Progress Notes (Signed)
Patient ID: Jordan PennaDouglas Recinos, male   DOB: Nov 30, 1927, 84 y.o.   MRN: [id number]  [facility] PROGRESS NOTE  Jordan Kline [id number] DOB: Nov 30, 1927 DOA: 04/08/2017 PCP: [name], S Ahmed, MD  HPI/Subjective: Patient feels okay.  Patient complains of little pain in his right elbow.  Still very weak.  No bleeding seen.  Objective: Vitals:   04/14/17 0828 04/14/17 1355  BP: 102/65 (!) 81/52  Pulse: 97 (!) 106  Resp:  (!) 32  Temp: 99.3 F (37.4 C) 98.4 F (36.9 C)  SpO2: 98% 97%    Filed Weights   04/08/17 1627 04/10/17 1536  Weight: 61.2 kg (135 lb) 51.3 kg (113 lb 3.2 oz)    ROS: Review of Systems  Unable to perform ROS: Acuity of condition  Respiratory: Negative for shortness of breath.   Cardiovascular: Negative for chest pain.  Gastrointestinal: Negative for abdominal pain.   Exam: Physical Exam  HENT:  Nose: No mucosal edema.  Mouth/Throat: No oropharyngeal exudate or posterior oropharyngeal edema.  Eyes: Conjunctivae, EOM and lids are normal. Pupils are equal, round, and reactive to light.  Neck: No JVD present. Carotid bruit is not present. No edema present. No thyroid mass and no thyromegaly present.  Cardiovascular: S1 normal and S2 normal. Exam reveals no gallop.  No murmur heard. Pulses:      Dorsalis pedis pulses are 2+ on the right side, and 2+ on the left side.  Respiratory: No respiratory distress. He has no wheezes. He has no rhonchi. He has no rales.  GI: Soft. Bowel sounds are normal. There is no tenderness.  Musculoskeletal:       Right elbow: He exhibits swelling.       Right ankle: He exhibits no swelling.       Left ankle: He exhibits no swelling.  Lymphadenopathy:    He has no cervical adenopathy.  Neurological: He is alert.  Left arm weakness  Skin: Skin is warm. No rash noted. Nails show no clubbing.  Psychiatric: He has a normal mood and affect.      Data Reviewed: Basic Metabolic Panel: Recent Labs  Lab 04/10/17 1016  04/11/17 0338 04/12/17 0335 04/13/17 0557 04/14/17 0433  NA 149* 150* 146* 147* 150*  K 4.2 4.5 4.7 4.7 5.1  CL 119* 119* 117* 115* 117*  CO2 23 22 24 24 25   GLUCOSE 121* 106* 139* 143* 124*  BUN 66* 61* 61* 57* 56*  CREATININE 1.98* 1.92* 1.89* 1.90* 1.71*  CALCIUM 7.9* 8.1* 7.8* 8.1* 8.0*   Liver Function Tests: Recent Labs  Lab 04/08/17 1652  AST 38  ALT 40  ALKPHOS 70  BILITOT 0.7  PROT 6.3*  ALBUMIN 2.2*   CBC: Recent Labs  Lab 04/09/17 0317 04/10/17 1016 04/10/17 1944 04/11/17 0338 04/12/17 0335 04/13/17 0557 04/14/17 0433  WBC 16.6* 14.2*  --  19.1* 13.5* 13.5*  --   HGB 6.4* 6.8* 9.0* 9.3* 7.5* 9.3* 9.3*  HCT 20.4* 21.4* 27.1* 28.0* 22.0* 28.3*  --   MCV 92.4 91.0  --  90.1 89.5 92.2  --   PLT 308 356  --  321 278 308  --      Recent Results (from the past 240 hour(s))  Urine culture     Status: Abnormal   Collection Time: 04/08/17  5:24 PM  Result Value Ref Range Status   Specimen Description   Final    URINE, RANDOM Performed at [facility], [address] [place], [place] [address]  Special Requests   Final    NONE Performed at [facility], [address] [place], [place] [address]    Culture MULTIPLE SPECIES PRESENT, SUGGEST RECOLLECTION (A)  Final   Report Status 04/10/2017 FINAL  Final  Blood Culture (routine x 2)     Status: None   Collection Time: 04/08/17  6:11 PM  Result Value Ref Range Status   Specimen Description BLOOD BLOOD RIGHT WRIST  Final   Special Requests   Final    BOTTLES DRAWN AEROBIC AND ANAEROBIC Blood Culture results may not be optimal due to an inadequate volume of blood received in culture bottles   Culture   Final    NO GROWTH 5 DAYS Performed at [facility], [address], [place], [place] [address]    Report Status 04/13/2017 FINAL  Final  Blood Culture (routine x 2)     Status: None   Collection Time: 04/08/17  6:11 PM  Result Value Ref Range Status   Specimen  Description BLOOD RIGHT ANTECUBITAL  Final   Special Requests   Final    BOTTLES DRAWN AEROBIC AND ANAEROBIC Blood Culture adequate volume   Culture   Final    NO GROWTH 5 DAYS Performed at [facility], [address] [place], [place] [address]    Report Status 04/13/2017 FINAL  Final      Scheduled Meds: . apixaban  2.5 mg Oral BID  . atorvastatin  80 mg Oral QPM  . carvedilol  25 mg Oral BID  . cephALEXin  250 mg Oral Q12H  . diltiazem  120 mg Oral Q1200  . docusate sodium  100 mg Oral BID  . feeding supplement (ENSURE ENLIVE)  237 mL Oral TID BM  . ferrous sulfate  325 mg Oral BID WC  . multivitamin with minerals  1 tablet Oral Daily  . predniSONE  20 mg Oral Q breakfast  . sacubitril-valsartan  1 tablet Oral BID    Assessment/Plan:  1. Symptomatic anemia.  Hemoglobin stable today.  Check another hemoglobin tomorrow with being on Eliquis. 2. Patient was admitted with suspected sepsis.  Antibiotics Rocephin was given empirically and switched over to Keflex.  Blood cultures are negative.  Urine analysis positive but this could be contamination with chronic Foley.  Foley changed.  3. Right elbow bursitis.  Started low-dose prednisone and continue Keflex.  Right elbow a little bit better today 4. Hypernatremia with poor appetite 5. Acute kidney injury on chronic kidney disease stage III 6. Chronic atrial fibrillation.  Family concerned about stroke risk and willing to restart Eliquis and monitor hemoglobin over the next couple days.  7. History of CVA with left-sided weakness 8. Weakness.  Physical therapy recommended rehab 9. History of CHF on Entresto  Code Status:     Code Status Orders  (From admission, onward)        Start     Ordered   04/08/17 2117  Full code  Continuous     04/08/17 2116    Code Status History    Date Active Date Inactive Code Status Order ID Comments User Context   11/08/2015 15:56 11/11/2015 19:58 DNR [id number]  Adrian Saran, MD ED    03/01/2015 17:29 03/04/2015 19:07 DNR [id number]  Altamese Dilling, MD Inpatient    Advance Directive Documentation     Most Recent Value  Type of Advance Directive  Healthcare Power of Attorney  Pre-existing out of facility DNR order (yellow form or pink MOST form)  No data  "MOST" Form in Place?  No data     Family Communication: Spoke with wife at the bedside Disposition Plan: Awaiting for insurance approval to go to rehab  Antibiotics:  Keflex  Time spent: 25 minutes  Yorel Redder [employer]

## 2017-04-14 NOTE — Plan of Care (Signed)
  Progressing Urinary Elimination: Signs and symptoms of infection will decrease 04/14/2017 0435 - Progressing by Florinda MarkerWelch, Marvon Shillingburg T, RN Health Behavior/Discharge Planning: Ability to manage health-related needs will improve 04/14/2017 0435 - Progressing by Florinda MarkerWelch, Jnya Brossard T, RN Clinical Measurements: Ability to maintain clinical measurements within normal limits will improve 04/14/2017 0435 - Progressing by Florinda MarkerWelch, Girolamo Lortie T, RN Will remain free from infection 04/14/2017 0435 - Progressing by Florinda MarkerWelch, Clifford Benninger T, RN Diagnostic test results will improve 04/14/2017 0435 - Progressing by Florinda MarkerWelch, Mendy Chou T, RN Nutrition: Adequate nutrition will be maintained 04/14/2017 0435 - Progressing by Florinda MarkerWelch, Joee Iovine T, RN Elimination: Will not experience complications related to urinary retention 04/14/2017 0435 - Progressing by Florinda MarkerWelch, Lalita Ebel T, RN Pain Managment: General experience of comfort will improve 04/14/2017 0435 - Progressing by Florinda MarkerWelch, Bentley Fissel T, RN Safety: Ability to remain free from injury will improve 04/14/2017 0435 - Progressing by Florinda MarkerWelch, Berwyn Bigley T, RN Skin Integrity: Risk for impaired skin integrity will decrease 04/14/2017 0435 - Progressing by Florinda MarkerWelch, Kamyiah Colantonio T, RN

## 2017-04-15 LAB — CBC
HEMATOCRIT: 29.2 % — AB (ref 40.0–52.0)
Hemoglobin: 9.8 g/dL — ABNORMAL LOW (ref 13.0–18.0)
MCH: 31.4 pg (ref 26.0–34.0)
MCHC: 33.7 g/dL (ref 32.0–36.0)
MCV: 93.2 fL (ref 80.0–100.0)
Platelets: 293 10*3/uL (ref 150–440)
RBC: 3.13 MIL/uL — AB (ref 4.40–5.90)
RDW: 18.4 % — ABNORMAL HIGH (ref 11.5–14.5)
WBC: 16.6 10*3/uL — AB (ref 3.8–10.6)

## 2017-04-15 MED ORDER — SALINE SPRAY 0.65 % NA SOLN
1.0000 | NASAL | Status: DC | PRN
  Filled 2017-04-15: qty 44

## 2017-04-15 NOTE — Progress Notes (Signed)
Patient ID: Jordan Kline, male   DOB: 05/25/1927, 84 y.o.   MRN: [id number]  Patient ID: Jordan Kline, male   DOB: 05/25/1927, 84 y.o.   MRN: [id number]  [employer] PROGRESS NOTE  Jordan PennaDouglas Klingler [id number] DOB: 05/25/1927 DOA: 04/08/2017 PCP: [name], S Ahmed, MD  HPI/Subjective: Patient feels okay.  Patient complains of little pain in his right elbow.  Still very weak.  No bleeding seen.  Objective: Vitals:   04/15/17 0459 04/15/17 1034  BP: 96/61 112/68  Pulse: 93 (!) 104  Resp: 18 (!) 28  Temp: 98.7 F (37.1 C) 98.8 F (37.1 C)  SpO2: 95% 97%    Filed Weights   04/08/17 1627 04/10/17 1536  Weight: 61.2 kg (135 lb) 51.3 kg (113 lb 3.2 oz)    ROS: Review of Systems  Unable to perform ROS: Acuity of condition  Respiratory: Negative for shortness of breath.   Cardiovascular: Negative for chest pain.  Gastrointestinal: Negative for abdominal pain.   Exam: Physical Exam  HENT:  Nose: No mucosal edema.  Mouth/Throat: No oropharyngeal exudate or posterior oropharyngeal edema.  Eyes: Conjunctivae, EOM and lids are normal. Pupils are equal, round, and reactive to light.  Neck: No JVD present. Carotid bruit is not present. No edema present. No thyroid mass and no thyromegaly present.  Cardiovascular: S1 normal and S2 normal. Exam reveals no gallop.  No murmur heard. Pulses:      Dorsalis pedis pulses are 2+ on the right side, and 2+ on the left side.  Respiratory: No respiratory distress. He has no wheezes. He has no rhonchi. He has no rales.  GI: Soft. Bowel sounds are normal. There is no tenderness.  Musculoskeletal:       Right elbow: He exhibits swelling.       Right ankle: He exhibits no swelling.       Left ankle: He exhibits no swelling.  Lymphadenopathy:    He has no cervical adenopathy.  Neurological: He is alert.  Left arm weakness  Skin: Skin is warm. No rash noted. Nails show no clubbing.  Psychiatric: He has a normal mood and affect.       Data Reviewed: Basic Metabolic Panel: Recent Labs  Lab 04/10/17 1016 04/11/17 0338 04/12/17 0335 04/13/17 0557 04/14/17 0433  NA 149* 150* 146* 147* 150*  K 4.2 4.5 4.7 4.7 5.1  CL 119* 119* 117* 115* 117*  CO2 23 22 24 24 25   GLUCOSE 121* 106* 139* 143* 124*  BUN 66* 61* 61* 57* 56*  CREATININE 1.98* 1.92* 1.89* 1.90* 1.71*  CALCIUM 7.9* 8.1* 7.8* 8.1* 8.0*   Liver Function Tests: Recent Labs  Lab 04/08/17 1652  AST 38  ALT 40  ALKPHOS 70  BILITOT 0.7  PROT 6.3*  ALBUMIN 2.2*   CBC: Recent Labs  Lab 04/10/17 1016 04/10/17 1944 04/11/17 0338 04/12/17 0335 04/13/17 0557 04/14/17 0433 04/15/17 0420  WBC 14.2*  --  19.1* 13.5* 13.5*  --  16.6*  HGB 6.8* 9.0* 9.3* 7.5* 9.3* 9.3* 9.8*  HCT 21.4* 27.1* 28.0* 22.0* 28.3*  --  29.2*  MCV 91.0  --  90.1 89.5 92.2  --  93.2  PLT 356  --  321 278 308  --  293     Recent Results (from the past 240 hour(s))  Urine culture     Status: Abnormal   Collection Time: 04/08/17  5:24 PM  Result Value Ref Range Status   Specimen Description   Final    URINE,  RANDOM Performed at [facility], [address] [place], [place] [address]    Special Requests   Final    NONE Performed at [facility], [address] [place], [place] [address]    Culture MULTIPLE SPECIES PRESENT, SUGGEST RECOLLECTION (A)  Final   Report Status 04/10/2017 FINAL  Final  Blood Culture (routine x 2)     Status: None   Collection Time: 04/08/17  6:11 PM  Result Value Ref Range Status   Specimen Description BLOOD BLOOD RIGHT WRIST  Final   Special Requests   Final    BOTTLES DRAWN AEROBIC AND ANAEROBIC Blood Culture results may not be optimal due to an inadequate volume of blood received in culture bottles   Culture   Final    NO GROWTH 5 DAYS Performed at [facility], [address], [place], [place] [address]    Report Status 04/13/2017 FINAL  Final  Blood Culture (routine x 2)     Status: None    Collection Time: 04/08/17  6:11 PM  Result Value Ref Range Status   Specimen Description BLOOD RIGHT ANTECUBITAL  Final   Special Requests   Final    BOTTLES DRAWN AEROBIC AND ANAEROBIC Blood Culture adequate volume   Culture   Final    NO GROWTH 5 DAYS Performed at [facility], [address] [place], [place] [address]    Report Status 04/13/2017 FINAL  Final      Scheduled Meds: . apixaban  2.5 mg Oral BID  . atorvastatin  80 mg Oral QPM  . carvedilol  6.25 mg Oral BID  . cephALEXin  250 mg Oral Q12H  . diltiazem  120 mg Oral Q1200  . docusate sodium  100 mg Oral BID  . feeding supplement (ENSURE ENLIVE)  237 mL Oral TID BM  . ferrous sulfate  325 mg Oral BID WC  . multivitamin with minerals  1 tablet Oral Daily  . predniSONE  20 mg Oral Q breakfast  . sacubitril-valsartan  1 tablet Oral BID    Assessment/Plan:  1. Symptomatic anemia.  Hemoglobin up to 9.8 after transfusion and IV iron.  Check another hemoglobin tomorrow with being on Eliquis. 2. Patient was admitted with suspected sepsis.  Antibiotics Rocephin was given empirically and switched over to Keflex.  Blood cultures are negative.  Urine analysis positive but this could be contamination with chronic Foley.  Foley changed.  3. Right elbow bursitis.  Started low-dose prednisone and continue Keflex.  Right elbow a little bit better today 4. Hypernatremia with poor appetite 5. Acute kidney injury on chronic kidney disease stage III 6. Chronic atrial fibrillation.  Family concerned about stroke risk and willing to restart Eliquis and monitor hemoglobin over the next couple days.  7. History of CVA with left-sided weakness 8. Weakness.  Physical therapy recommended rehab 9. History of CHF on Entresto  Code Status:     Code Status Orders  (From admission, onward)        Start     Ordered   04/08/17 2117  Full code  Continuous     04/08/17 2116    Code Status History    Date Active Date Inactive  Code Status Order ID Comments User Context   11/08/2015 15:56 11/11/2015 19:58 DNR [id number]  Adrian Saran, MD ED   03/01/2015 17:29 03/04/2015 19:07 DNR [id number]  Altamese Dilling, MD Inpatient    Advance Directive Documentation     Most Recent Value  Type of Advance Directive  Healthcare Power of Attorney  Pre-existing out of facility DNR order (yellow form or pink MOST form)  No data  "MOST" Form in Place?  No data     Family Communication: Spoke with wife at the bedside yesterday Disposition Plan: Awaiting for insurance approval to go to rehab  Antibiotics:  Keflex  Time spent: 24 minutes  Damaya Channing [employer]

## 2017-04-15 NOTE — Progress Notes (Signed)
Pt's RR is elevated this morning. Pt denies feeling SOB and spo2 is 97% on RA. Lung sounds clear bilaterally. He denies any discomfort. Dr. Renae GlossWieting notified on rounds. He stated to continue to monitor and to notify him if things decline.

## 2017-04-16 ENCOUNTER — Encounter
Admission: RE | Admit: 2017-04-16 | Discharge: 2017-04-16 | Disposition: A | Discharge status: Home / Self Care | Payer: Medicare HMO | Source: Ambulatory Visit | Attending: Internal Medicine | Admitting: Internal Medicine

## 2017-04-16 DIAGNOSIS — R197 Diarrhea, unspecified: Secondary | ICD-10-CM | POA: Insufficient documentation

## 2017-04-16 DIAGNOSIS — E870 Hyperosmolality and hypernatremia: Secondary | ICD-10-CM | POA: Insufficient documentation

## 2017-04-16 LAB — BASIC METABOLIC PANEL
Anion gap: 11 (ref 5–15)
BUN: 70 mg/dL — AB (ref 6–20)
CHLORIDE: 115 mmol/L — AB (ref 101–111)
CO2: 25 mmol/L (ref 22–32)
Calcium: 8.5 mg/dL — ABNORMAL LOW (ref 8.9–10.3)
Creatinine, Ser: 1.52 mg/dL — ABNORMAL HIGH (ref 0.61–1.24)
GFR calc Af Amer: 46 mL/min — ABNORMAL LOW (ref >60)
GFR calc non Af Amer: 39 mL/min — ABNORMAL LOW (ref >60)
Glucose, Bld: 113 mg/dL — ABNORMAL HIGH (ref 65–99)
POTASSIUM: 5.1 mmol/L (ref 3.5–5.1)
SODIUM: 151 mmol/L — AB (ref 135–145)

## 2017-04-16 LAB — HEMOGLOBIN: Hemoglobin: 10 g/dL — ABNORMAL LOW (ref 13.0–18.0)

## 2017-04-16 MED ORDER — BISACODYL 10 MG RE SUPP
10.0000 mg | Freq: Once | RECTAL | Status: AC
  Administered 2017-04-16: 10 mg via RECTAL
  Filled 2017-04-16: qty 1

## 2017-04-16 MED ORDER — CEPHALEXIN 250 MG PO CAPS: 250.0000 mg | ORAL_CAPSULE | Freq: Two times a day (BID) | ORAL | 0 refills | Status: AC

## 2017-04-16 MED ORDER — CARVEDILOL 6.25 MG PO TABS: 6.2500 mg | ORAL_TABLET | Freq: Two times a day (BID) | ORAL | 0 refills | Status: DC

## 2017-04-16 MED ORDER — DEXTROSE 5 % IV BOLUS
250.0000 mL | Freq: Once | INTRAVENOUS | Status: AC
  Administered 2017-04-16: 11:00:00 250 mL via INTRAVENOUS

## 2017-04-16 MED ORDER — ENSURE ENLIVE PO LIQD: 237.0000 mL | Freq: Three times a day (TID) | ORAL | 0 refills | Status: DC

## 2017-04-16 MED ORDER — SACUBITRIL-VALSARTAN 49-51 MG PO TABS: 1.0000 | ORAL_TABLET | Freq: Two times a day (BID) | ORAL | 0 refills | Status: AC

## 2017-04-16 MED ORDER — PREDNISONE 5 MG PO TABS: ORAL_TABLET | ORAL | 0 refills | Status: DC

## 2017-04-16 MED ORDER — APIXABAN 2.5 MG PO TABS: 2.5000 mg | ORAL_TABLET | Freq: Two times a day (BID) | ORAL | 0 refills | Status: DC

## 2017-04-16 NOTE — Clinical Social Work Placement (Signed)
   CLINICAL SOCIAL WORK PLACEMENT  NOTE  Date:  04/16/2017  Patient Details  Name: Jordan PennaDouglas Tobler MRN: [id number] Date of Birth: Jun 07, 1927  Clinical Social Work is seeking post-discharge placement for this patient at the Skilled  Nursing Facility level of care (*CSW will initial, date and re-position this form in  chart as items are completed):  Yes   Patient/family provided with [facility] Clinical Social Work Department's list of facilities offering this level of care within the geographic area requested by the patient (or if unable, by the patient's family).  Yes   Patient/family informed of their freedom to choose among providers that offer the needed level of care, that participate in Medicare, Medicaid or managed care program needed by the patient, have an available bed and are willing to accept the patient.  Yes   Patient/family informed of [facility]'s ownership interest in [facility] and [facility], as well as of the fact that they are under no obligation to receive care at these facilities.  PASRR submitted to EDS on 04/11/17     PASRR number received on 04/11/17     Existing PASRR number confirmed on       FL2 transmitted to all facilities in geographic area requested by pt/family on 04/11/17     FL2 transmitted to all facilities within larger geographic area on       Patient informed that his/her managed care company has contracts with or will negotiate with certain facilities, including the following:        Yes   Patient/family informed of bed offers received.  Patient chooses bed at [facility] )     Physician recommends and patient chooses bed at      Patient to be transferred to [facility] ) on 04/16/17.  Patient to be transferred to facility by [facility]     Patient family notified on 04/16/17 of transfer.  Name of family member notified:  (Patient's wife [name] is aware of D/C today. )     PHYSICIAN       Additional  Comment:    _______________________________________________ Endy Easterly, Darleen CrockerBailey M, LCSW 04/16/2017, 11:56 AM

## 2017-04-16 NOTE — Progress Notes (Signed)
Physical Therapy Treatment Patient Details Name: Jordan PennaDouglas Novoa MRN: [id number] DOB: [birth year] Today's Date: 04/16/2017    History of Present Illness 84 y/o male here with sepsis, UTI.  84 y.o. male with a known history of atrial fibrillation on Xarelto, history of stroke with left hemiparesis, hypertension, chronic urinary retention with Foley catheter     PT Comments    Pt agreeable to PT; initially denies pain, but pain noted with bed mobility in R upper extremity/elbow (4/10) via faces scale. Pt participates in supine lower extremity exercises requiring verbal and physical assist for following instruction and technique; greater difficulty with Left lower extremity control, but demonstrates good movement with persistence. Heavy Mod A for supine to sit and Max A for sit to supine and repositioning upward in bed. Poor sitting balance requiring Min A; tolerates several minutes. Pt gives good effort throughout session. Continue PT to progress strength and endurance to improve all functional mobility.    Follow Up Recommendations  SNF     Equipment Recommendations       Recommendations for Other Services       Precautions / Restrictions Precautions Precautions: Fall Restrictions Weight Bearing Restrictions: No    Mobility  Bed Mobility Overal bed mobility: Needs Assistance Bed Mobility: Supine to Sit;Sit to Supine     Supine to sit: Mod assist Sit to supine: Max assist   General bed mobility comments: Greater difficulty  with use of RUE today due to pain; gives effort. Only sits a couple of minutes with Min A  Transfers                    Ambulation/Gait                 Stairs            Wheelchair Mobility    Modified Rankin (Stroke Patients Only)       Balance Overall balance assessment: Needs assistance Sitting-balance support: Feet supported;Single extremity supported Sitting balance-Leahy Scale: Poor                                      Cognition Arousal/Alertness: Awake/alert Behavior During Therapy: WFL for tasks assessed/performed Overall Cognitive Status: Within Functional Limits for tasks assessed                                        Exercises General Exercises - Lower Extremity Ankle Circles/Pumps: AROM;Right;20 reps;Supine(PROM L) Quad Sets: Strengthening;Both;10 reps;Supine Gluteal Sets: Strengthening;Both;10 reps;Supine(In bridge position) Short Arc Quad: AROM;Both;10 reps;Supine(A) Heel Slides: AROM;AAROM;Both;10 reps;Supine Hip ABduction/ADduction: AAROM;AROM;Both;10 reps;Supine Straight Leg Raises: AROM;AAROM;Both;10 reps;Supine    General Comments        Pertinent Vitals/Pain Pain Assessment: Faces Faces Pain Scale: Hurts little more Pain Location: R elbow    Home Living                      Prior Function            PT Goals (current goals can now be found in the care plan section) Progress towards PT goals: Progressing toward goals(slowly)    Frequency    Min 2X/week      PT Plan Current plan remains appropriate    Co-evaluation  AM-PAC PT "6 Clicks" Daily Activity  Outcome Measure  Difficulty turning over in bed (including adjusting bedclothes, sheets and blankets)?: Unable Difficulty moving from lying on back to sitting on the side of the bed? : Unable Difficulty sitting down on and standing up from a chair with arms (e.g., wheelchair, bedside commode, etc,.)?: Unable Help needed moving to and from a bed to chair (including a wheelchair)?: Total Help needed walking in hospital room?: Total Help needed climbing 3-5 steps with a railing? : Total 6 Click Score: 6    End of Session   Activity Tolerance: Patient limited by fatigue;Other (comment)(weakness/balance) Patient left: in bed;with call bell/phone within reach;with bed alarm set;with family/visitor present   PT Visit Diagnosis: Unsteadiness on feet  (R26.81);Muscle weakness (generalized) (M62.81);Pain;Hemiplegia and hemiparesis Hemiplegia - Right/Left: Left Hemiplegia - dominant/non-dominant: Non-dominant Hemiplegia - caused by: Cerebral infarction Pain - Right/Left: Left Pain - part of body: Leg     Time: 1132-1201 PT Time Calculation (min) (ACUTE ONLY): 29 min  Charges:  $Therapeutic Exercise: 8-22 mins $Therapeutic Activity: 8-22 mins                    G CodesScot Dock, PTA 04/16/2017, 1:46 PM

## 2017-04-16 NOTE — Discharge Summary (Signed)
[facility] [place] at [facility]   PATIENT NAME: Jordan Kline    MR#:  [id number]  DATE OF BIRTH:  [birth year]  DATE OF ADMISSION:  04/08/2017 ADMITTING PHYSICIAN: Enid Baas, MD  DATE OF DISCHARGE: 04/16/2017  PRIMARY CARE PHYSICIAN: Sherron Monday, MD    ADMISSION DIAGNOSIS:  Sepsis, due to unspecified organism (HCC) [A41.9] Urinary tract infection with hematuria, site unspecified [N39.0, R31.9] Altered mental status, unspecified altered mental status type [R41.82]  DISCHARGE DIAGNOSIS:  Active Problems:   Sepsis (HCC)   Goals of care, counseling/discussion   Palliative care encounter   SECONDARY DIAGNOSIS:   Past Medical History:  Diagnosis Date  . A-fib (HCC)    On Xarelto  . Chronic kidney disease (CKD)   . High cholesterol   . Hypertension   . Inguinal hernia   . Stroke [facility])    With left-sided hemiparesis  . Urinary retention    Chronic Foley catheter since December 2016    HOSPITAL COURSE:   1.  Clinical sepsis.  Patient was initially started on Rocephin and switched over to Keflex.  Blood cultures negative.  Urine analysis was positive but urine culture grew out contamination.  Foley changed.  Recommend changing Foley every 4 weeks. 2.  Symptomatic anemia.  Patient was transfused 2 units of packed red blood cells during the hospital course and also given IV iron.  Patient's hemoglobin upon discharge is 10.0.  Continue oral iron as outpatient. 3.  Right elbow bursitis.  I started low-dose prednisone and continue Keflex for 5 more days. 4.  Hypernatremia with poor appetite.  Recommend checking a BMP on Thursday.  I advised that the patient must eat and drink in order to stay hydrated.  We will give a D5 bolus prior to disposition.  Patient is on a dysphagia 3 diet with thin liquids.  Patient at high risk for dehydration. 5.  Acute kidney injury on chronic kidney disease.  Today's creatinine actually better at 1.52. 6.  Chronic  atrial fibrillation.  Family concerned about stroke risk and willing to start Eliquis low dose for anticoagulation.  Hemoglobin stable after starting Eliquis.  Rate controlled with Coreg and diltiazem. 7.  History of CVA with left-sided hemiplegia.  Eliquis for anticoagulation 8.  Weakness.  Physical therapy recommended rehab. 9.  History of CHF on Entresto.  Recommend checking a BMP on Thursday.  If potassium still borderline may have to get rid of Entresto. 10.  Patient is a DNR 11.  With blood pressure being on the lower side, had to cut back on the Coreg dose and the Entresto dose.  DISCHARGE CONDITIONS:   Fair   CONSULTS OBTAINED:  None  DRUG ALLERGIES:  No Known Allergies  DISCHARGE MEDICATIONS:   Allergies as of 04/16/2017   No Known Allergies     Medication List    STOP taking these medications   ciprofloxacin 500 MG tablet Commonly known as:  CIPRO   ENTRESTO 97-103 MG Generic drug:  sacubitril-valsartan Replaced by:  sacubitril-valsartan 49-51 MG   finasteride 5 MG tablet Commonly known as:  PROSCAR   lubiprostone 24 MCG capsule Commonly known as:  AMITIZA   oxybutynin 5 MG tablet Commonly known as:  DITROPAN   oxyCODONE-acetaminophen 5-325 MG tablet Commonly known as:  ROXICET   Rivaroxaban 15 MG Tabs tablet Commonly known as:  XARELTO   tamsulosin 0.4 MG Caps capsule Commonly known as:  FLOMAX   terazosin 2 MG capsule Commonly known as:  HYTRIN  TAKE these medications   apixaban 2.5 MG Tabs tablet Commonly known as:  ELIQUIS Take 1 tablet (2.5 mg total) by mouth 2 (two) times daily.   atorvastatin 80 MG tablet Commonly known as:  LIPITOR Take 80 mg by mouth every evening.   carvedilol 6.25 MG tablet Commonly known as:  COREG Take 1 tablet (6.25 mg total) by mouth 2 (two) times daily. What changed:    medication strength  how much to take   cephALEXin 250 MG capsule Commonly known as:  KEFLEX Take 1 capsule (250 mg total) by  mouth every 12 (twelve) hours for 5 days.   diltiazem 120 MG 24 hr capsule Commonly known as:  CARDIZEM CD Take 120 mg by mouth every evening.   docusate sodium 100 MG capsule Commonly known as:  COLACE Take 100 mg by mouth 2 (two) times daily.   feeding supplement (ENSURE ENLIVE) Liqd Take 237 mLs by mouth 3 (three) times daily between meals.   ferrous sulfate 325 (65 FE) MG tablet Take 1 tablet (325 mg total) by mouth 2 (two) times daily with a meal.   multivitamin with minerals Tabs tablet Take 1 tablet by mouth daily.   predniSONE 5 MG tablet Commonly known as:  DELTASONE 2 tabs po day1; 1 tab po day2,3 then stop   sacubitril-valsartan 49-51 MG Commonly known as:  ENTRESTO Take 1 tablet by mouth 2 (two) times daily. Replaces:  ENTRESTO 97-103 MG        DISCHARGE INSTRUCTIONS:   Follow-up at rehab 1 day  If you experience worsening of your admission symptoms, develop shortness of breath, life threatening emergency, suicidal or homicidal thoughts you must seek medical attention immediately by calling 911 or calling your MD immediately  if symptoms less severe.  You Must read complete instructions/literature along with all the possible adverse reactions/side effects for all the Medicines you take and that have been prescribed to you. Take any new Medicines after you have completely understood and accept all the possible adverse reactions/side effects.   Please note  You were cared for by a hospitalist during your hospital stay. If you have any questions about your discharge medications or the care you received while you were in the hospital after you are discharged, you can call the unit and asked to speak with the hospitalist on call if the hospitalist that took care of you is not available. Once you are discharged, your primary care physician will handle any further medical issues. Please note that NO REFILLS for any discharge medications will be authorized once you are  discharged, as it is imperative that you return to your primary care physician (or establish a relationship with a primary care physician if you do not have one) for your aftercare needs so that they can reassess your need for medications and monitor your lab values.    Today   CHIEF COMPLAINT:   Chief Complaint  Patient presents with  . Recurrent UTI    Suspected UTI    HISTORY OF PRESENT ILLNESS:  Jordan Kline  is a 84 y.o. male sent in with suspected urinary tract infection   VITAL SIGNS:  Blood pressure 123/77, pulse (!) 105, temperature 98 F (36.7 C), temperature source Oral, resp. rate 20, height 5\' 9"  (1.753 m), weight 51.3 kg (113 lb 3.2 oz), SpO2 96 %.   PHYSICAL EXAMINATION:  GENERAL:  84 y.o.-year-old patient lying in the bed with no acute distress.  EYES: Pupils equal, round, reactive to light  and accommodation. No scleral icterus. Extraocular muscles intact.  HEENT: Head atraumatic, normocephalic. Oropharynx and nasopharynx clear.  NECK:  Supple, no jugular venous distention. No thyroid enlargement, no tenderness.  LUNGS: Normal breath sounds bilaterally, no wheezing, rales,rhonchi or crepitation. No use of accessory muscles of respiration.  CARDIOVASCULAR: S1, S2 normal. No murmurs, rubs, or gallops.  ABDOMEN: Soft, non-tender, non-distended. Bowel sounds present. No organomegaly or mass.  EXTREMITIES: No pedal edema, cyanosis, or clubbing.  NEUROLOGIC: Left-sided weakness. Sensation intact. Gait not checked.  PSYCHIATRIC: The patient is alert and oriented x 3.  SKIN: No obvious rash, lesion, or ulcer.   DATA REVIEW:   CBC Recent Labs  Lab 04/15/17 0420 04/16/17 0401  WBC 16.6*  --   HGB 9.8* 10.0*  HCT 29.2*  --   PLT 293  --     Chemistries  Recent Labs  Lab 04/16/17 0401  NA 151*  K 5.1  CL 115*  CO2 25  GLUCOSE 113*  BUN 70*  CREATININE 1.52*  CALCIUM 8.5*     Microbiology Results  Results for orders placed or performed during  the hospital encounter of 04/08/17  Urine culture     Status: Abnormal   Collection Time: 04/08/17  5:24 PM  Result Value Ref Range Status   Specimen Description   Final    URINE, RANDOM Performed at [facility], [address], [place], [place] [address]    Special Requests   Final    NONE Performed at [facility], [address] [place], [place] [address]    Culture MULTIPLE SPECIES PRESENT, SUGGEST RECOLLECTION (A)  Final   Report Status 04/10/2017 FINAL  Final  Blood Culture (routine x 2)     Status: None   Collection Time: 04/08/17  6:11 PM  Result Value Ref Range Status   Specimen Description BLOOD BLOOD RIGHT WRIST  Final   Special Requests   Final    BOTTLES DRAWN AEROBIC AND ANAEROBIC Blood Culture results may not be optimal due to an inadequate volume of blood received in culture bottles   Culture   Final    NO GROWTH 5 DAYS Performed at [facility], [address], [place], [place] [address]    Report Status 04/13/2017 FINAL  Final  Blood Culture (routine x 2)     Status: None   Collection Time: 04/08/17  6:11 PM  Result Value Ref Range Status   Specimen Description BLOOD RIGHT ANTECUBITAL  Final   Special Requests   Final    BOTTLES DRAWN AEROBIC AND ANAEROBIC Blood Culture adequate volume   Culture   Final    NO GROWTH 5 DAYS Performed at [facility], [address] [place], [place] [address]    Report Status 04/13/2017 FINAL  Final    Management plans discussed with the patient, family and they are in agreement.  CODE STATUS:     Code Status Orders  (From admission, onward)        Start     Ordered   04/13/17 1344  Do not attempt resuscitation (DNR)  Continuous    Question Answer Comment  In the event of cardiac or respiratory ARREST Do not call a "code blue"   In the event of cardiac or respiratory ARREST Do not perform Intubation, CPR, defibrillation or ACLS   In the event of cardiac or respiratory  ARREST Use medication by any route, position, wound care, and other measures to relive pain and suffering. May  use oxygen, suction and manual treatment of airway obstruction as needed for comfort.   Comments nurse may pronounce      04/13/17 1343    Code Status History    Date Active Date Inactive Code Status Order ID Comments User Context   04/08/2017 21:16 04/13/2017 13:43 Full Code [id number]  Enid BaasKalisetti, Radhika, MD Inpatient   11/08/2015 15:56 11/11/2015 19:58 DNR [id number]  Adrian SaranMody, Sital, MD ED   03/01/2015 17:29 03/04/2015 19:07 DNR [id number]  Altamese DillingVachhani, Vaibhavkumar, MD Inpatient    Advance Directive Documentation     Most Recent Value  Type of Advance Directive  Healthcare Power of Attorney  Pre-existing out of facility DNR order (yellow form or pink MOST form)  No data  "MOST" Form in Place?  No data      TOTAL TIME TAKING CARE OF THIS PATIENT: 35 minutes.    Alford Highlandichard Zeffie Bickert M.D on 04/16/2017 at 10:37 AM  Between 7am to 6pm - Pager - [phone]  After 6pm go to [email or web address] - password [email or web address]  [facility] Office  [phone]  CC: Primary care physician; [name], S Ahmed, MD

## 2017-04-16 NOTE — Progress Notes (Signed)
[facility] [employer] [facility] authorization has been received and patient can come today to room 202-B. Patient is medically stable for D/[facility]. RN will call report at [phone] and arrange EMS for transport. Clinical Child psychotherapistocial Worker (CSW) sent D/C orders to [facility] via [employer]. CSW contacted patient's wife [name] and made her aware of above. Please reconsult if future social work needs arise. CSW signing off.   [employer]orporatedBailey Aravind Chrismer, LCSW [phone]

## 2017-04-19 ENCOUNTER — Other Ambulatory Visit
Admission: RE | Admit: 2017-04-19 | Discharge: 2017-04-19 | Disposition: A | Discharge status: Home / Self Care | Payer: Medicare HMO | Source: Ambulatory Visit | Attending: Internal Medicine | Admitting: Internal Medicine

## 2017-04-19 DIAGNOSIS — R197 Diarrhea, unspecified: Secondary | ICD-10-CM | POA: Diagnosis present

## 2017-04-19 DIAGNOSIS — E870 Hyperosmolality and hypernatremia: Secondary | ICD-10-CM | POA: Diagnosis present

## 2017-04-19 LAB — C DIFFICILE QUICK SCREEN W PCR REFLEX
C DIFFICILE (CDIFF) TOXIN: POSITIVE — AB
C Diff antigen: POSITIVE — AB
C Diff interpretation: DETECTED

## 2017-04-19 LAB — COMPREHENSIVE METABOLIC PANEL
ALK PHOS: 64 U/L (ref 38–126)
ALT: 136 U/L — AB (ref 17–63)
AST: 155 U/L — ABNORMAL HIGH (ref 15–41)
Albumin: 1.8 g/dL — ABNORMAL LOW (ref 3.5–5.0)
Anion gap: 7 (ref 5–15)
BILIRUBIN TOTAL: 0.6 mg/dL (ref 0.3–1.2)
BUN: 66 mg/dL — ABNORMAL HIGH (ref 6–20)
CALCIUM: 8 mg/dL — AB (ref 8.9–10.3)
CO2: 27 mmol/L (ref 22–32)
CREATININE: 1.58 mg/dL — AB (ref 0.61–1.24)
Chloride: 110 mmol/L (ref 101–111)
GFR, EST AFRICAN AMERICAN: 44 mL/min — AB (ref >60)
GFR, EST NON AFRICAN AMERICAN: 38 mL/min — AB (ref >60)
Glucose, Bld: 94 mg/dL (ref 65–99)
Potassium: 4.8 mmol/L (ref 3.5–5.1)
Sodium: 144 mmol/L (ref 135–145)
Total Protein: 5.4 g/dL — ABNORMAL LOW (ref 6.5–8.1)

## 2017-04-23 ENCOUNTER — Other Ambulatory Visit
Admission: RE | Admit: 2017-04-23 | Discharge: 2017-04-23 | Disposition: A | Discharge status: Home / Self Care | Payer: Medicare HMO | Source: Ambulatory Visit | Attending: Gerontology | Admitting: Gerontology

## 2017-04-23 DIAGNOSIS — N3000 Acute cystitis without hematuria: Secondary | ICD-10-CM | POA: Insufficient documentation

## 2017-04-23 LAB — URINALYSIS, COMPLETE (UACMP) WITH MICROSCOPIC
Bilirubin Urine: NEGATIVE
Glucose, UA: NEGATIVE mg/dL
Ketones, ur: NEGATIVE mg/dL
Nitrite: NEGATIVE
Protein, ur: 30 mg/dL — AB
SPECIFIC GRAVITY, URINE: 1.013 (ref 1.005–1.030)
Squamous Epithelial / LPF: NONE SEEN
pH: 8 (ref 5.0–8.0)

## 2017-04-24 LAB — URINE CULTURE: CULTURE: NO GROWTH

## 2017-04-26 ENCOUNTER — Encounter: Payer: Self-pay | Admitting: Gerontology

## 2017-04-26 ENCOUNTER — Non-Acute Institutional Stay (SKILLED_NURSING_FACILITY): Payer: Medicare HMO | Admitting: Gerontology

## 2017-04-26 DIAGNOSIS — R339 Retention of urine, unspecified: Secondary | ICD-10-CM

## 2017-04-26 DIAGNOSIS — L989 Disorder of the skin and subcutaneous tissue, unspecified: Secondary | ICD-10-CM | POA: Diagnosis not present

## 2017-04-26 DIAGNOSIS — A0472 Enterocolitis due to Clostridium difficile, not specified as recurrent: Secondary | ICD-10-CM | POA: Diagnosis not present

## 2017-04-26 DIAGNOSIS — E43 Unspecified severe protein-calorie malnutrition: Secondary | ICD-10-CM | POA: Diagnosis not present

## 2017-04-26 DIAGNOSIS — A419 Sepsis, unspecified organism: Secondary | ICD-10-CM

## 2017-04-26 NOTE — Progress Notes (Signed)
Location:   [facility] Room Number: 205A Place of Service:  SNF [phone]) Provider:  Lorenso Quarry, NP-C  Sherron Monday, MD  Patient Care Team: Sherron Monday, MD as PCP - General (Internal Medicine)  Extended Emergency Contact Information Primary Emergency Contact: Shifflet,[name] Address: [address]          [place] [address] [place] Home Phone: [phone] Mobile Phone: [phone] Relation: Spouse Secondary Emergency Contact: [place], [place] Mobile Phone: [phone] Relation: Son Preferred language: English Interpreter needed? No  Code Status:  DNR Goals of care: Advanced Directive information Advanced Directives 04/08/2017  Does Patient Have a Medical Advance Directive? Yes  Type of Advance Directive Healthcare Power of Attorney  Does patient want to make changes to medical advance directive? No - Patient declined  Copy of Healthcare Power of Attorney in Chart? No - copy requested  Would patient like information on creating a medical advance directive? No - Patient declined     Chief Complaint  Patient presents with  . Medical Management of Chronic Issues    Routine Visit    HPI:  Pt is a 84 y.o. male seen today for medical management of chronic diseases.    Sepsis, due to unspecified organism (HCC) Resolved.  Sepsis was due to UTI.  Antibiotics complete.  No further symptoms.  Clostridium difficile colitis Active.  Patient having multiple, loose, foul-smelling stools.  Stool sample obtained.  Positive for C. difficile toxin.  Treatment initiated.  Urinary retention Ongoing.  Continue indwelling Foley catheter.  Skin erosion Area of skin irritation/erosion on the penis.  Patient denies pain.  No bleeding or drainage.  No other lesions noted.  Protein-calorie malnutrition, severe Patient is cachectic with temporal wasting.  Albumin 1.8, total protein 5.4.  Patient weighs 120 pounds.  BMI 19.97.   10 pound weight loss since hospitalization.   Past Medical History:  Diagnosis Date  . A-fib (HCC)    On Xarelto  . Chronic kidney disease (CKD)   . High cholesterol   . Hypertension   . Inguinal hernia   . Stroke [facility])    With left-sided hemiparesis  . Urinary retention    Chronic Foley catheter since December 2016   Past Surgical History:  Procedure Laterality Date  . APPENDECTOMY    . PERIPHERAL VASCULAR CATHETERIZATION N/A 11/10/2015   Procedure: Visceral Angiography, mesenteric angio;  Surgeon: Annice Needy, MD;  Location: [facility] INVASIVE CV LAB;  Service: Cardiovascular;  Laterality: N/A;  . PERIPHERAL VASCULAR CATHETERIZATION N/A 11/10/2015   Procedure: Visceral Artery Intervention;  Surgeon: Annice Needy, MD;  Location: [facility] INVASIVE CV LAB;  Service: Cardiovascular;  Laterality: N/A;    No Known Allergies  Allergies as of 04/26/2017   No Known Allergies     Medication List        Accurate as of 04/26/17  2:34 PM. Always use your most recent med list.          acetaminophen 325 MG tablet Commonly known as:  TYLENOL Take 650 mg by mouth every 4 (four) hours as needed. Pain / increased temp.   apixaban 2.5 MG Tabs tablet Commonly known as:  ELIQUIS Take 1 tablet (2.5 mg total) by mouth 2 (two) times daily.   atorvastatin 80 MG tablet Commonly known as:  LIPITOR Take 80 mg by mouth every evening.   carvedilol 6.25 MG tablet Commonly known as:  COREG Take 1 tablet (6.25 mg total) by mouth 2 (two)  times daily.   diltiazem 120 MG 24 hr capsule Commonly known as:  CARDIZEM CD Take 120 mg by mouth every evening.   docusate sodium 100 MG capsule Commonly known as:  COLACE Take 100 mg by mouth 2 (two) times daily.   ENSURE ENLIVE PO Take 1 Bottle by mouth 3 (three) times daily. For 8 oz bottle, add 2 tablespoons of thickner. Stir for 15 seconds until dissolved.   feeding supplement (PRO-STAT SUGAR FREE 64) Liqd Take 30 mLs by mouth 2 (two) times daily between  meals.   ferrous sulfate 325 (65 FE) MG tablet Take 1 tablet (325 mg total) by mouth 2 (two) times daily with a meal.   food thickener Powd Commonly known as:  THICK IT Take by mouth. Use to mix thin liquids to nectar thick consistency as needed   multivitamin with minerals Tabs tablet Take 1 tablet by mouth daily.   neomycin-bacitracin-polymyxin [phone] ointment Apply 1 application topically 2 (two) times daily. Apply thin layer to open area. Keep clean.   RISA-BID PROBIOTIC Tabs Take 1 tablet by mouth 2 (two) times daily.   sacubitril-valsartan 49-51 MG Commonly known as:  ENTRESTO Take 1 tablet by mouth 2 (two) times daily.       Review of Systems  Constitutional: Positive for fatigue and unexpected weight change. Negative for activity change, appetite change, chills, diaphoresis and fever.  HENT: Negative for congestion, mouth sores, nosebleeds, postnasal drip, sneezing, sore throat, trouble swallowing and voice change.   Respiratory: Negative for apnea, cough, choking, chest tightness, shortness of breath and wheezing.   Cardiovascular: Negative for chest pain, palpitations and leg swelling.  Gastrointestinal: Positive for diarrhea. Negative for abdominal distention, abdominal pain, constipation and nausea.  Genitourinary: Positive for difficulty urinating (indwelling foley catheter) and genital sores. Negative for dysuria, frequency and urgency.  Musculoskeletal: Positive for gait problem. Negative for back pain and myalgias. Arthralgias: typical arthritis.  Skin: Negative for color change, pallor, rash and wound.  Neurological: Positive for weakness. Negative for dizziness, tremors, syncope, facial asymmetry, speech difficulty, light-headedness, numbness and headaches.  Psychiatric/Behavioral: Negative for agitation and behavioral problems.  All other systems reviewed and are negative.   Immunization History  Administered Date(s) Administered  . Pneumococcal  Polysaccharide-23 04/11/2017   Pertinent  Health Maintenance Due  Topic Date Due  . INFLUENZA VACCINE  10/25/2016  . PNA vac Low Risk Adult (2 of 2 - PCV13) 04/11/2018   No flowsheet data found. Functional Status Survey:    Vitals:   04/26/17 1352  BP: 137/70  Pulse: 70  Resp: 20  Temp: 98.4 F (36.9 C)  TempSrc: Oral  SpO2: 95%  Weight: 120 lb 9.6 oz (54.7 kg)  Height: 5\' 8"  (1.727 m)   Body mass index is 18.34 kg/m. Physical Exam  Constitutional: He is oriented to person, place, and time. Vital signs are normal. He appears cachectic. He is active and cooperative. He appears ill. No distress.  Temporal wasting  HENT:  Head: Normocephalic and atraumatic.  Mouth/Throat: Uvula is midline, oropharynx is clear and moist and mucous membranes are normal. Mucous membranes are not pale, not dry and not cyanotic.  Eyes: Conjunctivae, EOM and lids are normal. Pupils are equal, round, and reactive to light.  Neck: Trachea normal, normal range of motion and full passive range of motion without pain. Neck supple. No JVD present. No tracheal deviation, no edema and no erythema present. No thyromegaly present.  Cardiovascular: Normal rate, regular rhythm, normal heart sounds, intact  distal pulses and normal pulses. Exam reveals no gallop, no distant heart sounds and no friction rub.  No murmur heard. Pulses:      Dorsalis pedis pulses are 2+ on the right side, and 2+ on the left side.  No edema  Pulmonary/Chest: Effort normal and breath sounds normal. No accessory muscle usage. No respiratory distress. He has no decreased breath sounds. He has no wheezes. He has no rhonchi. He has no rales. He exhibits no tenderness.  Abdominal: Soft. Normal appearance and bowel sounds are normal. He exhibits no distension and no ascites. There is no tenderness.  Musculoskeletal: Normal range of motion. He exhibits no edema or tenderness.  Expected osteoarthritis, stiffness; Bilateral Calves soft, supple.  Negative Homan's Sign. B- pedal pulses equal; generalized weakness  Neurological: He is alert and oriented to person, place, and time. He has normal strength. He displays atrophy. He exhibits abnormal muscle tone. Coordination and gait abnormal.  Skin: Skin is warm, dry and intact. Lesion (penile skin erosion) noted. He is not diaphoretic. No cyanosis. No pallor. Nails show no clubbing.  Psychiatric: He has a normal mood and affect. His speech is normal and behavior is normal. Judgment and thought content normal. Cognition and memory are normal.  Nursing note and vitals reviewed.   Labs reviewed: Recent Labs    04/14/17 0433 04/16/17 0401 04/19/17 0600  NA 150* 151* 144  K 5.1 5.1 4.8  CL 117* 115* 110  CO2 25 25 27   GLUCOSE 124* 113* 94  BUN 56* 70* 66*  CREATININE 1.71* 1.52* 1.58*  CALCIUM 8.0* 8.5* 8.0*   Recent Labs    04/08/17 1652 04/19/17 0600  AST 38 155*  ALT 40 136*  ALKPHOS 70 64  BILITOT 0.7 0.6  PROT 6.3* 5.4*  ALBUMIN 2.2* 1.8*   Recent Labs    04/12/17 0335 04/13/17 0557 04/14/17 0433 04/15/17 0420 04/16/17 0401  WBC 13.5* 13.5*  --  16.6*  --   HGB 7.5* 9.3* 9.3* 9.8* 10.0*  HCT 22.0* 28.3*  --  29.2*  --   MCV 89.5 92.2  --  93.2  --   PLT 278 308  --  293  --    Lab Results  Component Value Date   TSH 3.458 04/08/2017   Lab Results  Component Value Date   HGBA1C  11/08/2015    UNABLE TO REPORT A1C DUE TO UNKNOWN INTERFERING FACTOR CAUSING THE ANALYTICAL RANGE TO BE OUTSIDE OF ANALYZER RANGE.  SAMPLE SENT TO [facility] FOR AN ALTERNATIVE METHOD.   No results found for: CHOL, HDL, LDLCALC, LDLDIRECT, TRIG, CHOLHDL  Significant Diagnostic Results in last 30 days:  Dg Chest Port 1 View  Result Date: 04/08/2017 CLINICAL DATA:  Altered mental status. EXAM: PORTABLE CHEST 1 VIEW COMPARISON:  09/25/2004 FINDINGS: The cardiac silhouette, mediastinal and hilar contours are within normal limits and stable. Mild tortuosity of the thoracic aorta. The  lungs are clear. No pleural effusion. The bony thorax is intact. Prominent skin fold noted over the left chest but no pneumothorax. IMPRESSION: No acute cardiopulmonary findings. Electronically Signed   By: Rudie MeyerP.  Gallerani M.D.   On: 04/08/2017 18:04    Assessment/Plan  Sepsis, due to unspecified organism (HCC)  Clostridium difficile colitis  Urinary retention  Skin erosion  Protein-calorie malnutrition, severe   Continue current medication regimen  Vancomycin 125 mg p.o. 4 times daily times 10 days  Risa-Bid 250 mg p.o. twice daily  Enteric/contact precautions.  Patient is to remain on precautions  until stool culture negative for C. difficile  Continue derma cloud to any areas of skin irritation as needed  Bactroban 2% cream- thin film to open area on penis twice daily until healed  Maintain skin integrity/keep skin in the perineal area clean and dry  Prevelon on boots to bilateral feet at all times when in bed, elevate heels off bed  Turn and reposition patient frequently as he is at high risk for skin breakdown  Continue indwelling Foley catheter  Catheter care per protocol  Ensure Foley tubing is secured to the leg to prevent further skin erosion  Continue multivitamin with minerals p.o. daily for supplement  Ensure Enlive-1 bottle p.o. Three times daily  Continue pro-stat 30 mL p.o. twice daily  Use Thick-It with liquids to thicken to nectar consistency  Follow-up with urology as instructed  Palliative care consult  Family/ staff Communication:   Total Time:  Documentation:  Face to Face:  Family/Phone:   Labs/tests ordered: Previous labs reviewed  Medication list reviewed and assessed for continued appropriateness. Monthly medication orders reviewed and signed.  Brynda Rim, NP-C Geriatrics [facility] [phone], [place] [address] Cell Phone (Mon-Fri 8am-5pm):  [phone] On Call:   [phone] & follow prompts after 5pm & weekends Office Phone:  [phone] Office Fax:  [phone]

## 2017-04-27 ENCOUNTER — Encounter
Admission: RE | Admit: 2017-04-27 | Discharge: 2017-04-27 | Disposition: A | Discharge status: Home / Self Care | Payer: Medicare HMO | Source: Ambulatory Visit | Attending: Internal Medicine | Admitting: Internal Medicine

## 2017-05-01 ENCOUNTER — Other Ambulatory Visit
Admission: RE | Admit: 2017-05-01 | Discharge: 2017-05-01 | Disposition: A | Discharge status: Home / Self Care | Payer: Medicare HMO | Source: Ambulatory Visit | Attending: Gerontology | Admitting: Gerontology

## 2017-05-01 DIAGNOSIS — R197 Diarrhea, unspecified: Secondary | ICD-10-CM | POA: Diagnosis present

## 2017-05-01 DIAGNOSIS — E870 Hyperosmolality and hypernatremia: Secondary | ICD-10-CM | POA: Insufficient documentation

## 2017-05-01 LAB — C DIFFICILE QUICK SCREEN W PCR REFLEX
C DIFFICILE (CDIFF) INTERP: NOT DETECTED
C Diff antigen: NEGATIVE
C Diff toxin: NEGATIVE

## 2017-05-02 ENCOUNTER — Encounter: Payer: Self-pay | Admitting: Gerontology

## 2017-05-02 ENCOUNTER — Non-Acute Institutional Stay (SKILLED_NURSING_FACILITY): Payer: Medicare HMO | Admitting: Gerontology

## 2017-05-02 DIAGNOSIS — A0472 Enterocolitis due to Clostridium difficile, not specified as recurrent: Secondary | ICD-10-CM

## 2017-05-02 DIAGNOSIS — R339 Retention of urine, unspecified: Secondary | ICD-10-CM | POA: Diagnosis not present

## 2017-05-02 DIAGNOSIS — A419 Sepsis, unspecified organism: Secondary | ICD-10-CM | POA: Diagnosis not present

## 2017-05-02 DIAGNOSIS — L989 Disorder of the skin and subcutaneous tissue, unspecified: Secondary | ICD-10-CM | POA: Diagnosis not present

## 2017-05-02 DIAGNOSIS — E43 Unspecified severe protein-calorie malnutrition: Secondary | ICD-10-CM

## 2017-05-02 NOTE — Progress Notes (Signed)
Location:   [facility] Room Number: 205A Place of Service:  SNF [phone]) Provider:  Lorenso Quarry, NP-C  Sherron Monday, MD  Patient Care Team: Sherron Monday, MD as PCP - General (Internal Medicine)  Extended Emergency Contact Information Primary Emergency Contact: Prichard,[name] Address: [address]          [place] [address] [place] Home Phone: [phone] Mobile Phone: [phone] Relation: Spouse Secondary Emergency Contact: Maximo, [place] Mobile Phone: [phone] Relation: Son Preferred language: English Interpreter needed? No  Code Status:  DNR Goals of care: Advanced Directive information Advanced Directives 05/02/2017  Does Patient Have a Medical Advance Directive? Yes  Type of Advance Directive Out of facility DNR (pink MOST or yellow form)  Does patient want to make changes to medical advance directive? No - Patient declined  Copy of Healthcare Power of Attorney in Chart? -  Would patient like information on creating a medical advance directive? -     Chief Complaint  Patient presents with  . Medical Management of Chronic Issues    Routine Visit    HPI:  Pt is a 84 y.o. male seen today for medical management of chronic diseases.  Patient was admitted to the facility for rehab following hospitalization at [facility] for sepsis due to a UTI.  Sepsis is now resolved.  Patient is attempting to participate in physical therapy and Occupational Therapy.  Patient is having minimal progress due to weakness and deconditioning.  Patient also has comorbidity of severe protein calorie malnutrition.  Patient is receiving supplements for this.  Patient was diagnosed with C. difficile.  He recently completed treatment.  Recheck of stool culture is negative for C. difficile.  Penile skin erosion is improving.  Indwelling Foley catheter still in place.  Draining clear, yellow urine.  Overall, patient looks and feels better-per  patient report.  Patient denies pain.  Patient denies nausea, vomiting, diarrhea, fever, chills, chest pain, shortness of breath, headache, abdominal pain, dizziness, cough, congestion.  Patient continues to be followed by palliative care.  Vital signs stable.  No other complaints.     Past Medical History:  Diagnosis Date  . A-fib (HCC)    On Xarelto  . Chronic kidney disease (CKD)   . High cholesterol   . Hypertension   . Inguinal hernia   . Stroke [facility])    With left-sided hemiparesis  . Urinary retention    Chronic Foley catheter since December 2016   Past Surgical History:  Procedure Laterality Date  . APPENDECTOMY    . PERIPHERAL VASCULAR CATHETERIZATION N/A 11/10/2015   Procedure: Visceral Angiography, mesenteric angio;  Surgeon: Annice Needy, MD;  Location: [facility] INVASIVE CV LAB;  Service: Cardiovascular;  Laterality: N/A;  . PERIPHERAL VASCULAR CATHETERIZATION N/A 11/10/2015   Procedure: Visceral Artery Intervention;  Surgeon: Annice Needy, MD;  Location: [facility] INVASIVE CV LAB;  Service: Cardiovascular;  Laterality: N/A;    No Known Allergies  Allergies as of 05/02/2017   No Known Allergies     Medication List        Accurate as of 05/02/17  2:25 PM. Always use your most recent med list.          acetaminophen 325 MG tablet Commonly known as:  TYLENOL Take 650 mg by mouth every 4 (four) hours as needed. Pain / increased temp.   apixaban 2.5 MG Tabs tablet Commonly known as:  ELIQUIS Take 1 tablet (2.5 mg total) by mouth 2 (  two) times daily.   atorvastatin 80 MG tablet Commonly known as:  LIPITOR Take 80 mg by mouth every evening.   carvedilol 6.25 MG tablet Commonly known as:  COREG Take 1 tablet (6.25 mg total) by mouth 2 (two) times daily.   diltiazem 120 MG 24 hr capsule Commonly known as:  CARDIZEM CD Take 120 mg by mouth every evening.   docusate sodium 100 MG capsule Commonly known as:  COLACE Take 100 mg by mouth 2 (two) times daily.   ENSURE  ENLIVE PO Take 1 Bottle by mouth 3 (three) times daily. For 8 oz bottle, add 2 tablespoons of thickner. Stir for 15 seconds until dissolved.   feeding supplement (PRO-STAT SUGAR FREE 64) Liqd Take 30 mLs by mouth 2 (two) times daily between meals.   ferrous sulfate 325 (65 FE) MG tablet Take 1 tablet (325 mg total) by mouth 2 (two) times daily with a meal.   food thickener Powd Commonly known as:  THICK IT Take by mouth. Use to mix thin liquids to nectar thick consistency as needed   multivitamin with minerals Tabs tablet Take 1 tablet by mouth daily.   mupirocin cream 2 % Commonly known as:  BACTROBAN Apply thin film topically to open areas on penis 2 times a day until healed   RISA-BID PROBIOTIC Tabs Take 1 tablet by mouth 2 (two) times daily.   sacubitril-valsartan 49-51 MG Commonly known as:  ENTRESTO Take 1 tablet by mouth 2 (two) times daily.       Review of Systems  Constitutional: Positive for unexpected weight change. Negative for activity change, appetite change, chills, diaphoresis and fever.  HENT: Negative for congestion, mouth sores, nosebleeds, postnasal drip, sneezing, sore throat, trouble swallowing and voice change.   Respiratory: Negative for apnea, cough, choking, chest tightness, shortness of breath and wheezing.   Cardiovascular: Negative for chest pain, palpitations and leg swelling.  Gastrointestinal: Positive for diarrhea (improved). Negative for abdominal distention, abdominal pain, constipation and nausea.  Genitourinary: Positive for difficulty urinating (indwelling foley catheter) and genital sores (improving). Negative for dysuria, frequency and urgency.  Musculoskeletal: Negative for back pain, gait problem and myalgias. Arthralgias: typical arthritis.  Skin: Negative for color change, pallor, rash and wound.  Neurological: Positive for weakness. Negative for dizziness, tremors, syncope, speech difficulty, numbness and headaches.    Psychiatric/Behavioral: Negative for agitation and behavioral problems.  All other systems reviewed and are negative.   Immunization History  Administered Date(s) Administered  . Pneumococcal Polysaccharide-23 04/11/2017   Pertinent  Health Maintenance Due  Topic Date Due  . INFLUENZA VACCINE  10/25/2016  . PNA vac Low Risk Adult (2 of 2 - PCV13) 04/11/2018   No flowsheet data found. Functional Status Survey:    Vitals:   05/02/17 1417  BP: (!) 154/70  Pulse: 96  Resp: 18  Temp: 98.4 F (36.9 C)  TempSrc: Oral  SpO2: 96%  Weight: 119 lb 12.8 oz (54.3 kg)  Height: 5\' 8"  (1.727 m)   Body mass index is 18.22 kg/m. Physical Exam  Constitutional: He is oriented to person, place, and time. Vital signs are normal. He appears well-developed. He appears cachectic. He is active and cooperative. He does not appear ill. No distress.  Temporal wasting  HENT:  Head: Normocephalic and atraumatic.  Mouth/Throat: Uvula is midline, oropharynx is clear and moist and mucous membranes are normal. Mucous membranes are not pale, not dry and not cyanotic.  Eyes: Conjunctivae, EOM and lids are normal. Pupils are  equal, round, and reactive to light.  Neck: Trachea normal, normal range of motion and full passive range of motion without pain. Neck supple. No JVD present. No tracheal deviation, no edema and no erythema present. No thyromegaly present.  Cardiovascular: Normal rate, regular rhythm, normal heart sounds, intact distal pulses and normal pulses. Exam reveals no gallop, no distant heart sounds and no friction rub.  No murmur heard. Pulses:      Dorsalis pedis pulses are 2+ on the right side, and 2+ on the left side.  No edema  Pulmonary/Chest: Effort normal and breath sounds normal. No accessory muscle usage. No respiratory distress. He has no decreased breath sounds. He has no wheezes. He has no rhonchi. He has no rales. He exhibits no tenderness.  Abdominal: Soft. Normal appearance and  bowel sounds are normal. He exhibits no distension and no ascites. There is no tenderness.  Musculoskeletal: Normal range of motion. He exhibits no edema or tenderness.  Expected osteoarthritis, stiffness; Bilateral Calves soft, supple. Negative Homan's Sign. B- pedal pulses equal; generalized weakness and bedbound  Neurological: He is alert and oriented to person, place, and time. He has normal strength. He displays atrophy. He exhibits abnormal muscle tone. Coordination and gait abnormal.  Skin: Skin is warm, dry and intact. Lesion (improving skin erosion) noted. He is not diaphoretic. No cyanosis. No pallor. Nails show no clubbing.  Psychiatric: He has a normal mood and affect. His speech is normal and behavior is normal. Judgment and thought content normal. Cognition and memory are normal.  Nursing note and vitals reviewed.   Labs reviewed: Recent Labs    04/14/17 0433 04/16/17 0401 04/19/17 0600  NA 150* 151* 144  K 5.1 5.1 4.8  CL 117* 115* 110  CO2 25 25 27   GLUCOSE 124* 113* 94  BUN 56* 70* 66*  CREATININE 1.71* 1.52* 1.58*  CALCIUM 8.0* 8.5* 8.0*   Recent Labs    04/08/17 1652 04/19/17 0600  AST 38 155*  ALT 40 136*  ALKPHOS 70 64  BILITOT 0.7 0.6  PROT 6.3* 5.4*  ALBUMIN 2.2* 1.8*   Recent Labs    04/12/17 0335 04/13/17 0557 04/14/17 0433 04/15/17 0420 04/16/17 0401  WBC 13.5* 13.5*  --  16.6*  --   HGB 7.5* 9.3* 9.3* 9.8* 10.0*  HCT 22.0* 28.3*  --  29.2*  --   MCV 89.5 92.2  --  93.2  --   PLT 278 308  --  293  --    Lab Results  Component Value Date   TSH 3.458 04/08/2017   Lab Results  Component Value Date   HGBA1C  11/08/2015    UNABLE TO REPORT A1C DUE TO UNKNOWN INTERFERING FACTOR CAUSING THE ANALYTICAL RANGE TO BE OUTSIDE OF ANALYZER RANGE.  SAMPLE SENT TO [facility] FOR AN ALTERNATIVE METHOD.   No results found for: CHOL, HDL, LDLCALC, LDLDIRECT, TRIG, CHOLHDL  Significant Diagnostic Results in last 30 days:  Dg Chest Port 1 View  Result  Date: 04/08/2017 CLINICAL DATA:  Altered mental status. EXAM: PORTABLE CHEST 1 VIEW COMPARISON:  09/25/2004 FINDINGS: The cardiac silhouette, mediastinal and hilar contours are within normal limits and stable. Mild tortuosity of the thoracic aorta. The lungs are clear. No pleural effusion. The bony thorax is intact. Prominent skin fold noted over the left chest but no pneumothorax. IMPRESSION: No acute cardiopulmonary findings. Electronically Signed   By: Rudie MeyerP.  Gallerani M.D.   On: 04/08/2017 18:04    Assessment/Plan  Sepsis, due to  unspecified organism [facility])  Resolved  Clostridium difficile colitis  Resolved.    Okay to DC enteric precautions once the room has been thoroughly cleaned with bleach.    Continue to monitor stools for diarrhea  Urinary retention  Ongoing  Continue Foley catheter  Continue Foley and perineal care per protocol  Skin erosion  Improving  Continue Bactroban cream until completely resolved  Continue derma cloud cream as needed to perineal area  Protein-calorie malnutrition, severe  Ongoing  Continue pro-stat 30 mL p.o. twice daily  Continue Ensure Enlive p.o. 3 times daily  Continue multivitamin  Continue ongoing discussions with palliative care  Family/ staff Communication:   Total Time:  Documentation:  Face to Face:  Family/Phone:   Labs/tests ordered:    Medication list reviewed and assessed for continued appropriateness. Monthly medication orders reviewed and signed.  Brynda Rim, NP-C Geriatrics [facility] [phone] [address]. [address], [place] [address] Cell Phone (Mon-Fri 8am-5pm):  [phone] On Call:  [phone] & follow prompts after 5pm & weekends Office Phone:  [phone] Office Fax:  [phone]

## 2017-05-04 ENCOUNTER — Non-Acute Institutional Stay (SKILLED_NURSING_FACILITY): Payer: Medicare HMO | Admitting: Gerontology

## 2017-05-04 ENCOUNTER — Encounter: Payer: Self-pay | Admitting: Gerontology

## 2017-05-04 DIAGNOSIS — M25461 Effusion, right knee: Secondary | ICD-10-CM | POA: Diagnosis not present

## 2017-05-04 NOTE — Progress Notes (Signed)
Location:   [facility] Room Number: 205A Place of Service:  SNF [phone]) Provider:  Lorenso Quarry, NP-C  Sherron Monday, MD  Patient Care Team: Sherron Monday, MD as PCP - General (Internal Medicine)  Extended Emergency Contact Information Primary Emergency Contact: Rotan,[name] Address: [address]          [place] [address] [place] Home Phone: [phone] Mobile Phone: [phone] Relation: Spouse Secondary Emergency Contact: Demarkis, Gheen Mobile Phone: [phone] Relation: Son Preferred language: English Interpreter needed? No  Code Status:  DNR Goals of care: Advanced Directive information Advanced Directives 05/04/2017  Does Patient Have a Medical Advance Directive? Yes  Type of Advance Directive Out of facility DNR (pink MOST or yellow form)  Does patient want to make changes to medical advance directive? No - Patient declined  Copy of Healthcare Power of Attorney in Chart? -  Would patient like information on creating a medical advance directive? -     Chief Complaint  Patient presents with  . Acute Visit    Recheck knee swelling    HPI:  Pt is a 84 y.o. male seen today for an acute visit for right knee swelling.  Patient reports he woke up this morning and had large, bulbous edema over the patellar region.  Patient reports if his leg is out straight, the knee is not painful.  The skin is not painful to palpation or skin manipulation.  However, the knee becomes exquisitely tender with knee flexion.  The skin is mildly red, slightly warm to touch.  The edema is fluctuant.  No joint crepitus felt.  No joint laxity elicited.  Patient denies trauma, including torsion/twisting of the knee.  Denies hitting the knee on bed rail or bedside table, etc.  No visible break in skin integrity.  Patient is afebrile.  Patient and wife refuse going to the ED for evaluation.  They are agreeable to in-house x-rays.   Otherwise, patient reports he is feeling well.  Vital signs stable.  No other complaints.    Past Medical History:  Diagnosis Date  . A-fib (HCC)    On Xarelto  . Chronic kidney disease (CKD)   . High cholesterol   . Hypertension   . Inguinal hernia   . Stroke [facility])    With left-sided hemiparesis  . Urinary retention    Chronic Foley catheter since December 2016   Past Surgical History:  Procedure Laterality Date  . APPENDECTOMY    . PERIPHERAL VASCULAR CATHETERIZATION N/A 11/10/2015   Procedure: Visceral Angiography, mesenteric angio;  Surgeon: Annice Needy, MD;  Location: [facility] INVASIVE CV LAB;  Service: Cardiovascular;  Laterality: N/A;  . PERIPHERAL VASCULAR CATHETERIZATION N/A 11/10/2015   Procedure: Visceral Artery Intervention;  Surgeon: Annice Needy, MD;  Location: [facility] INVASIVE CV LAB;  Service: Cardiovascular;  Laterality: N/A;    No Known Allergies  Allergies as of 05/04/2017   No Known Allergies     Medication List        Accurate as of 05/04/17  3:40 PM. Always use your most recent med list.          acetaminophen 325 MG tablet Commonly known as:  TYLENOL Take 650 mg by mouth every 4 (four) hours as needed. Pain / increased temp.   apixaban 2.5 MG Tabs tablet Commonly known as:  ELIQUIS Take 1 tablet (2.5 mg total) by mouth 2 (two) times daily.   atorvastatin 80 MG tablet Commonly known  as:  LIPITOR Take 80 mg by mouth every evening.   carvedilol 6.25 MG tablet Commonly known as:  COREG Take 1 tablet (6.25 mg total) by mouth 2 (two) times daily.   diltiazem 120 MG 24 hr capsule Commonly known as:  CARDIZEM CD Take 120 mg by mouth every evening.   docusate sodium 100 MG capsule Commonly known as:  COLACE Take 100 mg by mouth 2 (two) times daily.   ENSURE ENLIVE PO Take 1 Bottle by mouth 3 (three) times daily. For 8 oz bottle, add 2 tablespoons of thickner. Stir for 15 seconds until dissolved.   feeding supplement (PRO-STAT SUGAR FREE 64)  Liqd Take 30 mLs by mouth 2 (two) times daily between meals.   ferrous sulfate 325 (65 FE) MG tablet Take 1 tablet (325 mg total) by mouth 2 (two) times daily with a meal.   food thickener Powd Commonly known as:  THICK IT Take by mouth. Use to mix thin liquids to nectar thick consistency as needed   multivitamin with minerals Tabs tablet Take 1 tablet by mouth daily.   mupirocin cream 2 % Commonly known as:  BACTROBAN Apply thin film topically to open areas on penis 2 times a day until healed   RISA-BID PROBIOTIC Tabs Take 1 tablet by mouth 2 (two) times daily.   sacubitril-valsartan 49-51 MG Commonly known as:  ENTRESTO Take 1 tablet by mouth 2 (two) times daily.       Review of Systems  Constitutional: Negative for activity change, appetite change, chills, diaphoresis and fever.  HENT: Negative for congestion, mouth sores, nosebleeds, postnasal drip, sneezing, sore throat, trouble swallowing and voice change.   Respiratory: Negative for apnea, cough, choking, chest tightness, shortness of breath and wheezing.   Cardiovascular: Negative for chest pain, palpitations and leg swelling.  Gastrointestinal: Negative for abdominal distention, abdominal pain, constipation, diarrhea and nausea.  Genitourinary: Positive for difficulty urinating (indwelling foley). Negative for dysuria, frequency and urgency.  Musculoskeletal: Positive for arthralgias (typical arthritis), gait problem and joint swelling. Negative for back pain and myalgias.  Skin: Positive for color change. Negative for pallor, rash and wound.  Neurological: Positive for weakness. Negative for dizziness, tremors, syncope, speech difficulty, numbness and headaches.  Psychiatric/Behavioral: Negative for agitation and behavioral problems.  All other systems reviewed and are negative.   Immunization History  Administered Date(s) Administered  . Pneumococcal Polysaccharide-23 04/11/2017   Pertinent  Health Maintenance  Due  Topic Date Due  . INFLUENZA VACCINE  10/25/2016  . PNA vac Low Risk Adult (2 of 2 - PCV13) 04/11/2018   No flowsheet data found. Functional Status Survey:    Vitals:   05/04/17 1536  BP: 113/63  Pulse: 86  Resp: 20  Temp: 98.7 F (37.1 C)  TempSrc: Oral  SpO2: 97%  Weight: 120 lb 1.6 oz (54.5 kg)  Height: 5\' 8"  (1.727 m)   Body mass index is 18.26 kg/m. Physical Exam  Constitutional: He is oriented to person, place, and time. Vital signs are normal. He appears well-nourished. He appears cachectic. He is active and cooperative. He does not appear ill. No distress.  Temporal wasting  HENT:  Head: Normocephalic and atraumatic.  Mouth/Throat: Uvula is midline, oropharynx is clear and moist and mucous membranes are normal. Mucous membranes are not pale, not dry and not cyanotic.  Eyes: Conjunctivae, EOM and lids are normal. Pupils are equal, round, and reactive to light.  Neck: Trachea normal, normal range of motion and full passive range of  motion without pain. Neck supple. No JVD present. No tracheal deviation, no edema and no erythema present. No thyromegaly present.  Cardiovascular: Normal rate, regular rhythm, normal heart sounds, intact distal pulses and normal pulses. Exam reveals no gallop, no distant heart sounds and no friction rub.  No murmur heard. Pulses:      Dorsalis pedis pulses are 2+ on the right side, and 2+ on the left side.  No edema  Pulmonary/Chest: Effort normal and breath sounds normal. No accessory muscle usage. No respiratory distress. He has no decreased breath sounds. He has no wheezes. He has no rhonchi. He has no rales. He exhibits no tenderness.  Abdominal: Soft. Normal appearance and bowel sounds are normal. He exhibits no distension and no ascites. There is no tenderness.  Musculoskeletal: Normal range of motion. He exhibits no edema or tenderness.       Right knee: He exhibits swelling, effusion and erythema. He exhibits no deformity, no  laceration, normal alignment, no LCL laxity, normal patellar mobility, no bony tenderness and no MCL laxity.  Expected osteoarthritis, stiffness; Bilateral Calves soft, supple. Negative Homan's Sign. B- pedal pulses equal; generalized weakness and bedbound  Neurological: He is alert and oriented to person, place, and time. He has normal strength. He displays atrophy. He exhibits abnormal muscle tone. Coordination and gait abnormal.  Skin: Skin is warm, dry and intact. He is not diaphoretic. There is erythema (right knee). No cyanosis. No pallor. Nails show no clubbing.     Psychiatric: He has a normal mood and affect. His speech is normal and behavior is normal. Judgment and thought content normal. Cognition and memory are normal.  Nursing note and vitals reviewed.   Labs reviewed: Recent Labs    04/14/17 0433 04/16/17 0401 04/19/17 0600  NA 150* 151* 144  K 5.1 5.1 4.8  CL 117* 115* 110  CO2 25 25 27   GLUCOSE 124* 113* 94  BUN 56* 70* 66*  CREATININE 1.71* 1.52* 1.58*  CALCIUM 8.0* 8.5* 8.0*   Recent Labs    04/08/17 1652 04/19/17 0600  AST 38 155*  ALT 40 136*  ALKPHOS 70 64  BILITOT 0.7 0.6  PROT 6.3* 5.4*  ALBUMIN 2.2* 1.8*   Recent Labs    04/12/17 0335 04/13/17 0557 04/14/17 0433 04/15/17 0420 04/16/17 0401  WBC 13.5* 13.5*  --  16.6*  --   HGB 7.5* 9.3* 9.3* 9.8* 10.0*  HCT 22.0* 28.3*  --  29.2*  --   MCV 89.5 92.2  --  93.2  --   PLT 278 308  --  293  --    Lab Results  Component Value Date   TSH 3.458 04/08/2017   Lab Results  Component Value Date   HGBA1C  11/08/2015    UNABLE TO REPORT A1C DUE TO UNKNOWN INTERFERING FACTOR CAUSING THE ANALYTICAL RANGE TO BE OUTSIDE OF ANALYZER RANGE.  SAMPLE SENT TO [facility] FOR AN ALTERNATIVE METHOD.   No results found for: CHOL, HDL, LDLCALC, LDLDIRECT, TRIG, CHOLHDL  Significant Diagnostic Results in last 30 days:  Dg Chest Port 1 View  Result Date: 04/08/2017 CLINICAL DATA:  Altered mental status. EXAM:  PORTABLE CHEST 1 VIEW COMPARISON:  09/25/2004 FINDINGS: The cardiac silhouette, mediastinal and hilar contours are within normal limits and stable. Mild tortuosity of the thoracic aorta. The lungs are clear. No pleural effusion. The bony thorax is intact. Prominent skin fold noted over the left chest but no pneumothorax. IMPRESSION: No acute cardiopulmonary findings. Electronically Signed  By: Rudie Meyer M.D.   On: 04/08/2017 18:04    Assessment/Plan Knee effusion, right  Complete view x-rays of the right knee  Apply ice pack to the right knee 4 times daily and as needed  Okay to alternate heat and cold to the right knee  Apply Ace wrap or TED hose to give mild compression to the right knee.  Remove at night  Elevate leg  Please arrange appointment with orthopedist of choice next week for evaluation of knee effusion  Family/ staff Communication:   Total Time:  Documentation:  Face to Face:  Family/Phone:   Labs/tests ordered: Complete view x-rays of right knee  Medication list reviewed and assessed for continued appropriateness.  Brynda Rim, NP-C Geriatrics [facility] [phone] [address], [place] [address] Cell Phone (Mon-Fri 8am-5pm):  [phone] On Call:  [phone] & follow prompts after 5pm & weekends Office Phone:  [phone] Office Fax:  [phone]

## 2017-05-06 ENCOUNTER — Other Ambulatory Visit
Admission: RE | Admit: 2017-05-06 | Discharge: 2017-05-06 | Disposition: A | Discharge status: Home / Self Care | Payer: Medicare HMO | Source: Other Acute Inpatient Hospital | Attending: Internal Medicine | Admitting: Internal Medicine

## 2017-05-06 DIAGNOSIS — R198 Other specified symptoms and signs involving the digestive system and abdomen: Secondary | ICD-10-CM | POA: Diagnosis not present

## 2017-05-06 DIAGNOSIS — R194 Change in bowel habit: Secondary | ICD-10-CM | POA: Diagnosis present

## 2017-05-06 LAB — C DIFFICILE QUICK SCREEN W PCR REFLEX
C DIFFICILE (CDIFF) INTERP: DETECTED
C Diff antigen: POSITIVE — AB
C Diff toxin: POSITIVE — AB

## 2017-05-11 ENCOUNTER — Non-Acute Institutional Stay (SKILLED_NURSING_FACILITY): Payer: Medicare HMO | Admitting: Gerontology

## 2017-05-11 ENCOUNTER — Encounter: Payer: Self-pay | Admitting: Gerontology

## 2017-05-11 DIAGNOSIS — A419 Sepsis, unspecified organism: Secondary | ICD-10-CM | POA: Diagnosis not present

## 2017-05-11 DIAGNOSIS — R339 Retention of urine, unspecified: Secondary | ICD-10-CM

## 2017-05-11 DIAGNOSIS — L989 Disorder of the skin and subcutaneous tissue, unspecified: Secondary | ICD-10-CM

## 2017-05-11 DIAGNOSIS — E43 Unspecified severe protein-calorie malnutrition: Secondary | ICD-10-CM | POA: Diagnosis not present

## 2017-05-11 DIAGNOSIS — M25461 Effusion, right knee: Secondary | ICD-10-CM | POA: Diagnosis not present

## 2017-05-11 DIAGNOSIS — A0472 Enterocolitis due to Clostridium difficile, not specified as recurrent: Secondary | ICD-10-CM

## 2017-05-11 NOTE — Progress Notes (Signed)
Location:   [facility] Room Number: 205A Place of Service:  SNF [id number])  Provider: Lorenso Quarry, NP-C  PCP: Sherron Monday, MD Patient Care Team: Sherron Monday, MD as PCP - General (Internal Medicine)  Extended Emergency Contact Information Primary Emergency Contact: Venturini,[name] Address: [address]          [place], [place] [address] D[place] of [place] Home Phone: [phone] Mobile Phone: [phone] Relation: Spouse Secondary Emergency Contact: Vict[place]uel, [place] Home Phone: [phone] Mobile Phone: [phone] Relation: Son Preferred language: English Interpreter needed? No  Code Status: DNR Goals of care:  Advanced Directive information Advanced Directives 05/11/2017  Does Patient Have a Medical Advance Directive? Yes  Type of Advance Directive Out of facility DNR (pink MOST or yellow form)  Does patient want to make changes to medical advance directive? No - Patient declined  Copy of Healthcare Power of Attorney in Chart? -  Would patient like information on creating a medical advance directive? -     No Known Allergies  Chief Complaint  Patient presents with  . Discharge Note    Discharged from SNF    HPI:  84 y.o. male seen today for discharge evaluation.  Patient was admitted to the facility for rehab following hospitalization at [facility] for sepsis related to UTI.  Patient completed course of p.o. antibiotics.  Sepsis resolved.  Patient developed diarrhea and tested positive for C. difficile.  Patient completed course of p.o. vancomycin with Risa-Bid.  Repeat testing was negative for C. difficile toxin and antigen. Pt's room was cleaned with bleach.  However, unfortunately, patient later redeveloped positive C. difficile.  Patient was placed back on p.o. vancomycin with enteric precautions.  He discharged home with prescription for continuing the p.o. vancomycin.  Patient continues to have indwelling Foley catheter for  urinary retention.  Patient is to follow-up with urology once he is stronger.  Patient continues with severe protein calorie malnutrition.  Patient was started on pro-stat and Ensure Enive along with a multivitamin.  The Ensure was changed to Magic cup due to restraints and thickening.  Patient tolerated these well.  Patient had penile skin erosion that was treated with Bactroban.  This has resolved.  Staff continues to apply derma cloud to the perineal area for skin care.  Patient developed a right knee effusion with unknown etiology.  Patient denied trauma.  X-rays of the knee were negative for anything significant other than soft tissue swelling.  Gentle Ace wrap compression applied to the knee along with ice 4 times daily with the ability to alternate heat and ice if desired.  Patient and wife declined visit to the ED as well as declined visit to orthopedics for now.  Patient reports knee pain has improved.  Visibly, the swelling is significantly decreased.  Patient continues to remain afebrile.  Patient is scheduled to DC home over the weekend.  Patient will be opened to hospice services at home upon discharge.  Overall, patient reports he is feeling better and ready to go home.  Vital signs stable.  No other complaints.    Past Medical History:  Diagnosis Date  . A-fib (HCC)    On Xarelto  . Chronic kidney disease (CKD)   . High cholesterol   . Hypertension   . Inguinal hernia   . Stroke [facility])    With left-sided hemiparesis  . Urinary retention    Chronic Foley catheter since December 2016    Past Surgical History:  Procedure  Laterality Date  . APPENDECTOMY    . PERIPHERAL VASCULAR CATHETERIZATION N/A 11/10/2015   Procedure: Visceral Angiography, mesenteric angio;  Surgeon: Annice Needy, MD;  Location: [facility] INVASIVE CV LAB;  Service: Cardiovascular;  Laterality: N/A;  . PERIPHERAL VASCULAR CATHETERIZATION N/A 11/10/2015   Procedure: Visceral Artery Intervention;  Surgeon: Annice Needy, MD;   Location: [facility] INVASIVE CV LAB;  Service: Cardiovascular;  Laterality: N/A;      reports that he has quit smoking. he has never used smokeless tobacco. He reports that he does not drink alcohol or use drugs. Social History   Socioeconomic History  . Marital status: Married    Spouse name: Not on file  . Number of children: 1  . Years of education: 73  . Highest education level: High school graduate  Social Needs  . Financial resource strain: Patient refused  . Food insecurity - worry: Patient refused  . Food insecurity - inability: Patient refused  . Transportation needs - medical: Patient refused  . Transportation needs - non-medical: Patient refused  Occupational History  . Not on file  Tobacco Use  . Smoking status: Former Games developer  . Smokeless tobacco: Never Used  . Tobacco comment: quit 50 years  Substance and Sexual Activity  . Alcohol use: No    Alcohol/week: 0.0 oz  . Drug use: No  . Sexual activity: No  Other Topics Concern  . Not on file  Social History Narrative   Lives at home with his wife. Bed bound at baseline.   Functional Status Survey:    No Known Allergies  Pertinent  Health Maintenance Due  Topic Date Due  . INFLUENZA VACCINE  10/25/2016  . PNA vac Low Risk Adult (2 of 2 - PCV13) 04/11/2018    Medications: Allergies as of 05/11/2017   No Known Allergies     Medication List        Accurate as of 05/11/17  3:59 PM. Always use your most recent med list.          acetaminophen 325 MG tablet Commonly known as:  TYLENOL Take 650 mg by mouth every 4 (four) hours as needed. for pain/ increased temp. May be administered orally, per G-tube if needed or rectally if unable to swallow (separate order). Maximum dose for 24 hours is 3,000 mg from all sources of Acetaminophen/ Tylenol   apixaban 2.5 MG Tabs tablet Commonly known as:  ELIQUIS Take 1 tablet (2.5 mg total) by mouth 2 (two) times daily.   atorvastatin 80 MG tablet Commonly known as:   LIPITOR Take 80 mg by mouth every evening.   carvedilol 6.25 MG tablet Commonly known as:  COREG Take 1 tablet (6.25 mg total) by mouth 2 (two) times daily.   DERMACLOUD Crea Apply liberal amount topically to area of skin irritation prn. OK to leave at bedside.   diltiazem 120 MG 24 hr capsule Commonly known as:  CARDIZEM CD Take 120 mg by mouth every evening.   docusate sodium 100 MG capsule Commonly known as:  COLACE Take 100 mg by mouth 2 (two) times daily.   ENSURE ENLIVE PO Take 1 Bottle by mouth 3 (three) times daily. For 8 oz bottle, add 2 tablespoons of thickner. Stir for 15 seconds until dissolved.   feeding supplement (PRO-STAT SUGAR FREE 64) Liqd Take 30 mLs by mouth 2 (two) times daily between meals.   ferrous sulfate 325 (65 FE) MG tablet Take 1 tablet (325 mg total) by mouth 2 (two) times  daily with a meal.   food thickener Powd Commonly known as:  THICK IT Take by mouth. Use to mix thin liquids to nectar thick consistency as needed   multivitamin with minerals Tabs tablet Take 1 tablet by mouth daily.   mupirocin cream 2 % Commonly known as:  BACTROBAN Apply thin film topically to open areas on penis 2 times a day until healed   RISA-BID PROBIOTIC Tabs Take 1 tablet by mouth 2 (two) times daily.   sacubitril-valsartan 49-51 MG Commonly known as:  ENTRESTO Take 1 tablet by mouth 2 (two) times daily.       Review of Systems  Constitutional: Negative for activity change, appetite change, chills, diaphoresis and fever.  HENT: Negative for congestion, mouth sores, nosebleeds, postnasal drip, sneezing, sore throat, trouble swallowing and voice change.   Respiratory: Negative for apnea, cough, choking, chest tightness, shortness of breath and wheezing.   Cardiovascular: Negative for chest pain, palpitations and leg swelling.  Gastrointestinal: Positive for diarrhea (improved). Negative for abdominal distention, abdominal pain, constipation and nausea.    Genitourinary: Positive for difficulty urinating (indwelling foley catheter). Negative for dysuria, frequency and urgency.  Musculoskeletal: Positive for joint swelling (improved). Negative for back pain, gait problem and myalgias. Arthralgias: typical arthritis.  Skin: Negative for color change, pallor, rash and wound.  Neurological: Positive for weakness. Negative for dizziness, tremors, syncope, speech difficulty, numbness and headaches.  Psychiatric/Behavioral: Negative for agitation and behavioral problems.  All other systems reviewed and are negative.   Vitals:   05/11/17 1539  BP: 128/66  Pulse: (!) 104  Resp: 20  Temp: 97.9 F (36.6 C)  TempSrc: Oral  SpO2: 96%  Weight: 120 lb 4.8 oz (54.6 kg)  Height: 5\' 8"  (1.727 m)   Body mass index is 18.29 kg/m. Physical Exam  Constitutional: He is oriented to person, place, and time. Vital signs are normal. He appears cachectic. He is active and cooperative. He does not appear ill. No distress.  Temporal wasting  HENT:  Head: Normocephalic and atraumatic.  Mouth/Throat: Uvula is midline, oropharynx is clear and moist and mucous membranes are normal. Mucous membranes are not pale, not dry and not cyanotic.  Eyes: Conjunctivae, EOM and lids are normal. Pupils are equal, round, and reactive to light.  Neck: Trachea normal, normal range of motion and full passive range of motion without pain. Neck supple. No JVD present. No tracheal deviation, no edema and no erythema present. No thyromegaly present.  Cardiovascular: Normal rate, regular rhythm, normal heart sounds, intact distal pulses and normal pulses. Exam reveals no gallop, no distant heart sounds and no friction rub.  No murmur heard. Pulses:      Dorsalis pedis pulses are 2+ on the right side, and 2+ on the left side.  No edema  Pulmonary/Chest: Effort normal and breath sounds normal. No accessory muscle usage. No respiratory distress. He has no decreased breath sounds. He has no  wheezes. He has no rhonchi. He has no rales. He exhibits no tenderness.  Abdominal: Soft. Normal appearance and bowel sounds are normal. He exhibits no distension and no ascites. There is no tenderness.  Musculoskeletal: Normal range of motion. He exhibits no edema or tenderness.       Right knee: He exhibits effusion (improved).  Expected osteoarthritis, stiffness; Bilateral Calves soft, supple. Negative Homan's Sign. B- pedal pulses equal; generalized weakness and bedbound  Neurological: He is alert and oriented to person, place, and time. He has normal strength.  Skin: Skin is  warm, dry and intact. He is not diaphoretic. No cyanosis. No pallor. Nails show no clubbing.  Psychiatric: He has a normal mood and affect. His speech is normal and behavior is normal. Judgment and thought content normal. Cognition and memory are normal.  Nursing note and vitals reviewed.   Labs reviewed: Basic Metabolic Panel: Recent Labs    04/14/17 0433 04/16/17 0401 04/19/17 0600  NA 150* 151* 144  K 5.1 5.1 4.8  CL 117* 115* 110  CO2 25 25 27   GLUCOSE 124* 113* 94  BUN 56* 70* 66*  CREATININE 1.71* 1.52* 1.58*  CALCIUM 8.0* 8.5* 8.0*   Liver Function Tests: Recent Labs    04/08/17 1652 04/19/17 0600  AST 38 155*  ALT 40 136*  ALKPHOS 70 64  BILITOT 0.7 0.6  PROT 6.3* 5.4*  ALBUMIN 2.2* 1.8*   No results for input(s): LIPASE, AMYLASE in the last 8760 hours. No results for input(s): AMMONIA in the last 8760 hours. CBC: Recent Labs    04/12/17 0335 04/13/17 0557 04/14/17 0433 04/15/17 0420 04/16/17 0401  WBC 13.5* 13.5*  --  16.6*  --   HGB 7.5* 9.3* 9.3* 9.8* 10.0*  HCT 22.0* 28.3*  --  29.2*  --   MCV 89.5 92.2  --  93.2  --   PLT 278 308  --  293  --    Cardiac Enzymes: No results for input(s): CKTOTAL, CKMB, CKMBINDEX, TROPONINI in the last 8760 hours. BNP: Invalid input(s): POCBNP CBG: No results for input(s): GLUCAP in the last 8760 hours.  Procedures and Imaging Studies  During Stay: No results found.  Assessment/Plan:   Sepsis, due to unspecified organism [facility])  Resolved  Clostridium difficile colitis  Recurrent  Continue vancomycin 125 mg p.o. 4 times daily until 10-day course complete  Continue Risa-Bid 250 mg p.o. twice daily  Enteric precautions  Family education done regarding hand hygiene  Follow-up with PCP or GI MD ASAP after discharge for continuity of care and medication management  Urinary retention  Ongoing  Continue indwelling Foley catheter  Hospice nursing to monitor  Follow-up with urology as instructed/ASAP after discharge for continuity of care  Skin erosion  Improving  Continue Bactroban cream twice daily until skin irritation completely resolved  Continue derma cloud cream to perineal area as needed for skin irritation  Protein-calorie malnutrition, severe  Continue daily multivitamin  Continue pro-stat 30 mL p.o. twice daily  Continue Magic cup with meals  Continue use of thickener in liquids for aspiration prevention  Follow-up with PCP for ongoing monitoring  Knee effusion, right  Improved  Continue PRN gentle compression with Ace wrap  Continue to elevate knee  Continue ice packs as needed for pain/edema  Follow-up with orthopedist as needed for ongoing or recurrent symptoms  Continue Tylenol 650 mg p.o. every 4 hours as needed pain  Patient is being discharged with the following home health services: [facility]  Patient is being discharged with the following durable medical equipment: Hospital bed, bedside commode, over bed table, Hoyer lift and wheelchair provided by hospice Patient was discharged home via nonemergent EMS  Patient has been advised to f/u with their PCP in 1-2 weeks to bring them up to date on their rehab stay.  Social services at facility was responsible for arranging this appointment.  Pt was provided with a 30 day supply of prescriptions for  medications and refills must be obtained from their PCP.  For controlled substances, a more limited supply may  be provided adequate until PCP appointment only.  Future labs/tests needed:    Family/ staff Communication:   Total Time:  Documentation:  Face to Face:  Family/Phone:  Brynda Rim, NP-C Geriatrics [facility] [address], [place] [address] Cell Phone (Mon-Fri 8am-5pm):  [phone] On Call:  [phone] & follow prompts after 5pm & weekends Office Phone:  [phone] Office Fax:  [phone]

## 2017-05-29 DIAGNOSIS — A0472 Enterocolitis due to Clostridium difficile, not specified as recurrent: Secondary | ICD-10-CM | POA: Insufficient documentation

## 2017-05-29 DIAGNOSIS — M25461 Effusion, right knee: Secondary | ICD-10-CM | POA: Insufficient documentation

## 2017-05-29 DIAGNOSIS — L989 Disorder of the skin and subcutaneous tissue, unspecified: Secondary | ICD-10-CM | POA: Insufficient documentation

## 2018-05-30 ENCOUNTER — Telehealth: Payer: Self-pay | Admitting: Nurse Practitioner

## 2018-05-30 NOTE — Telephone Encounter (Signed)
I called and schedule in-home PC visit with Jordan Kline for June 18, 2018 at 11am

## 2018-07-08 ENCOUNTER — Encounter: Payer: Self-pay | Admitting: Nurse Practitioner

## 2018-07-08 ENCOUNTER — Other Ambulatory Visit: Payer: Medicare HMO | Admitting: Nurse Practitioner

## 2018-07-08 ENCOUNTER — Other Ambulatory Visit: Payer: Self-pay

## 2018-07-08 DIAGNOSIS — Z515 Encounter for palliative care: Secondary | ICD-10-CM

## 2018-07-08 DIAGNOSIS — R630 Anorexia: Secondary | ICD-10-CM | POA: Insufficient documentation

## 2018-07-08 DIAGNOSIS — R5381 Other malaise: Secondary | ICD-10-CM | POA: Insufficient documentation

## 2018-07-08 NOTE — Progress Notes (Signed)
Therapist, nutritional Palliative Care Consult Note Telephone: [phone]  Fax: [phone]  PATIENT NAME: Jordan Kline DOB: Aug 02, 1927 MRN: [id number]  PRIMARY CARE PROVIDER:   Sherron Monday, MD  REFERRING PROVIDER:  Sherron Monday, MD [address] [place], [place] [address]  RESPONSIBLE PARTY:   Wife Tan Clopper [phone]  Due to the COVID-19 crisis, this visit was done via telemedicine from my office and it was initiated and consent by this patient and or family.  RECOMMENDATIONS and PLAN:  1. Palliative care encounter Z51.5; Palliative medicine team will continue to support patient, patient's family, and medical team. Visit consisted of counseling and education dealing with the complex and emotionally intense issues of symptom management and palliative care in the setting of serious and potentially life-threatening illness  2. Anorexia R63.0 but appetite remaining declined. Continue to encourage supplements and comfort feedings. Discuss nutrition  3. Debility R53.81 secondary to late onset CVA progressive, encourage passive rom;   ASSESSMENT:     I call Ms Heidelberg. We talked about purpose of palliative care telephone visit is Telehealth is not possible she does not have Smart device. We talked about the last time he is independent. We talked about past medical history in the study of chronic disease. We reviewed when I did see Mr. Christiansen at [facility] short-term rehab  for palliative console. We talked about the time he spent with hospice services and since hospice discharge due to stability. Ms Nylund endorses functionally he is remain the same, Total Care. He does require assistance with feeding but is able to feed himself. He continues to be on a pureed diet. Ms Hestand endorses he does not like the pureed food but does eat it with a good appetite.we talked about how he has been since hospice discharge. Ms. Holness endorses he has been doing  okay. We talked about medical goc with focus on comfort with DNR in place. We talked about role of palliative care, he appears to be stable at prest time. Therapeutic listening and emotional support provided. Questions answered to satisfaction. PC to continue to follow and monitor, scheduled in 6 weeks if needed or to call sooner if needed. Contact information provided   Palliative care 9/27 / 2019 Palliative care 1/ 25 / 2019 to 2 / 15 / 2019 Hospice 2 / 17 / 2019 to 8 / 15 / 2019  2 / 15 / 2020 Weight 120.4 lbs  I spent 40 minutes providing this consultation,  from 2:30pm to 3:10pm. More than 50% of the time in this consultation was spent coordinating communication.   HISTORY OF PRESENT ILLNESS:  Mackey Varricchio is a 85 y.o. year old male with multiple medical problems including Left-sided hemiparesis, atrial fibrillation, chronic kidney disease, hypertension, hyperlipidemia, bph, history of GI bleed, protein calorie malnutrition, inguinal hernia, appendectomy. Hospitalize 1 / 13 / 2019 to 1 / 21 / 2019 first sepsis with altered mental status secondary to urinary tract infection requiring antibiotic therapy of Rocephin and Keflex. Symptomatic anemia requiring transfusion 2 units and IV iron which stabilized upon discharge. Right elbow bursitis turn on low-dose prednisone. Hypernatremia with poor appetite on dysphagia 3 diet with thin liquids. Acute kidney injury on chronic kidney disease with improve creatinine it discharge of 1.52. Chronic atrial fibrillation remain stable on Eliquis. Congestive heart failure remained on Entrestro. He did have a palliative care consult with wife sharing he  at [facility] could sit on the side of the bed and feed himself.  He went to [facility] for short-term rehab. 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 They've been married for over 50 years and have one son and two  grandchildren. He is a DNR. He  currently lives at home with his wife. He is bed-bound. For doctor's appointments he does get put in a wheelchair and taken to the office. He is totally ADL dependence. He does require to be fed and is on a pureed diet. Palliative Care was asked to help address goals of care.   CODE STATUS: DNR  PPS: 30% HOSPICE ELIGIBILITY/DIAGNOSIS: TBD  PAST MEDICAL HISTORY:  Past Medical History:  Diagnosis Date   A-fib (HCC)    On Xarelto   Chronic kidney disease (CKD)    High cholesterol    Hypertension    Inguinal hernia    Stroke [facility])    With left-sided hemiparesis   Urinary retention    Chronic Foley catheter since December 2016    SOCIAL HX:  Social History   Tobacco Use   Smoking status: Former Smoker   Smokeless tobacco: Never Used   Tobacco comment: quit 50 years  Substance Use Topics   Alcohol use: No    Alcohol/week: 0.0 standard drinks    ALLERGIES: No Known Allergies   PERTINENT MEDICATIONS:  Outpatient Encounter Medications as of 07/08/2018  Medication Sig   acetaminophen (TYLENOL) 325 MG tablet Take 650 mg by mouth every 4 (four) hours as needed. for pain/ increased temp. May be administered orally, per G-tube if needed or rectally if unable to swallow (separate order). Maximum dose for 24 hours is 3,000 mg from all sources of Acetaminophen/ Tylenol   Amino Acids-Protein Hydrolys (FEEDING SUPPLEMENT, PRO-STAT SUGAR FREE 64,) LIQD Take 30 mLs by mouth 2 (two) times daily between meals.   apixaban (ELIQUIS) 2.5 MG TABS tablet Take 1 tablet (2.5 mg total) by mouth 2 (two) times daily.   atorvastatin (LIPITOR) 80 MG tablet Take 80 mg by mouth every evening.    carvedilol (COREG) 6.25 MG tablet Take 1 tablet (6.25 mg total) by mouth 2 (two) times daily.   diltiazem (CARDIZEM CD) 120 MG 24 hr capsule Take 120 mg by mouth every evening.    docusate sodium (COLACE) 100 MG capsule Take 100 mg by mouth 2 (two) times daily.    ferrous sulfate 325 (65 FE) MG  tablet Take 1 tablet (325 mg total) by mouth 2 (two) times daily with a meal.   food thickener (THICK IT) POWD Take by mouth. Use to mix thin liquids to nectar thick consistency as needed   Infant Care Products (DERMACLOUD) CREA Apply liberal amount topically to area of skin irritation prn. OK to leave at bedside.   Multiple Vitamin (MULTIVITAMIN WITH MINERALS) TABS tablet Take 1 tablet by mouth daily.   mupirocin cream (BACTROBAN) 2 % Apply thin film topically to open areas on penis 2 times a day until healed   Nutritional Supplements (ENSURE ENLIVE PO) Take 1 Bottle by mouth 3 (three) times daily. For 8 oz bottle, add 2 tablespoons of thickner. Stir for 15 seconds until dissolved.   Probiotic Product (RISA-BID PROBIOTIC) TABS Take 1 tablet by mouth 2 (two) times daily.   sacubitril-valsartan (ENTRESTO) 49-51 MG Take 1 tablet by mouth 2 (two) times daily.   No facility-administered encounter medications on file as of 07/08/2018.     PHYSICAL EXAM:   Deferred due  to telephone visit  Tereza Gilham Prince RomeZ Caryl Fate, NP

## 2018-08-26 ENCOUNTER — Other Ambulatory Visit: Payer: Medicare HMO | Admitting: Nurse Practitioner

## 2018-08-26 ENCOUNTER — Other Ambulatory Visit: Payer: Self-pay

## 2018-08-26 ENCOUNTER — Encounter: Payer: Self-pay | Admitting: Nurse Practitioner

## 2018-08-26 DIAGNOSIS — Z515 Encounter for palliative care: Secondary | ICD-10-CM

## 2018-08-26 DIAGNOSIS — R630 Anorexia: Secondary | ICD-10-CM

## 2018-08-26 DIAGNOSIS — R5381 Other malaise: Secondary | ICD-10-CM

## 2018-08-26 NOTE — Progress Notes (Signed)
Therap[facility], [facility] Consult Note Telephone: [phone]  Fax: [phone]  PATIENT NAME: Jordan Kline DOB: 02/03/1928 MRN: [id number]  PRIMARY CARE PROVIDER:   Sherron Kline, Jordan Ahmed, MD  REFERRING PROVIDER:  Sherron Kline, Jordan Ahmed, MD [address] [place], [place] [address]  RESPONSIBLE PARTY:   Jordan Kline [phone]  Due to the COVID-19 crisis, this visit was done via telemedicine telephone as video not available per Jordan. Kline from my office and it was initiated and consent by this patient and or family.   RECOMMENDATIONS and PLAN:  1. Palliative care encounter Z51.5; Palliative medicine team will continue to support patient, patient'Jordan family, and medical team. Visit consisted of counseling and education dealing with the complex and emotionally intense issues of symptom management and palliative care in the setting of serious and potentially life-threatening illness  2. Anorexia R63.0 but appetite remaining improving with no noted weight loss. Continue to encourage supplements and comfort feedings. Discuss nutrition  3. Debility R53.81 secondary to late onset CVA progressive, encourage passive rom;   ASSESSMENT:      I called Jordan Kline. We talked about purpose for palliative care visit. We talked about how Jordan Kline is doing. Jordan Kline endorses he is doing the same. No symptoms of pain or shortness of breath. His appetite has been stable. He does not appear to have been losing weight. His cognitive level remains unchanged. He does continue to be able to sit himself up on the side of the bed but requires ADL assistance. He also requires assistance for transfer. No new functional changes. No recent hospitalizations, Falls, wounds, infections. Covid screening negative. Jordan Kline only concern was some red areas on his side side for which dermacloud cream has improved. Jordan Kline asked if it was possible to have that sent to the  pharmacy. Agreed and sent to [facility] [address] [place]. RX dermacloud apply tid to red areas. May refill 12 times. DNR does remain in place. No other changes medical goals remain with focus on Comfort. Talked about role of palliative care and plan of care. Scheduled next palliative care visit for one month for follow up or sooner should he declined. Jordan Kline in agreement. Therapeutic listening and emotional support provided. Contact information provided. Questions answered satisfaction  Rx: sent to [facility] [address] [place]. RX dermacloud apply tid to red areas. May refill 12 times  Palliative care 9/27 / 2019 Palliative care 1/ 25 / 2019 to 2 / 15 / 2019 Hospice 2 / 17 / 2019 to 8 / 15 / 2019  I spent 45 minutes providing this consultation,  from 12:00pm to 12:45pm. More than 50% of the time in this consultation was spent coordinating communication.   HISTORY OF PRESENT ILLNESS:  Jordan Kline is a 85 y.o. year old male with multiple medical problems including Left-sided hemiparesis, atrial fibrillation, chronic kidney disease, hypertension, hyperlipidemia, bph, history of GI bleed, protein calorie malnutrition, inguinal hernia, appendectomy. Palliative Care was asked to help to continue to address goals of care.   CODE STATUS: DNR  PPS: 30% HOSPICE ELIGIBILITY/DIAGNOSIS: TBD  PAST MEDICAL HISTORY:  Past Medical History:  Diagnosis Date  . A-fib (HCC)    On Xarelto  . Chronic kidney disease (CKD)   . High cholesterol   . Hypertension   . Inguinal hernia   . Stroke [facility](HCC)    With left-sided hemiparesis  . Urinary retention    Chronic Foley catheter since  December 2016    SOCIAL HX:  Social History   Tobacco Use  . Smoking status: Former Games developer  . Smokeless tobacco: Never Used  . Tobacco comment: quit 50 years  Substance Use Topics  . Alcohol use: No    Alcohol/week: 0.0 standard drinks    ALLERGIES: No Known Allergies    PERTINENT MEDICATIONS:  Outpatient Encounter Medications as of 08/26/2018  Medication Sig  . acetaminophen (TYLENOL) 325 MG tablet Take 650 mg by mouth every 4 (four) hours as needed. for pain/ increased temp. May be administered orally, per G-tube if needed or rectally if unable to swallow (separate order). Maximum dose for 24 hours is 3,000 mg from all sources of Acetaminophen/ Tylenol  . Amino Acids-Protein Hydrolys (FEEDING SUPPLEMENT, PRO-STAT SUGAR FREE 64,) LIQD Take 30 mLs by mouth 2 (two) times daily between meals.  Marland Kitchen apixaban (ELIQUIS) 2.5 MG TABS tablet Take 1 tablet (2.5 mg total) by mouth 2 (two) times daily.  Marland Kitchen atorvastatin (LIPITOR) 80 MG tablet Take 80 mg by mouth every evening.   . carvedilol (COREG) 6.25 MG tablet Take 1 tablet (6.25 mg total) by mouth 2 (two) times daily.  Marland Kitchen diltiazem (CARDIZEM CD) 120 MG 24 hr capsule Take 120 mg by mouth every evening.   . docusate sodium (COLACE) 100 MG capsule Take 100 mg by mouth 2 (two) times daily.   . ferrous sulfate 325 (65 FE) MG tablet Take 1 tablet (325 mg total) by mouth 2 (two) times daily with a meal.  . food thickener (THICK IT) POWD Take by mouth. Use to mix thin liquids to nectar thick consistency as needed  . Infant Care Products (DERMACLOUD) CREA Apply liberal amount topically to area of skin irritation prn. OK to leave at bedside.  . Multiple Vitamin (MULTIVITAMIN WITH MINERALS) TABS tablet Take 1 tablet by mouth daily.  . mupirocin cream (BACTROBAN) 2 % Apply thin film topically to open areas on penis 2 times a day until healed  . Nutritional Supplements (ENSURE ENLIVE PO) Take 1 Bottle by mouth 3 (three) times daily. For 8 oz bottle, add 2 tablespoons of thickner. Stir for 15 seconds until dissolved.  . Probiotic Product (RISA-BID PROBIOTIC) TABS Take 1 tablet by mouth 2 (two) times daily.  . sacubitril-valsartan (ENTRESTO) 49-51 MG Take 1 tablet by mouth 2 (two) times daily.   No facility-administered encounter medications  on file as of 08/26/2018.     PHYSICAL EXAM:   Deferred Jaylean Buenaventura Z Jalyah Weinheimer, NP

## 2018-09-26 ENCOUNTER — Encounter: Payer: Self-pay | Admitting: Nurse Practitioner

## 2018-09-26 ENCOUNTER — Other Ambulatory Visit: Payer: Medicare HMO | Admitting: Nurse Practitioner

## 2018-09-26 ENCOUNTER — Other Ambulatory Visit: Payer: Self-pay

## 2018-09-26 DIAGNOSIS — R630 Anorexia: Secondary | ICD-10-CM

## 2018-09-26 DIAGNOSIS — Z515 Encounter for palliative care: Secondary | ICD-10-CM

## 2018-09-26 DIAGNOSIS — R5381 Other malaise: Secondary | ICD-10-CM

## 2018-09-26 NOTE — Progress Notes (Signed)
Therap[facility], [facility] Consult Note Telephone: [phone]  Fax: [phone](336) [phone]  PATIENT NAME: Jordan PennaDouglas Sagrero DOB: April 10, 1927 MRN: [id number]  PRIMARY CARE PROVIDER:   [name], S Ahmed, MD  REFERRING PROVIDER:  [name], S Ahmed, MD [address] [place],  [place] [address]  RESPONSIBLE PARTY:   Wife Everlena CooperLister Parodi [phone]  Due to the COVID-19 crisis, this visit was done via telemedicine from my office and it was initiated and consent by this patient and or family.  RECOMMENDATIONS and PLAN:  1. Palliative care encounter Z51.5; Palliative medicine team will continue to support patient, patient's family, and medical team. Visit consisted of counseling and education dealing with the complex and emotionally intense issues of symptom management and palliative care in the setting of serious and potentially life-threatening illness  2. Anorexia R63.0 appetite remaining improving. Continue to encourage supplements and comfort feedings. Discuss nutrition  3. Debility R53.81 secondary to late onset CVA progressive, encourage passive rom;   ASSESSMENT:     I called Ms Alferd PateeRichmond for schedule palliative care follow-up visit for Mr [place]. He does continue to reside at home with Ms [place]. We talked to that purpose of palliative care visit in Ms [place] in agreement. We talked about how [name] has been feeling today. She shared that he is doing about the same. Functionally he is no longer able to sit up on his own as he is Total Care including turning and positioning. We talked about the reading areas for which dermacloud was sent to the pharmacy. Ms Alferd PateeRichmond endorses that Pharmacy discontinue that medication and did not offer her an alternative as requested. She talked about using zinc cream which seems to be effective and at present time that is what she wants to continue using. No break down just areas of redness intermittently. We talked about his  appetite which has been good. No visualize weight loss per Ms [place]. He does not appear to be in pain. They are continuing to stay home bound for self protection from covid-19. Ms Alferd PateeRichmond endorses at present time he does continue to be stable. We talked about medical goals of care including DNR which she does have in the home. Asked if it was okay to complete a second DNR form to be able to enter into epic and Ms Alferd PateeRichmond in agreement. Will mail Ms Alferd PateeRichmond the new form. We talked about medical goals the focus on comfort as previously discussed. We talked about role of palliative care and plan of care. Discuss that at present time since he is stable will follow up in six weeks if needed or sooner should he declined. Ms [place] and agreement. Therapeutic listening and emotional support provided. Questions answered to satisfaction. Contact information provided.  , MSN, NP-C, ACHPN, RN   I spent 30 minutes providing this consultation,  from 11:00am to 11:30am. More than 50% of the time in this consultation was spent coordinating communication.    HISTORY OF PRESENT ILLNESS:  Jordan Kline is a 85 y.o. year old male with multiple medical problems includingLeft-sided hemiparesis, atrial fibrillation, chronic kidney disease, hypertension, hyperlipidemia, bph, history of GI bleed, protein calorie malnutrition, inguinal hernia, appendectom. Palliative Care was asked to help to continue to address goals of care.   CODE STATUS: DNR  PPS: 30% HOSPICE ELIGIBILITY/DIAGNOSIS: TBD  PAST MEDICAL HISTORY:  Past Medical History:  Diagnosis Date  . A-fib (HCC)    On Xarelto  . Chronic kidney disease (CKD)   . High cholesterol   .  Hypertension   . Inguinal hernia   . Stroke [facility])    With left-sided hemiparesis  . Urinary retention    Chronic Foley catheter since December 2016    SOCIAL HX:  Social History   Tobacco Use  . Smoking status: Former Research scientist (life sciences)  . Smokeless tobacco: Never Used   . Tobacco comment: quit 50 years  Substance Use Topics  . Alcohol use: No    Alcohol/week: 0.0 standard drinks    ALLERGIES: No Known Allergies   PERTINENT MEDICATIONS:  Outpatient Encounter Medications as of 09/26/2018  Medication Sig  . acetaminophen (TYLENOL) 325 MG tablet Take 650 mg by mouth every 4 (four) hours as needed. for pain/ increased temp. May be administered orally, per G-tube if needed or rectally if unable to swallow (separate order). Maximum dose for 24 hours is 3,000 mg from all sources of Acetaminophen/ Tylenol  . Amino Acids-Protein Hydrolys (FEEDING SUPPLEMENT, PRO-STAT SUGAR FREE 64,) LIQD Take 30 mLs by mouth 2 (two) times daily between meals.  Marland Kitchen apixaban (ELIQUIS) 2.5 MG TABS tablet Take 1 tablet (2.5 mg total) by mouth 2 (two) times daily.  Marland Kitchen atorvastatin (LIPITOR) 80 MG tablet Take 80 mg by mouth every evening.   . carvedilol (COREG) 6.25 MG tablet Take 1 tablet (6.25 mg total) by mouth 2 (two) times daily.  Marland Kitchen diltiazem (CARDIZEM CD) 120 MG 24 hr capsule Take 120 mg by mouth every evening.   . docusate sodium (COLACE) 100 MG capsule Take 100 mg by mouth 2 (two) times daily.   . ferrous sulfate 325 (65 FE) MG tablet Take 1 tablet (325 mg total) by mouth 2 (two) times daily with a meal.  . food thickener (THICK IT) POWD Take by mouth. Use to mix thin liquids to nectar thick consistency as needed  . Infant Care Products (DERMACLOUD) CREA Apply liberal amount topically to area of skin irritation prn. OK to leave at bedside.  . Multiple Vitamin (MULTIVITAMIN WITH MINERALS) TABS tablet Take 1 tablet by mouth daily.  . mupirocin cream (BACTROBAN) 2 % Apply thin film topically to open areas on penis 2 times a day until healed  . Nutritional Supplements (ENSURE ENLIVE PO) Take 1 Bottle by mouth 3 (three) times daily. For 8 oz bottle, add 2 tablespoons of thickner. Stir for 15 seconds until dissolved.  . Probiotic Product (RISA-BID PROBIOTIC) TABS Take 1 tablet by mouth 2  (two) times daily.  . sacubitril-valsartan (ENTRESTO) 49-51 MG Take 1 tablet by mouth 2 (two) times daily.   No facility-administered encounter medications on file as of 09/26/2018.     PHYSICAL EXAM:   Deferred   Z , NP

## 2018-11-19 ENCOUNTER — Other Ambulatory Visit: Payer: Self-pay

## 2018-11-19 ENCOUNTER — Encounter: Payer: Self-pay | Admitting: Nurse Practitioner

## 2018-11-19 ENCOUNTER — Other Ambulatory Visit: Payer: Medicare HMO | Admitting: Nurse Practitioner

## 2018-11-19 DIAGNOSIS — Z515 Encounter for palliative care: Secondary | ICD-10-CM

## 2018-11-19 NOTE — Progress Notes (Signed)
Therap[facility], [facility] Consult Note Telephone: [phone]  Fax: [phone](336) [phone]  PATIENT NAME: Jordan PennaDouglas Sprankle DOB: Nov 08, 1927 MRN: [id number]  PRIMARY CARE PROVIDER:   [name], S Ahmed, MD  REFERRING PROVIDER:  [name], S Ahmed, MD [address] [place],  [place] [address]  RESPONSIBLE PARTY:   Wife Everlena CooperLister Entwistle [phone]  Due to the COVID-19 crisis, this visit was done via telemedicine from my office and it was initiated and consent by this patient and or family.  RECOMMENDATIONS and PLAN:  1. ACP: DNR in place with GOC to focus on comfort  2. Anorexia R63.0 improving. Continue to encourage supplements and comfort feedings. Discuss nutrition, continue to pureed foods,   3. Debility R53.81 secondary to late onset CVA progressive, encourage passive rom;   4. Palliative care encounter; Palliative medicine team will continue to support patient, patient's family, and medical team. Visit consisted of counseling and education dealing with the complex and emotionally intense issues of symptom management and palliative care in the setting of serious and potentially life-threatening illness  Palliative care 9/27 / 2019 Palliative care 1/ 25 / 2019 to 2 / 15 / 2019 Hospice 2 / 17 / 2019 to 8 / 15 / 2019  I spent 30 minutes providing this consultation,  from 4:00pm to 4:30pm. More than 50% of the time in this consultation was spent coordinating communication.   HISTORY OF PRESENT ILLNESS:  Jordan Kline is a 85 y.o. year old male with multiple medical problems including Left-sided hemiparesis, atrial fibrillation, chronic kidney disease, hypertension, hyperlipidemia, bph, history of GI bleed, protein calorie malnutrition, inguinal hernia, appendectomy. I called Ms Alferd PateeRichmond for schedule palliative care follow-up visit for Mr [place]. Ms Alferd PateeRichmond and I talked about purpose for palliative care visit and Ms Alferd PateeRichmond and agreement. We talked about  symptoms of pain, shortness of breath, cough, fever which Mr. Alferd PateeRichmond has not exhibited. Appetite has improving though he does not like the texture of the food which is puree. Ms Alferd PateeRichmond endorses at times she needs to be more persistent with getting him to eat. We talked about his functional level as he remains in bed and she continues to turn him. Red area on his sacrum has resolved. We talked about no recent hospitalizations, infections, wounds, falls. At present time [name] remain stable, no decline. Medical goals are to focus on comfort with DNR already in place. We talked about role of palliative care and plan of care. Discussed that will follow up in six weeks if needed or sooner should he declined. Ms [place]in agreement and appointment schedule. Questions answered to satisfaction. Contact information.  Palliative Care was asked to help to continue to address goals of care.   CODE STATUS: DNR  PPS: 30% HOSPICE ELIGIBILITY/DIAGNOSIS: TBD  PAST MEDICAL HISTORY:  Past Medical History:  Diagnosis Date   A-fib (HCC)    On Xarelto   Chronic kidney disease (CKD)    High cholesterol    Hypertension    Inguinal hernia    Stroke [facility](HCC)    With left-sided hemiparesis   Urinary retention    Chronic Foley catheter since December 2016    SOCIAL HX:  Social History   Tobacco Use   Smoking status: Former Smoker   Smokeless tobacco: Never Used   Tobacco comment: quit 50 years  Substance Use Topics   Alcohol use: No    Alcohol/week: 0.0 standard drinks    ALLERGIES: No Known Allergies   PERTINENT MEDICATIONS:  Outpatient Encounter  Medications as of 11/19/2018  Medication Sig   acetaminophen (TYLENOL) 325 MG tablet Take 650 mg by mouth every 4 (four) hours as needed. for pain/ increased temp. May be administered orally, per G-tube if needed or rectally if unable to swallow (separate order). Maximum dose for 24 hours is 3,000 mg from all sources of Acetaminophen/ Tylenol    Amino Acids-Protein Hydrolys (FEEDING SUPPLEMENT, PRO-STAT SUGAR FREE 64,) LIQD Take 30 mLs by mouth 2 (two) times daily between meals.   apixaban (ELIQUIS) 2.5 MG TABS tablet Take 1 tablet (2.5 mg total) by mouth 2 (two) times daily.   atorvastatin (LIPITOR) 80 MG tablet Take 80 mg by mouth every evening.    carvedilol (COREG) 6.25 MG tablet Take 1 tablet (6.25 mg total) by mouth 2 (two) times daily.   diltiazem (CARDIZEM CD) 120 MG 24 hr capsule Take 120 mg by mouth every evening.    docusate sodium (COLACE) 100 MG capsule Take 100 mg by mouth 2 (two) times daily.    ferrous sulfate 325 (65 FE) MG tablet Take 1 tablet (325 mg total) by mouth 2 (two) times daily with a meal.   food thickener (THICK IT) POWD Take by mouth. Use to mix thin liquids to nectar thick consistency as needed   Infant Care Products (DERMACLOUD) CREA Apply liberal amount topically to area of skin irritation prn. OK to leave at bedside.   Multiple Vitamin (MULTIVITAMIN WITH MINERALS) TABS tablet Take 1 tablet by mouth daily.   mupirocin cream (BACTROBAN) 2 % Apply thin film topically to open areas on penis 2 times a day until healed   Nutritional Supplements (ENSURE ENLIVE PO) Take 1 Bottle by mouth 3 (three) times daily. For 8 oz bottle, add 2 tablespoons of thickner. Stir for 15 seconds until dissolved.   Probiotic Product (RISA-BID PROBIOTIC) TABS Take 1 tablet by mouth 2 (two) times daily.   sacubitril-valsartan (ENTRESTO) 49-51 MG Take 1 tablet by mouth 2 (two) times daily.   No facility-administered encounter medications on file as of 11/19/2018.     PHYSICAL EXAM:   Deferred  Mathayus Stanbery Z Felis Quillin, NP

## 2018-11-20 ENCOUNTER — Other Ambulatory Visit: Payer: Self-pay

## 2018-12-26 ENCOUNTER — Other Ambulatory Visit: Payer: Self-pay

## 2018-12-26 ENCOUNTER — Other Ambulatory Visit: Payer: Medicare HMO | Admitting: Nurse Practitioner

## 2018-12-26 ENCOUNTER — Telehealth: Payer: Self-pay | Admitting: Nurse Practitioner

## 2018-12-26 NOTE — Telephone Encounter (Signed)
I called Jordan Kline for scheduled telemedicine/telephonic PC f/u visit. Ms. Sturdivant endorses he is doing well, wished to rescheduled visit to Jan 23, 2019 at 12pm. Rescheduled done, she will call sooner if needed

## 2019-01-23 ENCOUNTER — Encounter: Payer: Self-pay | Admitting: Nurse Practitioner

## 2019-01-23 ENCOUNTER — Other Ambulatory Visit: Payer: Medicare HMO | Admitting: Nurse Practitioner

## 2019-01-23 ENCOUNTER — Other Ambulatory Visit: Payer: Self-pay

## 2019-01-23 DIAGNOSIS — Z515 Encounter for palliative care: Secondary | ICD-10-CM

## 2019-01-23 NOTE — Progress Notes (Signed)
Therapist, nutritional Palliative Care Consult Note Telephone: [phone]  Fax: [phone]  PATIENT NAME: Jordan Kline DOB: 13-Jun-1927 MRN: [id number]  PRIMARY CARE PROVIDER:   Sherron Monday, MD  REFERRING PROVIDER:  Sherron Monday, MD [address] [place],  [place] [address]  RESPONSIBLE PARTY:   RESPONSIBLE PARTY:   Wife Jordan Kline [id number]  Due to the COVID-19 crisis, this visit was done via telemedicine from my office and it was initiated and consent by this patient and or family.  RECOMMENDATIONS and PLAN: 1.ACP: DNR in place with GOC to focus on comfort  2.Anorexia R63.0 continues to improve. Continue to encourage supplements and comfort feedings. Discuss nutrition, continue to pureed foods,   3.Debility R53.81 secondary to late onset CVA progressive, encourage passive rom;  4. Palliative care encounter; Palliative medicine team will continue to support patient, patient's family, and medical team. Visit consisted of counseling and education dealing with the complex and emotionally intense issues of symptom management and palliative care in the setting of serious and potentially life-threatening illness  Palliative care 9/27 / 2019 Palliative care 1/ 25 / 2019 to 2 / 15 / 2019 Hospice 2 / 17 / 2019 to 8 / 15 / 2019  I spent 35 minutes providing this consultation,  from 12:00pm to 12:35pm. More than 50% of the time in this consultation was spent coordinating communication.   HISTORY OF PRESENT ILLNESS:  Jordan Kline is a 85 y.o. year old male with multiple medical problems including Left-sided hemiparesis, atrial fibrillation, chronic kidney disease, hypertension, hyperlipidemia, bph, history of GI bleed, protein calorie malnutrition, inguinal hernia, appendectomy. I called Jordan Kline, Jordan Kline's wife schedule telemedicine / telephonic palliative care follow-up visit. Jordan Kline and agreement. We talked about purpose of  palliative care visit. We talked about how Jordan Kline has been feeling. Jordan Kline endorses that he's doing the same. No new changes. He does have a reddened area on his sacral area which does continue to come and go for which she uses ointment. No recent Falls, hospitalizations, infections. Jordan Kline endorses they are trying to stay at home out of exposure. Jordan Kline denies Jordan. Kline having a cough or fever. Appetite continues to be good. Functional level remains unchanged. We talked about medical goals of care, DNR remains in place. Talked about role of palliative care and plan of care. Discussed follow-up visit for palliative care in 2 months if needed or Center should he declined, Jordan Kline and agreement and appointment scheduled. Questions answered to satisfaction. Contact information provided. Emotional support provided. Palliative Care was asked to help to continue to address goals of care.   CODE STATUS: DNR  PPS: 30% HOSPICE ELIGIBILITY/DIAGNOSIS: TBD  PAST MEDICAL HISTORY:  Past Medical History:  Diagnosis Date   A-fib (HCC)    On Xarelto   Chronic kidney disease (CKD)    High cholesterol    Hypertension    Inguinal hernia    Stroke [facility])    With left-sided hemiparesis   Urinary retention    Chronic Foley catheter since December 2016    SOCIAL HX:  Social History   Tobacco Use   Smoking status: Former Smoker   Smokeless tobacco: Never Used   Tobacco comment: quit 50 years  Substance Use Topics   Alcohol use: No    Alcohol/week: 0.0 standard drinks    ALLERGIES: No Known Allergies   PERTINENT MEDICATIONS:  Outpatient Encounter Medications as of 01/23/2019  Medication Sig   acetaminophen (  TYLENOL) 325 MG tablet Take 650 mg by mouth every 4 (four) hours as needed. for pain/ increased temp. May be administered orally, per G-tube if needed or rectally if unable to swallow (separate order). Maximum dose for 24 hours is 3,000 mg from all sources of  Acetaminophen/ Tylenol   Amino Acids-Protein Hydrolys (FEEDING SUPPLEMENT, PRO-STAT SUGAR FREE 64,) LIQD Take 30 mLs by mouth 2 (two) times daily between meals.   apixaban (ELIQUIS) 2.5 MG TABS tablet Take 1 tablet (2.5 mg total) by mouth 2 (two) times daily.   atorvastatin (LIPITOR) 80 MG tablet Take 80 mg by mouth every evening.    carvedilol (COREG) 6.25 MG tablet Take 1 tablet (6.25 mg total) by mouth 2 (two) times daily.   diltiazem (CARDIZEM CD) 120 MG 24 hr capsule Take 120 mg by mouth every evening.    docusate sodium (COLACE) 100 MG capsule Take 100 mg by mouth 2 (two) times daily.    ferrous sulfate 325 (65 FE) MG tablet Take 1 tablet (325 mg total) by mouth 2 (two) times daily with a meal.   food thickener (THICK IT) POWD Take by mouth. Use to mix thin liquids to nectar thick consistency as needed   Infant Care Products (DERMACLOUD) CREA Apply liberal amount topically to area of skin irritation prn. OK to leave at bedside.   Multiple Vitamin (MULTIVITAMIN WITH MINERALS) TABS tablet Take 1 tablet by mouth daily.   mupirocin cream (BACTROBAN) 2 % Apply thin film topically to open areas on penis 2 times a day until healed   Nutritional Supplements (ENSURE ENLIVE PO) Take 1 Bottle by mouth 3 (three) times daily. For 8 oz bottle, add 2 tablespoons of thickner. Stir for 15 seconds until dissolved.   Probiotic Product (RISA-BID PROBIOTIC) TABS Take 1 tablet by mouth 2 (two) times daily.   sacubitril-valsartan (ENTRESTO) 49-51 MG Take 1 tablet by mouth 2 (two) times daily.   No facility-administered encounter medications on file as of 01/23/2019.     PHYSICAL EXAM:  Deferred  Trell Secrist Z Mycal Conde, NP

## 2019-02-07 ENCOUNTER — Other Ambulatory Visit: Payer: Self-pay

## 2019-02-07 ENCOUNTER — Emergency Department
Admission: EM | Admit: 2019-02-07 | Discharge: 2019-02-07 | Disposition: A | Discharge status: Home / Self Care | Payer: Medicare HMO | Attending: Emergency Medicine | Admitting: Emergency Medicine

## 2019-02-07 ENCOUNTER — Telehealth: Payer: Self-pay | Admitting: Family Medicine

## 2019-02-07 DIAGNOSIS — N189 Chronic kidney disease, unspecified: Secondary | ICD-10-CM | POA: Diagnosis not present

## 2019-02-07 DIAGNOSIS — Y732 Prosthetic and other implants, materials and accessory gastroenterology and urology devices associated with adverse incidents: Secondary | ICD-10-CM | POA: Insufficient documentation

## 2019-02-07 DIAGNOSIS — I129 Hypertensive chronic kidney disease with stage 1 through stage 4 chronic kidney disease, or unspecified chronic kidney disease: Secondary | ICD-10-CM | POA: Insufficient documentation

## 2019-02-07 DIAGNOSIS — Z87891 Personal history of nicotine dependence: Secondary | ICD-10-CM | POA: Insufficient documentation

## 2019-02-07 DIAGNOSIS — Z79899 Other long term (current) drug therapy: Secondary | ICD-10-CM | POA: Diagnosis not present

## 2019-02-07 DIAGNOSIS — T839XXA Unspecified complication of genitourinary prosthetic device, implant and graft, initial encounter: Secondary | ICD-10-CM | POA: Diagnosis not present

## 2019-02-07 NOTE — ED Notes (Signed)
Called supply chain to request urology cart, per Dr. Cinda Quest, pt needs a 16 Fr, 5cc coude catheter

## 2019-02-07 NOTE — ED Provider Notes (Signed)
[facility] Emergency Department Provider Note   ____________________________________________   First MD Initiated Contact with Patient 02/07/19 1518     (approximate)  I have reviewed the triage vital signs and the nursing notes.   HISTORY  Chief Complaint cathether issue    HPI Jordan Kline is a 85 y.o. male patient reports his Foley fell out last night.  It looks like the balloon has been the leak and deflated.  He uses a 16 [place] coud Foley.        Past Medical History:  Diagnosis Date  . A-fib ([place])    On Xarelto  . Chronic kidney disease (CKD)   . High cholesterol   . Hypertension   . Inguinal hernia   . Stroke [facility])    With left-sided hemiparesis  . Urinary retention    Chronic Foley catheter since December 2016    Patient Active Problem List   Diagnosis Date Noted  . Anorexia 07/08/2018  . Debility 07/08/2018  . Clostridium difficile colitis 05/29/2017  . Skin erosion 05/29/2017  . Knee effusion, right 05/29/2017  . Goals of care, counseling/discussion   . Palliative care encounter   . Sepsis ([place]) 04/08/2017  . Protein-calorie malnutrition, severe 11/09/2015  . GIB (gastrointestinal bleeding) 11/08/2015  . Recurrent UTI 07/12/2015  . UTI (lower urinary tract infection) 03/01/2015  . Urinary retention 03/01/2015    Past Surgical History:  Procedure Laterality Date  . APPENDECTOMY    . PERIPHERAL VASCULAR CATHETERIZATION N/A 11/10/2015   Procedure: Visceral Angiography, mesenteric angio;  Surgeon: Algernon Huxley, MD;  Location: [place] CV LAB;  Service: Cardiovascular;  Laterality: N/A;  . PERIPHERAL VASCULAR CATHETERIZATION N/A 11/10/2015   Procedure: Visceral Artery Intervention;  Surgeon: Algernon Huxley, MD;  Location: [place] CV LAB;  Service: Cardiovascular;  Laterality: N/A;    Prior to Admission medications   Medication Sig Start Date End Date Taking? Authorizing Provider  acetaminophen (TYLENOL) 325  MG tablet Take 650 mg by mouth every 4 (four) hours as needed. for pain/ increased temp. May be administered orally, per G-tube if needed or rectally if unable to swallow (separate order). Maximum dose for 24 hours is 3,000 mg from all sources of Acetaminophen/ Tylenol    [provider]  Amino Acids-Protein Hydrolys (FEEDING SUPPLEMENT, PRO-STAT SUGAR FREE 64,) LIQD Take 30 mLs by mouth 2 (two) times daily between meals.    [provider]  apixaban (ELIQUIS) 2.5 MG TABS tablet Take 1 tablet (2.5 mg total) by mouth 2 (two) times daily. 04/16/17   Loletha Grayer, MD  atorvastatin (LIPITOR) 80 MG tablet Take 80 mg by mouth every evening.     [provider]  carvedilol (COREG) 6.25 MG tablet Take 1 tablet (6.25 mg total) by mouth 2 (two) times daily. 04/16/17   Loletha Grayer, MD  diltiazem (CARDIZEM CD) 120 MG 24 hr capsule Take 120 mg by mouth every evening.     [provider]  docusate sodium (COLACE) 100 MG capsule Take 100 mg by mouth 2 (two) times daily.     [provider]  ferrous sulfate 325 (65 FE) MG tablet Take 1 tablet (325 mg total) by mouth 2 (two) times daily with a meal. 03/04/15   Dustin Flock, MD  food thickener (THICK IT) POWD Take by mouth. Use to mix thin liquids to nectar thick consistency as needed    [provider]  [place] (DERMACLOUD) CREA Apply liberal amount topically to  area of skin irritation prn. OK to leave at bedside.    [provider]  Multiple Vitamin (MULTIVITAMIN WITH MINERALS) TABS tablet Take 1 tablet by mouth daily.    [provider]  mupirocin cream (BACTROBAN) 2 % Apply thin film topically to open areas on penis 2 times a day until healed    [provider]  Nutritional Supplements (ENSURE ENLIVE PO) Take 1 Bottle by mouth 3 (three) times daily. For 8 oz bottle, add 2 tablespoons of thickner. Stir for 15 seconds until dissolved.    [provider]   Probiotic Product (RISA-BID PROBIOTIC) TABS Take 1 tablet by mouth 2 (two) times daily.    [provider]  sacubitril-valsartan (ENTRESTO) 49-51 MG Take 1 tablet by mouth 2 (two) times daily. 04/16/17   Alford Highland, MD    Allergies Patient has no known allergies.  Family History  Problem Relation Age of Onset  . Hypertension Father   . Kidney cancer Neg Hx   . Kidney disease Neg Hx   . Prostate cancer Neg Hx     Social History Social History   Tobacco Use  . Smoking status: Former Games developer  . Smokeless tobacco: Never Used  . Tobacco comment: quit 50 years  Substance Use Topics  . Alcohol use: No    Alcohol/week: 0.0 standard drinks  . Drug use: No    Review of Systems  Constitutional: No fever/chills Eyes: No visual changes. ENT: No sore throat. Cardiovascular: Denies chest pain. Respiratory: Denies shortness of breath. Gastrointestinal: No abdominal pain.  No nausea, no vomiting.  No diarrhea.  No constipation. Genitourinary: Negative for dysuria. Musculoskeletal: Negative for back pain. Skin: Negative for rash. Neurological: Negative for headaches, focal weakness   ____________________________________________   PHYSICAL EXAM:  VITAL SIGNS: ED Triage Vitals  Enc Vitals Group     BP 02/07/19 1457 (!) 176/108     Pulse Rate 02/07/19 1457 94     Resp 02/07/19 1457 20     Temp 02/07/19 1457 98.5 F (36.9 C)     Temp Source 02/07/19 1457 Oral     SpO2 02/07/19 1457 99 %     Weight 02/07/19 1453 125 lb (56.7 kg)     Height 02/07/19 1453 5\' 5"  (1.651 m)     Head Circumference --      Peak Flow --      Pain Score 02/07/19 1453 0     Pain Loc --      Pain Edu? --      Excl. in GC? --     Constitutional: Alert and oriented. Well appearing and in no acute distress. Eyes: Conjunctivae are normal.  Head: Atraumatic. Nose: No congestion/rhinnorhea. Mouth/Throat: Mucous membranes are moist.  Oropharynx non-erythematous. Neck: No stridor.    Cardiovascular: Normal rate, regular rhythm. Grossly normal heart sounds.  Good peripheral circulation. Respiratory: Normal respiratory effort.  No retractions. Lungs CTAB. Gastrointestinal: Soft and nontender. No distention. No abdominal bruits. No CVA tenderness. Musculoskeletal: No lower extremity tenderness nor edema.   Neurologic:  Normal speech and language. No new gross focal neurologic deficits are appreciated.  Patient status post stroke with left-sided plegia. Skin:  Skin is warm, dry and intact. No rash noted. Psychiatric: Mood and affect are normal. Speech and behavior are normal.  ____________________________________________   LABS (all labs ordered are listed, but only abnormal results are displayed)  Labs Reviewed - No data to display ____________________________________________  EKG   ____________________________________________  RADIOLOGY  ED  MD interpretation:   Official radiology report(s): No results found.  ____________________________________________   PROCEDURES  Procedure(s) performed (including Critical Care):  Procedures   ____________________________________________   INITIAL IMPRESSION / ASSESSMENT AND PLAN / ED COURSE  Foley catheter replaced patient doing well will let them go.             ____________________________________________   FINAL CLINICAL IMPRESSION(S) / ED DIAGNOSES  Final diagnoses:  Foley catheter problem, initial encounter [facility](HCC)     ED Discharge Orders    None       Note:  This document was prepared using Dragon voice recognition software and may include unintentional dictation errors.    Arnaldo NatalMalinda, Aleksei Goodlin F, MD 02/07/19 [phone]

## 2019-02-07 NOTE — ED Triage Notes (Signed)
Pt comes via [facility] from home with c/o catheter problem. EMS reports wife stated that the patients cath came dislodged this am. She also stated that he has a special one.  Pt denies nay pain at this time. Pt is alert and very talkative.  EMS did report BP-190/112. Pt has hx of stroke with left sided weakness and little slurred speech. Pt is bed bound.

## 2019-02-07 NOTE — ED Notes (Signed)
[facility]  CALLED  FOR  TRANSPORT  HOME 

## 2019-02-07 NOTE — ED Notes (Signed)
Per wife pt will have to go back by EMS when he is discharged due to him being bed bound

## 2019-02-07 NOTE — ED Notes (Signed)
Family at bedside. 

## 2019-02-07 NOTE — Discharge Instructions (Addendum)
Please return for any further problems °

## 2019-02-07 NOTE — Telephone Encounter (Signed)
Jordan Kline patient's home health nurse called stating patient's catheter fell out. He is only allowed 1 per month. He has been getting the catheter changed monthly. She was asking if we could give her a catheter. After looking at his chart. He as not been seen in our office in over 2 years. I informed her that he will need to make an appointment before we can give him a catheter. He may need to go to the ER to have it replaced. I also informed her that he may need evaluation if the catheter fell out to be sure he didn't cause any damage. She id not want to schedule an appointment at this time. She states she will call back.

## 2019-03-17 ENCOUNTER — Encounter: Payer: Self-pay | Admitting: Nurse Practitioner

## 2019-03-17 ENCOUNTER — Other Ambulatory Visit: Payer: Self-pay

## 2019-03-17 ENCOUNTER — Other Ambulatory Visit: Payer: Medicare HMO | Admitting: Nurse Practitioner

## 2019-03-17 DIAGNOSIS — Z515 Encounter for palliative care: Secondary | ICD-10-CM

## 2019-03-17 NOTE — Progress Notes (Signed)
Therapist, nutritional Palliative Care Consult Note Telephone: [phone]  Fax: [phone]  PATIENT NAME: Jordan Kline DOB: 07/27/1927 MRN: [id number]  PRIMARY CARE PROVIDER:   Sherron Monday, MD  REFERRING PROVIDER:  Sherron Monday, MD [address] [place],  [place] [address]  RESPONSIBLE PARTY:   RESPONSIBLE PARTY:Wife Jordan Kline [id number]  Due to the COVID-19 crisis, this visit was done via telemedicine from my office and it was initiated and consent by this patient and or family.  RECOMMENDATIONS and PLAN: 1.ACP: DNR in place with GOC to focus on comfort  2.Anorexia continues to improve. Continue to encourage supplements and comfort feedings. Discuss nutrition, continue to pureed foods,  3.Debility secondary to late onset CVA progressive, encourage passive rom;  4.Palliative care encounter;Palliative medicine team will continue to support patient, patient's family, and medical team. Visit consisted of counseling and education dealing with the complex and emotionally intense issues of symptom management and palliative care in the setting of serious and potentially life-threatening illness  Palliative care 9/27 / 2019 Palliative care 1/ 25 / 2019 to 2 / 15 / 2019 Hospice 2 / 17 / 2019 to 8 / 15 / 2019  I spent 40 minutes providing this consultation,  from 12:00pm to 12:40pm. More than 50% of the time in this consultation was spent coordinating communication.   HISTORY OF PRESENT ILLNESS:  Jordan Kline is a 85 y.o. year old male with multiple medical problems including Left-sided hemiparesis, atrial fibrillation, chronic kidney disease, hypertension, hyperlipidemia, bph, history of GI bleed, protein calorie malnutrition, inguinal hernia, appendectomy. I called Jordan Kline for schedule palliative care telemedicine telephonic follow up visit. Jordan Kline and agreement. Jordan Kline endorses no new changes with Jordan Kline.  Jordan Kline remains ADL dependent,  assistance for transfers. Jordan Kline indoor says that Jordan. Kline appetite has been good. No noted weight loss. No wounds, hospitalizations, infections, falls. Jordan Bezek endorses they are trying to stay in they're home. Their daughter-in-law brings groceries and son checks on them daily. We talked about their Thanksgiving plans where they did make food, family picked up the food and brought it to the family gathering and then brought Jordan and Jordan Kline back their own plates. We talked about their Christmas plans. We talked about covid-19 pandemic. We talked about the importance of staying safe, masking, social distancing. We talked about medical goals of care. We talked about Jordan Kline having to bring Jordan Kline to the ER to have his catheter replaced as it had come out at home. He is followed by [facility] home health and they did not have the appropriate size replacement. Jordan Kline endorses Jordan Kline is trying to get an extra catheter in the home for emergency purposes this would prevent an emergency visit. Jordan Kline endorses that she did try to call the Urology office but since it had been some time since he have been seen and covered pan demek they declined the visit. 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 We talked about role of palliative care and plan of care. Therapeutic listening and emotional support provided. We talked about follow-up palliative care visit six weeks if needed or sooner should he declined. Jordan Kline and agreement. Appointment schedule. Therapeutic listening and emotional support provided. Questions answer to satisfaction. Contact information  Palliative Care was asked to help address goals of care.   CODE STATUS: DNR  PPS: 30% HOSPICE ELIGIBILITY/DIAGNOSIS: TBD  PAST MEDICAL HISTORY:  Past Medical History:  Diagnosis Date  . A-fib  ([place])    On Xarelto  . Chronic kidney disease (CKD)   . High cholesterol   . Hypertension   . Inguinal hernia   . Stroke [facility])    With left-sided hemiparesis  . Urinary retention    Chronic Foley catheter since December 2016    SOCIAL HX:  Social History   Tobacco Use  . Smoking status: Former Research scientist (life sciences)  . Smokeless tobacco: Never Used  . Tobacco comment: quit 50 years  Substance Use Topics  . Alcohol use: No    Alcohol/week: 0.0 standard drinks    ALLERGIES: No Known Allergies   PERTINENT MEDICATIONS:  Outpatient Encounter Medications as of 03/17/2019  Medication Sig  . acetaminophen (TYLENOL) 325 MG tablet Take 650 mg by mouth every 4 (four) hours as needed. for pain/ increased temp. May be administered orally, per G-tube if needed or rectally if unable to swallow (separate order). Maximum dose for 24 hours is 3,000 mg from all sources of Acetaminophen/ Tylenol  . Amino Acids-Protein Hydrolys (FEEDING SUPPLEMENT, PRO-STAT SUGAR FREE 64,) LIQD Take 30 mLs by mouth 2 (two) times daily between meals.  Marland Kitchen apixaban (ELIQUIS) 2.5 MG TABS tablet Take 1 tablet (2.5 mg total) by mouth 2 (two) times daily.  Marland Kitchen atorvastatin (LIPITOR) 80 MG tablet Take 80 mg by mouth every evening.   . carvedilol (COREG) 6.25 MG tablet Take 1 tablet (6.25 mg total) by mouth 2 (two) times daily.  Marland Kitchen diltiazem (CARDIZEM CD) 120 MG 24 hr capsule Take 120 mg by mouth every evening.   . docusate sodium (COLACE) 100 MG capsule Take 100 mg by mouth 2 (two) times daily.   . ferrous sulfate 325 (65 FE) MG tablet Take 1 tablet (325 mg total) by mouth 2 (two) times daily with a meal.  . food thickener (THICK IT) POWD Take by mouth. Use to mix thin liquids to nectar thick consistency as needed  . Infant Care Products (DERMACLOUD) CREA Apply liberal amount topically to area of skin irritation prn. OK to leave at bedside.  . Multiple Vitamin (MULTIVITAMIN WITH MINERALS) TABS tablet Take 1 tablet by mouth daily.  . mupirocin  cream (BACTROBAN) 2 % Apply thin film topically to open areas on penis 2 times a day until healed  . Nutritional Supplements (ENSURE ENLIVE PO) Take 1 Bottle by mouth 3 (three) times daily. For 8 oz bottle, add 2 tablespoons of thickner. Stir for 15 seconds until dissolved.  . Probiotic Product (RISA-BID PROBIOTIC) TABS Take 1 tablet by mouth 2 (two) times daily.  . sacubitril-valsartan (ENTRESTO) 49-51 MG Take 1 tablet by mouth 2 (two) times daily.   No facility-administered encounter medications on file as of 03/17/2019.    PHYSICAL EXAM:   Deferred Wanda Rideout Z Aashritha Miedema, NP

## 2019-05-05 ENCOUNTER — Other Ambulatory Visit: Payer: Medicare HMO | Admitting: Nurse Practitioner

## 2019-05-05 ENCOUNTER — Encounter: Payer: Self-pay | Admitting: Nurse Practitioner

## 2019-05-05 ENCOUNTER — Other Ambulatory Visit: Payer: Self-pay

## 2019-05-05 DIAGNOSIS — Z515 Encounter for palliative care: Secondary | ICD-10-CM

## 2019-05-05 NOTE — Progress Notes (Signed)
Therapist, nutritional Palliative Care Consult Note Telephone: [phone]  Fax: [phone]  PATIENT NAME: Jordan Kline DOB: 1928-01-15 MRN: [id number]  PRIMARY CARE PROVIDER:   Sherron Monday, MD  REFERRING PROVIDER:  Sherron Monday, MD [address] [place],  [place] [address] RESPONSIBLE PARTY:RESPONSIBLE PARTY:Jordan Kline [phone]  Due to the COVID-19 crisis, this visit was done via telemedicine from my office and it was initiated and consent by this patient and or family.  RECOMMENDATIONS and PLAN: 1.ACP: DNR in place with GOC to focus on comfort  2.Debility secondary to late onset CVA progressive, encourage passive rom;  3.Palliative care encounter;Palliative medicine team will continue to support patient, patient's family, and medical team. Visit consisted of counseling and education dealing with the complex and emotionally intense issues of symptom management and palliative care in the setting of serious and potentially life-threatening illness  Palliative care 9/27 / 2019 Palliative care 1/ 25 / 2019 to 2 / 15 / 2019 Hospice 2 / 17 / 2019 to 8 / 15 / 2019  I spent 35 minutes providing this consultation,  from 1:00pm to 1:35pm. More than 50% of the time in this consultation was spent coordinating communication.   HISTORY OF PRESENT ILLNESS:  Jordan Kline is a 86 y.o. year old male with multiple medical problems including Left-sided hemiparesis, atrial fibrillation, chronic kidney disease, hypertension, hyperlipidemia, bph, history of GI bleed, protein calorie malnutrition, inguinal hernia, appendectomy. I called Jordan Kline for schedule palliative care telemedicine, telephone I guess video not available follow up visit for Jordan Kline. Jordan Kline in agreement. Jordan Kline is cognitively impaired, functionally also. Jordan Kline continues to live at home with his Jordan who is his primary caregiver. Jordan Kline  endorses new recent hospitalizations, Falls common infections. Jordan Kline does tried to manage his care strictly at home. Jordan Kline endorses she has had no further problems with the Foley catheter. Jordan. Kline would like to have an extra catheter at hand just so he wouldn't have to go to the emergency department should it become dislodged. We talked about chronic disease progression. We talked about medical goals of care. We talked about follow-up palliative care visit in two months if needed or sooner should he declined. Jordan Kline in agreement. Appointment scheduled. Therapeutic listening and emotional support provided. Contact information  Palliative Care was asked to help address goals of care.   CODE STATUS: DNR  PPS: 30% HOSPICE ELIGIBILITY/DIAGNOSIS: TBD  PAST MEDICAL HISTORY:  Past Medical History:  Diagnosis Date  . A-fib (HCC)    On Xarelto  . Chronic kidney disease (CKD)   . High cholesterol   . Hypertension   . Inguinal hernia   . Stroke [facility])    With left-sided hemiparesis  . Urinary retention    Chronic Foley catheter since December 2016    SOCIAL HX:  Social History   Tobacco Use  . Smoking status: Former Games developer  . Smokeless tobacco: Never Used  . Tobacco comment: quit 50 years  Substance Use Topics  . Alcohol use: No    Alcohol/week: 0.0 standard drinks    ALLERGIES: No Known Allergies   PERTINENT MEDICATIONS:  Outpatient Encounter Medications as of 05/05/2019  Medication Sig  . acetaminophen (TYLENOL) 325 MG tablet Take 650 mg by mouth every 4 (four) hours as needed. for pain/ increased temp. May be administered orally, per G-tube if needed or rectally if unable to swallow (separate order). Maximum dose for 24 hours is 3,000  mg from all sources of Acetaminophen/ Tylenol  . Amino Acids-Protein Hydrolys (FEEDING SUPPLEMENT, PRO-STAT SUGAR FREE 64,) LIQD Take 30 mLs by mouth 2 (two) times daily between meals.  Marland Kitchen apixaban (ELIQUIS) 2.5 MG TABS tablet Take  1 tablet (2.5 mg total) by mouth 2 (two) times daily.  Marland Kitchen atorvastatin (LIPITOR) 80 MG tablet Take 80 mg by mouth every evening.   . carvedilol (COREG) 6.25 MG tablet Take 1 tablet (6.25 mg total) by mouth 2 (two) times daily.  Marland Kitchen diltiazem (CARDIZEM CD) 120 MG 24 hr capsule Take 120 mg by mouth every evening.   . docusate sodium (COLACE) 100 MG capsule Take 100 mg by mouth 2 (two) times daily.   . ferrous sulfate 325 (65 FE) MG tablet Take 1 tablet (325 mg total) by mouth 2 (two) times daily with a meal.  . food thickener (THICK IT) POWD Take by mouth. Use to mix thin liquids to nectar thick consistency as needed  . Infant Care Products (DERMACLOUD) CREA Apply liberal amount topically to area of skin irritation prn. OK to leave at bedside.  . Multiple Vitamin (MULTIVITAMIN WITH MINERALS) TABS tablet Take 1 tablet by mouth daily.  . mupirocin cream (BACTROBAN) 2 % Apply thin film topically to open areas on penis 2 times a day until healed  . Nutritional Supplements (ENSURE ENLIVE PO) Take 1 Bottle by mouth 3 (three) times daily. For 8 oz bottle, add 2 tablespoons of thickner. Stir for 15 seconds until dissolved.  . Probiotic Product (RISA-BID PROBIOTIC) TABS Take 1 tablet by mouth 2 (two) times daily.  . sacubitril-valsartan (ENTRESTO) 49-51 MG Take 1 tablet by mouth 2 (two) times daily.   No facility-administered encounter medications on file as of 05/05/2019.    PHYSICAL EXAM:  Deferred  Tamura Lasky Z Shanikqua Zarzycki, NP

## 2019-05-10 ENCOUNTER — Emergency Department: Payer: Medicare HMO

## 2019-05-10 ENCOUNTER — Inpatient Hospital Stay
Admission: EM | Admit: 2019-05-10 | Discharge: 2019-05-19 | DRG: 871 | Disposition: A | Discharge status: Home Health | Payer: Medicare HMO | Attending: Internal Medicine | Admitting: Internal Medicine

## 2019-05-10 ENCOUNTER — Other Ambulatory Visit: Payer: Self-pay

## 2019-05-10 DIAGNOSIS — L89319 Pressure ulcer of right buttock, unspecified stage: Secondary | ICD-10-CM | POA: Diagnosis present

## 2019-05-10 DIAGNOSIS — R4182 Altered mental status, unspecified: Secondary | ICD-10-CM

## 2019-05-10 DIAGNOSIS — R6520 Severe sepsis without septic shock: Secondary | ICD-10-CM | POA: Diagnosis present

## 2019-05-10 DIAGNOSIS — I509 Heart failure, unspecified: Secondary | ICD-10-CM

## 2019-05-10 DIAGNOSIS — N179 Acute kidney failure, unspecified: Secondary | ICD-10-CM | POA: Diagnosis present

## 2019-05-10 DIAGNOSIS — R52 Pain, unspecified: Secondary | ICD-10-CM

## 2019-05-10 DIAGNOSIS — A419 Sepsis, unspecified organism: Secondary | ICD-10-CM | POA: Diagnosis not present

## 2019-05-10 DIAGNOSIS — B9689 Other specified bacterial agents as the cause of diseases classified elsewhere: Secondary | ICD-10-CM | POA: Diagnosis present

## 2019-05-10 DIAGNOSIS — E43 Unspecified severe protein-calorie malnutrition: Secondary | ICD-10-CM | POA: Diagnosis present

## 2019-05-10 DIAGNOSIS — E785 Hyperlipidemia, unspecified: Secondary | ICD-10-CM | POA: Diagnosis present

## 2019-05-10 DIAGNOSIS — I361 Nonrheumatic tricuspid (valve) insufficiency: Secondary | ICD-10-CM | POA: Diagnosis not present

## 2019-05-10 DIAGNOSIS — A4151 Sepsis due to Escherichia coli [E. coli]: Secondary | ICD-10-CM | POA: Diagnosis present

## 2019-05-10 DIAGNOSIS — Z681 Body mass index (BMI) 19 or less, adult: Secondary | ICD-10-CM | POA: Diagnosis not present

## 2019-05-10 DIAGNOSIS — B9620 Unspecified Escherichia coli [E. coli] as the cause of diseases classified elsewhere: Secondary | ICD-10-CM | POA: Diagnosis present

## 2019-05-10 DIAGNOSIS — I69354 Hemiplegia and hemiparesis following cerebral infarction affecting left non-dominant side: Secondary | ICD-10-CM

## 2019-05-10 DIAGNOSIS — D62 Acute posthemorrhagic anemia: Secondary | ICD-10-CM | POA: Diagnosis present

## 2019-05-10 DIAGNOSIS — K922 Gastrointestinal hemorrhage, unspecified: Secondary | ICD-10-CM | POA: Diagnosis present

## 2019-05-10 DIAGNOSIS — E878 Other disorders of electrolyte and fluid balance, not elsewhere classified: Secondary | ICD-10-CM | POA: Diagnosis present

## 2019-05-10 DIAGNOSIS — L89311 Pressure ulcer of right buttock, stage 1: Secondary | ICD-10-CM | POA: Diagnosis present

## 2019-05-10 DIAGNOSIS — N390 Urinary tract infection, site not specified: Secondary | ICD-10-CM | POA: Diagnosis present

## 2019-05-10 DIAGNOSIS — I4891 Unspecified atrial fibrillation: Secondary | ICD-10-CM | POA: Diagnosis present

## 2019-05-10 DIAGNOSIS — E870 Hyperosmolality and hypernatremia: Secondary | ICD-10-CM | POA: Diagnosis not present

## 2019-05-10 DIAGNOSIS — I351 Nonrheumatic aortic (valve) insufficiency: Secondary | ICD-10-CM | POA: Diagnosis not present

## 2019-05-10 DIAGNOSIS — I5032 Chronic diastolic (congestive) heart failure: Secondary | ICD-10-CM | POA: Diagnosis present

## 2019-05-10 DIAGNOSIS — L89329 Pressure ulcer of left buttock, unspecified stage: Secondary | ICD-10-CM | POA: Diagnosis present

## 2019-05-10 DIAGNOSIS — N1832 Chronic kidney disease, stage 3b: Secondary | ICD-10-CM | POA: Diagnosis present

## 2019-05-10 DIAGNOSIS — R0902 Hypoxemia: Secondary | ICD-10-CM

## 2019-05-10 DIAGNOSIS — D649 Anemia, unspecified: Secondary | ICD-10-CM

## 2019-05-10 DIAGNOSIS — Z20822 Contact with and (suspected) exposure to covid-19: Secondary | ICD-10-CM | POA: Diagnosis present

## 2019-05-10 DIAGNOSIS — L8990 Pressure ulcer of unspecified site, unspecified stage: Secondary | ICD-10-CM | POA: Insufficient documentation

## 2019-05-10 DIAGNOSIS — Z515 Encounter for palliative care: Secondary | ICD-10-CM

## 2019-05-10 DIAGNOSIS — K921 Melena: Secondary | ICD-10-CM | POA: Diagnosis present

## 2019-05-10 DIAGNOSIS — G9341 Metabolic encephalopathy: Secondary | ICD-10-CM

## 2019-05-10 DIAGNOSIS — E7800 Pure hypercholesterolemia, unspecified: Secondary | ICD-10-CM | POA: Diagnosis present

## 2019-05-10 DIAGNOSIS — Z7189 Other specified counseling: Secondary | ICD-10-CM | POA: Diagnosis not present

## 2019-05-10 DIAGNOSIS — A4150 Gram-negative sepsis, unspecified: Secondary | ICD-10-CM | POA: Diagnosis not present

## 2019-05-10 DIAGNOSIS — I480 Paroxysmal atrial fibrillation: Secondary | ICD-10-CM | POA: Diagnosis present

## 2019-05-10 DIAGNOSIS — I130 Hypertensive heart and chronic kidney disease with heart failure and stage 1 through stage 4 chronic kidney disease, or unspecified chronic kidney disease: Secondary | ICD-10-CM | POA: Diagnosis present

## 2019-05-10 DIAGNOSIS — R339 Retention of urine, unspecified: Secondary | ICD-10-CM | POA: Diagnosis present

## 2019-05-10 DIAGNOSIS — G92 Toxic encephalopathy: Secondary | ICD-10-CM | POA: Diagnosis not present

## 2019-05-10 DIAGNOSIS — L89321 Pressure ulcer of left buttock, stage 1: Secondary | ICD-10-CM | POA: Diagnosis present

## 2019-05-10 DIAGNOSIS — R062 Wheezing: Secondary | ICD-10-CM

## 2019-05-10 DIAGNOSIS — I472 Ventricular tachycardia: Secondary | ICD-10-CM | POA: Diagnosis present

## 2019-05-10 DIAGNOSIS — Z79899 Other long term (current) drug therapy: Secondary | ICD-10-CM

## 2019-05-10 DIAGNOSIS — Z7401 Bed confinement status: Secondary | ICD-10-CM

## 2019-05-10 DIAGNOSIS — Z87440 Personal history of urinary (tract) infections: Secondary | ICD-10-CM

## 2019-05-10 DIAGNOSIS — Z66 Do not resuscitate: Secondary | ICD-10-CM | POA: Diagnosis present

## 2019-05-10 DIAGNOSIS — D509 Iron deficiency anemia, unspecified: Secondary | ICD-10-CM | POA: Diagnosis present

## 2019-05-10 DIAGNOSIS — E860 Dehydration: Secondary | ICD-10-CM | POA: Diagnosis present

## 2019-05-10 DIAGNOSIS — Z87891 Personal history of nicotine dependence: Secondary | ICD-10-CM

## 2019-05-10 DIAGNOSIS — D500 Iron deficiency anemia secondary to blood loss (chronic): Secondary | ICD-10-CM | POA: Diagnosis not present

## 2019-05-10 DIAGNOSIS — Z8249 Family history of ischemic heart disease and other diseases of the circulatory system: Secondary | ICD-10-CM

## 2019-05-10 DIAGNOSIS — E871 Hypo-osmolality and hyponatremia: Secondary | ICD-10-CM | POA: Diagnosis present

## 2019-05-10 DIAGNOSIS — Z7901 Long term (current) use of anticoagulants: Secondary | ICD-10-CM

## 2019-05-10 DIAGNOSIS — D696 Thrombocytopenia, unspecified: Secondary | ICD-10-CM | POA: Diagnosis present

## 2019-05-10 LAB — CBC WITH DIFFERENTIAL/PLATELET
Abs Immature Granulocytes: 0.1 10*3/uL — ABNORMAL HIGH (ref 0.00–0.07)
Basophils Absolute: 0 10*3/uL (ref 0.0–0.1)
Basophils Relative: 0 %
Eosinophils Absolute: 0 10*3/uL (ref 0.0–0.5)
Eosinophils Relative: 0 %
HCT: 23.4 % — ABNORMAL LOW (ref 39.0–52.0)
Hemoglobin: 7 g/dL — ABNORMAL LOW (ref 13.0–17.0)
Immature Granulocytes: 1 %
Lymphocytes Relative: 2 %
Lymphs Abs: 0.2 10*3/uL — ABNORMAL LOW (ref 0.7–4.0)
MCH: 32.1 pg (ref 26.0–34.0)
MCHC: 29.9 g/dL — ABNORMAL LOW (ref 30.0–36.0)
MCV: 107.3 fL — ABNORMAL HIGH (ref 80.0–100.0)
Monocytes Absolute: 0.3 10*3/uL (ref 0.1–1.0)
Monocytes Relative: 3 %
Neutro Abs: 11.6 10*3/uL — ABNORMAL HIGH (ref 1.7–7.7)
Neutrophils Relative %: 94 %
Platelets: 94 10*3/uL — ABNORMAL LOW (ref 150–400)
RBC: 2.18 MIL/uL — ABNORMAL LOW (ref 4.22–5.81)
RDW: 15.4 % (ref 11.5–15.5)
WBC Morphology: INCREASED
WBC: 12.3 10*3/uL — ABNORMAL HIGH (ref 4.0–10.5)
nRBC: 0 % (ref 0.0–0.2)

## 2019-05-10 LAB — RESPIRATORY PANEL BY RT PCR (FLU A&B, COVID)
Influenza A by PCR: NEGATIVE
Influenza B by PCR: NEGATIVE
SARS Coronavirus 2 by RT PCR: NEGATIVE

## 2019-05-10 LAB — URINALYSIS, COMPLETE (UACMP) WITH MICROSCOPIC
Bilirubin Urine: NEGATIVE
Glucose, UA: NEGATIVE mg/dL
Ketones, ur: NEGATIVE mg/dL
Nitrite: NEGATIVE
Protein, ur: 100 mg/dL — AB
RBC / HPF: >50 RBC/hpf — ABNORMAL HIGH (ref 0–5)
Specific Gravity, Urine: 1.014 (ref 1.005–1.030)
Squamous Epithelial / HPF: NONE SEEN (ref 0–5)
WBC, UA: >50 WBC/hpf — ABNORMAL HIGH (ref 0–5)
pH: 5 (ref 5.0–8.0)

## 2019-05-10 LAB — COMPREHENSIVE METABOLIC PANEL
ALT: 22 U/L (ref 0–44)
AST: 24 U/L (ref 15–41)
Albumin: 2.6 g/dL — ABNORMAL LOW (ref 3.5–5.0)
Alkaline Phosphatase: 133 U/L — ABNORMAL HIGH (ref 38–126)
Anion gap: 13 (ref 5–15)
BUN: 122 mg/dL — ABNORMAL HIGH (ref 8–23)
CO2: 18 mmol/L — ABNORMAL LOW (ref 22–32)
Calcium: 8.5 mg/dL — ABNORMAL LOW (ref 8.9–10.3)
Chloride: 111 mmol/L (ref 98–111)
Creatinine, Ser: 4.44 mg/dL — ABNORMAL HIGH (ref 0.61–1.24)
GFR calc Af Amer: 13 mL/min — ABNORMAL LOW (ref >60)
GFR calc non Af Amer: 11 mL/min — ABNORMAL LOW (ref >60)
Glucose, Bld: 110 mg/dL — ABNORMAL HIGH (ref 70–99)
Potassium: 3.8 mmol/L (ref 3.5–5.1)
Sodium: 142 mmol/L (ref 135–145)
Total Bilirubin: 0.8 mg/dL (ref 0.3–1.2)
Total Protein: 5.6 g/dL — ABNORMAL LOW (ref 6.5–8.1)

## 2019-05-10 LAB — LACTIC ACID, PLASMA: Lactic Acid, Venous: 2 mmol/L (ref 0.5–1.9)

## 2019-05-10 LAB — PROTIME-INR
INR: 1.9 — ABNORMAL HIGH (ref 0.8–1.2)
Prothrombin Time: 21.4 seconds — ABNORMAL HIGH (ref 11.4–15.2)

## 2019-05-10 MED ORDER — SODIUM CHLORIDE 0.9 % IV BOLUS
1000.0000 mL | Freq: Once | INTRAVENOUS | Status: AC
  Administered 2019-05-10: 1000 mL via INTRAVENOUS

## 2019-05-10 MED ORDER — DIGOXIN 0.25 MG/ML IJ SOLN
0.2500 mg | Freq: Once | INTRAMUSCULAR | Status: AC
  Administered 2019-05-11: 0.25 mg via INTRAVENOUS
  Filled 2019-05-10: qty 2

## 2019-05-10 MED ORDER — ONDANSETRON HCL 4 MG/2ML IJ SOLN: 4.0000 mg | Freq: Four times a day (QID) | INTRAMUSCULAR | Status: DC | PRN

## 2019-05-10 MED ORDER — RISAQUAD PO CAPS
1.0000 | ORAL_CAPSULE | Freq: Two times a day (BID) | ORAL | Status: DC
  Administered 2019-05-11 – 2019-05-19 (×17): 1 via ORAL
  Filled 2019-05-10 (×19): qty 1

## 2019-05-10 MED ORDER — PANTOPRAZOLE SODIUM 40 MG IV SOLR: 40.0000 mg | Freq: Two times a day (BID) | INTRAVENOUS | Status: DC

## 2019-05-10 MED ORDER — ONDANSETRON HCL 4 MG PO TABS: 4.0000 mg | ORAL_TABLET | Freq: Four times a day (QID) | ORAL | Status: DC | PRN

## 2019-05-10 MED ORDER — ADULT MULTIVITAMIN W/MINERALS CH
1.0000 | ORAL_TABLET | Freq: Every day | ORAL | Status: DC
  Administered 2019-05-11 – 2019-05-19 (×9): 1 via ORAL
  Filled 2019-05-10 (×9): qty 1

## 2019-05-10 MED ORDER — ACETAMINOPHEN 325 MG PO TABS
650.0000 mg | ORAL_TABLET | Freq: Four times a day (QID) | ORAL | Status: DC | PRN
  Administered 2019-05-12: 650 mg via ORAL
  Filled 2019-05-10: qty 2

## 2019-05-10 MED ORDER — MAGNESIUM HYDROXIDE 400 MG/5ML PO SUSP: 30.0000 mL | Freq: Every day | ORAL | Status: DC | PRN

## 2019-05-10 MED ORDER — SODIUM CHLORIDE 0.9 % IV SOLN: INTRAVENOUS | Status: DC

## 2019-05-10 MED ORDER — DILTIAZEM HCL ER COATED BEADS 120 MG PO CP24
120.0000 mg | ORAL_CAPSULE | Freq: Every evening | ORAL | Status: DC
  Administered 2019-05-11 – 2019-05-13 (×3): 120 mg via ORAL
  Filled 2019-05-10 (×3): qty 1

## 2019-05-10 MED ORDER — PANTOPRAZOLE SODIUM 40 MG IV SOLR
40.0000 mg | INTRAVENOUS | Status: AC
  Filled 2019-05-10 (×2): qty 40

## 2019-05-10 MED ORDER — TRAZODONE HCL 50 MG PO TABS: 25.0000 mg | ORAL_TABLET | Freq: Every evening | ORAL | Status: DC | PRN

## 2019-05-10 MED ORDER — SODIUM CHLORIDE 0.9 % IV SOLN: 2.0000 g | INTRAVENOUS | Status: DC

## 2019-05-10 MED ORDER — FERROUS SULFATE 325 (65 FE) MG PO TABS
325.0000 mg | ORAL_TABLET | Freq: Two times a day (BID) | ORAL | Status: DC
  Administered 2019-05-11: 325 mg via ORAL
  Filled 2019-05-10 (×3): qty 1

## 2019-05-10 MED ORDER — PANTOPRAZOLE SODIUM 40 MG IV SOLR
40.0000 mg | INTRAVENOUS | Status: AC
  Administered 2019-05-10 (×2): 40 mg via INTRAVENOUS

## 2019-05-10 MED ORDER — SODIUM CHLORIDE 0.9 % IV SOLN
2.0000 g | Freq: Once | INTRAVENOUS | Status: AC
  Administered 2019-05-10: 2 g via INTRAVENOUS

## 2019-05-10 MED ORDER — CARVEDILOL 6.25 MG PO TABS: 6.2500 mg | ORAL_TABLET | Freq: Two times a day (BID) | ORAL | Status: DC

## 2019-05-10 MED ORDER — ATORVASTATIN CALCIUM 20 MG PO TABS
80.0000 mg | ORAL_TABLET | Freq: Every evening | ORAL | Status: DC
  Administered 2019-05-11 – 2019-05-18 (×8): 80 mg via ORAL
  Filled 2019-05-10 (×8): qty 4

## 2019-05-10 MED ORDER — SODIUM CHLORIDE 0.9 % IV SOLN
8.0000 mg/h | INTRAVENOUS | Status: AC
  Administered 2019-05-11 – 2019-05-13 (×6): 8 mg/h via INTRAVENOUS
  Filled 2019-05-10 (×7): qty 80

## 2019-05-10 MED ORDER — ACETAMINOPHEN 650 MG RE SUPP: 650.0000 mg | Freq: Four times a day (QID) | RECTAL | Status: DC | PRN

## 2019-05-10 MED ORDER — SODIUM CHLORIDE 0.9 % IV SOLN: 10.0000 mL/h | Freq: Once | INTRAVENOUS | Status: DC

## 2019-05-10 MED ORDER — SODIUM CHLORIDE 0.9 % IV SOLN: 8.0000 mg/h | INTRAVENOUS | Status: DC

## 2019-05-10 NOTE — ED Provider Notes (Signed)
[facility] Emergency Department Provider Note   ____________________________________________   I have reviewed the triage vital signs and the nursing notes.   HISTORY  Chief Complaint Altered Mental Status   History limited by and level 5 caveat due to: AMS  HPI Jordan Kline is a 86 y.o. male who presents to the emergency department today via EMS from home because of concerns for altered mental status.  The patient himself cannot give any significant history.  Per report wife has noticed that the patient has been more altered than normal.  Patient did have a indwelling catheter replaced a few days ago.  Apparently the patient has had recurrent UTIs and has had similar presentations with previous UTIs.   Records reviewed. Per medical record review patient has a history of UTI  Past Medical History:  Diagnosis Date  . A-fib (HCC)    On Xarelto  . Chronic kidney disease (CKD)   . High cholesterol   . Hypertension   . Inguinal hernia   . Stroke [facility])    With left-sided hemiparesis  . Urinary retention    Chronic Foley catheter since December 2016    Patient Active Problem List   Diagnosis Date Noted  . Anorexia 07/08/2018  . Debility 07/08/2018  . Clostridium difficile colitis 05/29/2017  . Skin erosion 05/29/2017  . Knee effusion, right 05/29/2017  . Goals of care, counseling/discussion   . Palliative care encounter   . Sepsis (HCC) 04/08/2017  . Protein-calorie malnutrition, severe 11/09/2015  . GIB (gastrointestinal bleeding) 11/08/2015  . Recurrent UTI 07/12/2015  . UTI (lower urinary tract infection) 03/01/2015  . Urinary retention 03/01/2015    Past Surgical History:  Procedure Laterality Date  . APPENDECTOMY    . PERIPHERAL VASCULAR CATHETERIZATION N/A 11/10/2015   Procedure: Visceral Angiography, mesenteric angio;  Surgeon: Annice Needy, MD;  Location: [facility] INVASIVE CV LAB;  Service: Cardiovascular;  Laterality: N/A;  .  PERIPHERAL VASCULAR CATHETERIZATION N/A 11/10/2015   Procedure: Visceral Artery Intervention;  Surgeon: Annice Needy, MD;  Location: [facility] INVASIVE CV LAB;  Service: Cardiovascular;  Laterality: N/A;    Prior to Admission medications   Medication Sig Start Date End Date Taking? Authorizing Provider  acetaminophen (TYLENOL) 325 MG tablet Take 650 mg by mouth every 4 (four) hours as needed. for pain/ increased temp. May be administered orally, per G-tube if needed or rectally if unable to swallow (separate order). Maximum dose for 24 hours is 3,000 mg from all sources of Acetaminophen/ Tylenol    [provider]  Amino Acids-Protein Hydrolys (FEEDING SUPPLEMENT, PRO-STAT SUGAR FREE 64,) LIQD Take 30 mLs by mouth 2 (two) times daily between meals.    [provider]  apixaban (ELIQUIS) 2.5 MG TABS tablet Take 1 tablet (2.5 mg total) by mouth 2 (two) times daily. 04/16/17   Alford Highland, MD  atorvastatin (LIPITOR) 80 MG tablet Take 80 mg by mouth every evening.     [provider]  carvedilol (COREG) 6.25 MG tablet Take 1 tablet (6.25 mg total) by mouth 2 (two) times daily. 04/16/17   Alford Highland, MD  diltiazem (CARDIZEM CD) 120 MG 24 hr capsule Take 120 mg by mouth every evening.     [provider]  docusate sodium (COLACE) 100 MG capsule Take 100 mg by mouth 2 (two) times daily.     [provider]  ferrous sulfate 325 (65 FE) MG tablet Take 1 tablet (325 mg total) by mouth 2 (two) times daily  with a meal. 03/04/15   Auburn Bilberry, MD  food thickener (THICK IT) POWD Take by mouth. Use to mix thin liquids to nectar thick consistency as needed    [provider]  Infant Care Products (DERMACLOUD) CREA Apply liberal amount topically to area of skin irritation prn. OK to leave at bedside.    [provider]  Multiple Vitamin (MULTIVITAMIN WITH MINERALS) TABS tablet Take 1 tablet by mouth daily.    [provider]  mupirocin  cream (BACTROBAN) 2 % Apply thin film topically to open areas on penis 2 times a day until healed    [provider]  Nutritional Supplements (ENSURE ENLIVE PO) Take 1 Bottle by mouth 3 (three) times daily. For 8 oz bottle, add 2 tablespoons of thickner. Stir for 15 seconds until dissolved.    [provider]  Probiotic Product (RISA-BID PROBIOTIC) TABS Take 1 tablet by mouth 2 (two) times daily.    [provider]  sacubitril-valsartan (ENTRESTO) 49-51 MG Take 1 tablet by mouth 2 (two) times daily. 04/16/17   Alford Highland, MD    Allergies Patient has no known allergies.  Family History  Problem Relation Age of Onset  . Hypertension Father   . Kidney cancer Neg Hx   . Kidney disease Neg Hx   . Prostate cancer Neg Hx     Social History Social History   Tobacco Use  . Smoking status: Former Games developer  . Smokeless tobacco: Never Used  . Tobacco comment: quit 50 years  Substance Use Topics  . Alcohol use: No    Alcohol/week: 0.0 standard drinks  . Drug use: No    Review of Systems Unable to obtain secondary to altered mental status ____________________________________________   PHYSICAL EXAM:  VITAL SIGNS: ED Triage Vitals  Enc Vitals Group     BP 05/10/19 2123 (!) 78/54     Pulse Rate 05/10/19 2123 (!) 117     Resp 05/10/19 2123 (!) 33     Temp 05/10/19 2123 98.7 F (37.1 C)     Temp Source 05/10/19 2123 Axillary     SpO2 05/10/19 2123 100 %     Weight 05/10/19 2130 130 lb (59 kg)     Height 05/10/19 2130 5\' 8"  (1.727 m)   Constitutional: Awake and alert.  Eyes: Conjunctivae are normal.  ENT      Head: Normocephalic and atraumatic.      Nose: No congestion/rhinnorhea.      Mouth/Throat: Mucous membranes are moist.      Neck: No stridor. Hematological/Lymphatic/Immunilogical: No cervical lymphadenopathy. Cardiovascular: Tachycardic, regular rhythm.  No murmurs, rubs, or gallops.  Respiratory: Tachypnea. Slightly increased respiratory  effort.  Gastrointestinal: Soft and non tender. No rebound. No guarding.  Rectal: Melenotic stool, guiac positive Musculoskeletal: No lower extremity edema. Neurologic:  Awake and alert. Attempts to respond to verbal stimuli with some muffled speech. Left upper extremity held in contraction. Skin:  Skin is warm, dry and intact. No rash noted.  ____________________________________________    LABS (pertinent positives/negatives)  Lactic 2.0 CBC wbc 12.3, hgb 7.0, plt 94 CMP na 142, k 3.8, cr 4.44 UA leukocytes large, >50 rbc, >50 wbc, many bacteria ____________________________________________   EKG  I, , attending physician, personally viewed and interpreted this EKG  EKG Time: 2127 Rate: 126 Rhythm: atrial fibrillation Axis: normal Intervals: qtc 513 QRS: narrow, q waves v1 ST changes: no st elevation Impression: abnormal ekg   ____________________________________________    RADIOLOGY  CXR No  acute abnormality  ____________________________________________   PROCEDURES  Procedures  CRITICAL CARE Performed by: Nance Pear   Total critical care time: 45 minutes  Critical care time was exclusive of separately billable procedures and treating other patients.  Critical care was necessary to treat or prevent imminent or life-threatening deterioration.  Critical care was time spent personally by me on the following activities: development of treatment plan with patient and/or surrogate as well as nursing, discussions with consultants, evaluation of patient's response to treatment, examination of patient, obtaining history from patient or surrogate, ordering and performing treatments and interventions, ordering and review of laboratory studies, ordering and review of radiographic studies, pulse oximetry and re-evaluation of patient's condition.  ____________________________________________   INITIAL IMPRESSION / ASSESSMENT AND PLAN / ED  COURSE  Pertinent labs & imaging results that were available during my care of the patient were reviewed by me and considered in my medical decision making (see chart for details).   Patient presented from home today because of concerns for altered mental status.  Initial vital signs were concerning for hypotension as well as tachycardia.  Did have concerns for sepsis.  Broad work-up was initiated.  Blood work was consistent with sepsis with elevated lactic acid level.  Also evidence of endorgan damage with elevated creatinine.  Patient's urine consistent with UTI.  Patient was also found to be anemic.  Patient had grossly melanotic stool on glove at time of rectal exam.  Will start patient on broad-spectrum antibiotics, IV fluids as well as Protonix.  Will write for blood transfusion.  Discussed with hospitalist for admission.   ____________________________________________   FINAL CLINICAL IMPRESSION(S) / ED DIAGNOSES  Final diagnoses:  Altered mental status, unspecified altered mental status type  Sepsis, due to unspecified organism, unspecified whether acute organ dysfunction present [facility])  Lower urinary tract infectious disease  AKI (acute kidney injury) ([place])  Anemia, unspecified type  Gastrointestinal hemorrhage with melena     Note: This dictation was prepared with Dragon dictation. Any transcriptional errors that result from this process are unintentional     Nance Pear, MD 05/10/19 2256

## 2019-05-10 NOTE — ED Notes (Signed)
Called Mrs Cashman and gave full update on her husbands status. She indicated that she would accept updates throughout the night if necessary. This nurse will update her with significant changes in status or when he is assigned a room.

## 2019-05-10 NOTE — H&P (Addendum)
Progress at [facility]   PATIENT NAME: Jordan Kline    MR#:  [id number]  DATE OF BIRTH:  06-19-1927  DATE OF ADMISSION:  05/10/2019  PRIMARY CARE PHYSICIAN: Sherron Monday, MD   REQUESTING/REFERRING PHYSICIAN: Phineas Semen, MD CHIEF COMPLAINT:   Chief Complaint  Patient presents with  . Altered Mental Status    HISTORY OF PRESENT ILLNESS:  Savannah Erbe  is a 86 y.o. African-American male with a known history of hypertension, dyslipidemia, CVA and chronic indwelling Foley catheter, as well as atrial fibrillation, who presented to the emergency room with acute onset of altered mental status.  Per his wife he had his indwelling Foley catheter replaced a few days ago and was noted to be altered more than usual since yesterday.  He had similar presentation with previous UTIs.  No reported nausea or vomiting or abdominal pain.  He was noted to have melena in the ER.  No reported fever or chills.  The patient stated that he was not feeling too good but otherwise has not answered any other questions.  Upon presentation to the emergency room, vital signs revealed blood pressure of 78/53 with a pulse of 117 and respirate 33 with pulse oximetry 100% on room air attention 98.7.  Labs revealed BUN of 122 with a creatinine of 4.44 compared to 66 and 1.58, lactic acid of 2 and CBC with leukocytosis 12.3 with neutrophilia as well as anemia with hemoglobin of 7 hematocrit 23.4 compared to 10 and 29.2 in January 2019.  INR is 1.9 with PTT 21.4.  Urinalysis was strongly positive for UTI.  The patient had grossly melanotic stool upon rectal exam by ER physician with positive stool Hemoccult. Blood and urine cultures were drawn.  Portable chest x-ray showed mild cardiomegaly with no acute cardiopulmonary disease.  EKG showed atrial fibrillation with rapid ventricular response of 126 with mild ST segment depression in inferior leads.  The patient was given 2 g of IV cefepime as well  as IV Protonix bolus and drip in addition to hydration with normal saline for 3 L bolus.  Blood pressure improved to 112/98.  He will be admitted to a stepdown unit for further evaluation and management. PAST MEDICAL HISTORY:   Past Medical History:  Diagnosis Date  . A-fib (HCC)    On Xarelto  . Chronic kidney disease (CKD)   . High cholesterol   . Hypertension   . Inguinal hernia   . Stroke [facility])    With left-sided hemiparesis  . Urinary retention    Chronic Foley catheter since December 2016    PAST SURGICAL HISTORY:   Past Surgical History:  Procedure Laterality Date  . APPENDECTOMY    . PERIPHERAL VASCULAR CATHETERIZATION N/A 11/10/2015   Procedure: Visceral Angiography, mesenteric angio;  Surgeon: Annice Needy, MD;  Location: [facility] INVASIVE CV LAB;  Service: Cardiovascular;  Laterality: N/A;  . PERIPHERAL VASCULAR CATHETERIZATION N/A 11/10/2015   Procedure: Visceral Artery Intervention;  Surgeon: Annice Needy, MD;  Location: [facility] INVASIVE CV LAB;  Service: Cardiovascular;  Laterality: N/A;    SOCIAL HISTORY:   Social History   Tobacco Use  . Smoking status: Former Games developer  . Smokeless tobacco: Never Used  . Tobacco comment: quit 50 years  Substance Use Topics  . Alcohol use: No    Alcohol/week: 0.0 standard drinks    FAMILY HISTORY:   Family History  Problem Relation Age of Onset  . Hypertension Father   . Kidney cancer  Neg Hx   . Kidney disease Neg Hx   . Prostate cancer Neg Hx     DRUG ALLERGIES:  No Known Allergies  REVIEW OF SYSTEMS:   ROS As per history of present illness. All pertinent systems were reviewed above. Constitutional,  HEENT, cardiovascular, respiratory, GI, GU, musculoskeletal, neuro, psychiatric, endocrine,  integumentary and hematologic systems were reviewed and are otherwise  negative/unremarkable except for positive findings mentioned above in the HPI.   MEDICATIONS AT HOME:   Prior to Admission medications   Medication Sig  Start Date End Date Taking? Authorizing Provider  acetaminophen (TYLENOL) 325 MG tablet Take 650 mg by mouth every 4 (four) hours as needed. for pain/ increased temp. May be administered orally, per G-tube if needed or rectally if unable to swallow (separate order). Maximum dose for 24 hours is 3,000 mg from all sources of Acetaminophen/ Tylenol   Yes [provider]  Amino Acids-Protein Hydrolys (FEEDING SUPPLEMENT, PRO-STAT SUGAR FREE 64,) LIQD Take 30 mLs by mouth 2 (two) times daily between meals.   Yes [provider]  apixaban (ELIQUIS) 2.5 MG TABS tablet Take 1 tablet (2.5 mg total) by mouth 2 (two) times daily. 04/16/17  Yes Wieting, Richard, MD  atorvastatin (LIPITOR) 80 MG tablet Take 80 mg by mouth every evening.    Yes [provider]  carvedilol (COREG) 6.25 MG tablet Take 1 tablet (6.25 mg total) by mouth 2 (two) times daily. 04/16/17  Yes Wieting, Richard, MD  diltiazem (CARDIZEM CD) 120 MG 24 hr capsule Take 120 mg by mouth every evening.    Yes [provider]  docusate sodium (COLACE) 100 MG capsule Take 100 mg by mouth 2 (two) times daily.    Yes [provider]  ferrous sulfate 325 (65 FE) MG tablet Take 1 tablet (325 mg total) by mouth 2 (two) times daily with a meal. 03/04/15  Yes Dustin Flock, MD  food thickener (THICK IT) POWD Take by mouth. Use to mix thin liquids to nectar thick consistency as needed   Yes [provider]  [place] (DERMACLOUD) CREA Apply liberal amount topically to area of skin irritation prn. OK to leave at bedside.   Yes [provider]  Multiple Vitamin (MULTIVITAMIN WITH MINERALS) TABS tablet Take 1 tablet by mouth daily.   Yes [provider]  mupirocin cream (BACTROBAN) 2 % Apply thin film topically to open areas on penis 2 times a day until healed   Yes [provider]  Nutritional Supplements (ENSURE ENLIVE PO) Take 1 Bottle by mouth 3 (three) times daily. For  8 oz bottle, add 2 tablespoons of thickner. Stir for 15 seconds until dissolved.   Yes [provider]  Probiotic Product (RISA-BID PROBIOTIC) TABS Take 1 tablet by mouth 2 (two) times daily.   Yes [provider]  sacubitril-valsartan (ENTRESTO) 49-51 MG Take 1 tablet by mouth 2 (two) times daily. 04/16/17  Yes Wieting, Richard, MD      VITAL SIGNS:  Blood pressure (!) 112/98, pulse (!) 112, temperature 98.7 F (37.1 C), temperature source Axillary, resp. rate (!) 28, height 5\' 8"  (1.727 m), weight 59 kg, SpO2 100 %.  PHYSICAL EXAMINATION:  Physical Exam  GENERAL:  86 y.o.-year-old African-American male patient lying in the bed with no acute distress.  EYES: Pupils equal, round, reactive to light and accommodation. No scleral icterus.  Positive pallor.  Extraocular muscles intact.  HEENT: Head atraumatic, normocephalic. Oropharynx and nasopharynx clear.  NECK:  Supple,  no jugular venous distention. No thyroid enlargement, no tenderness.  LUNGS: Normal breath sounds bilaterally, no wheezing, rales,rhonchi or crepitation. No use of accessory muscles of respiration.  CARDIOVASCULAR: Irregularly irregular tachycardic rhythm, S1, S2 normal. No murmurs, rubs, or gallops.  ABDOMEN: Soft, nondistended, nontender. Bowel sounds present. No organomegaly or mass.  EXTREMITIES: No pedal edema, cyanosis, or clubbing.  NEUROLOGIC: Cranial nerves II through XII are intact. Muscle strength 5/5 in all extremities. Sensation intact. Gait not checked.  PSYCHIATRIC: The patient is alert and oriented x 3.  Normal affect and good eye contact. SKIN: No obvious rash, lesion, or ulcer.   LABORATORY PANEL:   CBC Recent Labs  Lab 05/10/19 2136  WBC 12.3*  HGB 7.0*  HCT 23.4*  PLT 94*   ------------------------------------------------------------------------------------------------------------------  Chemistries  Recent Labs  Lab 05/10/19 2136  NA 142  K 3.8  CL 111  CO2 18*    GLUCOSE 110*  BUN 122*  CREATININE 4.44*  CALCIUM 8.5*  AST 24  ALT 22  ALKPHOS 133*  BILITOT 0.8   ------------------------------------------------------------------------------------------------------------------  Cardiac Enzymes No results for input(s): TROPONINI in the last 168 hours. ------------------------------------------------------------------------------------------------------------------  RADIOLOGY:  DG Chest 1 View  Result Date: 05/10/2019 CLINICAL DATA:  Altered mental status EXAM: CHEST  1 VIEW COMPARISON:  04/08/2017 FINDINGS: Mild cardiomegaly. No acute consolidation or effusion. Aortic atherosclerosis. No pneumothorax. IMPRESSION: No active disease.  Mild cardiomegaly. Electronically Signed   By: Jasmine Pang M.D.   On: 05/10/2019 22:00      IMPRESSION AND PLAN:   1.  Sepsis likely secondary to urinary tract infection.  At this time he has no severe sepsis or septic shock.  His hypotension has resolved with hydration.  If this recurs he may be developing septic shock.  The patient will be admitted to stepdown unit.  We will continue hydration with IV normal saline.  Continue antibiotic therapy with IV cefepime.  Will follow urine and blood cultures.  Antihypertensives will be held off pending improvement of his blood pressure.  With persistent hypotension we will attempt more fluid bolus and consider IV pressor therapy.  2.  GI bleeding with subsequent acute blood loss anemia.  Patient was typed and crossmatch will be transfused 2 units of packed red blood cells.  Will follow serial hemoglobins and hematocrits.  We will place the patient on IV Protonix drip after bolus given in the emergency room.  A GI consultation with Dr. Allegra Lai will be obtained in a.m.  I notified her regarding the consult.  We will stop Eliquis.  3.  Atrial fibrillation with rapid ventricular response.  The patient will be hydrated with IV normal saline as mentioned above.  We will utilize  digoxin.  With improvement of his blood pressure we will utilize IV Cardizem as needed and if persistently tachycardic with borderline blood pressure may consider IV amiodarone.  We will have to stop Eliquis given his GI bleeding.  4.  Metabolic encephalopathy.  This like secondary to #1.  Will monitor mental status with current management.  5.  Acute kidney injury superimposed on stage IIIb chronic kidney disease.  This is likely prerenal due to volume depletion and dehydration.  He will be hydrated with IV normal saline and will follow his BMP.  6.  Dyslipidemia.  Statin therapy will be resumed.  6.  DVT prophylaxis.  SCDs.  Medical prophylaxis currently contraindicated due to GI bleeding.   All the records are reviewed and case discussed with ED provider. The plan  of care was discussed in details with the patient (and family). I answered all questions. The patient agreed to proceed with the above mentioned plan. Further management will depend upon hospital course.   CODE STATUS: The patient is DNR/DNI TOTAL TIME TAKING CARE OF THIS PATIENT: 55 minutes.    Hannah Beat M.D on 05/10/2019 at 11:23 PM  [facility]   From 7 PM-7 AM, contact night-coverage [email or web address]  CC: Primary care physician; Sherron Monday, MD   Note: This dictation was prepared with Dragon dictation along with smaller phrase technology. Any transcriptional errors that result from this process are unintentional.

## 2019-05-10 NOTE — ED Triage Notes (Signed)
Pt arrives to ED from home via [facility] with c/c of altered mental status beginning this morning. Pt has indwelling urinary catheter reported as being changed 3 days ago. Urine in collection bag appears dark yellow and cloudy. Pt baseline as reported by EMS is able to verbalize. Transport vitals reported as 82/54, p 120, Afib, O2 Sat 99% on room air. Upon arrival, pt non-verbal, spontaneous eye opening, hypotensive and tachycardic. Dr Derrill Kay notified. Suspected sepsis triage protocol initiated.

## 2019-05-11 ENCOUNTER — Other Ambulatory Visit: Payer: Self-pay

## 2019-05-11 ENCOUNTER — Inpatient Hospital Stay: Payer: Medicare HMO

## 2019-05-11 DIAGNOSIS — A4150 Gram-negative sepsis, unspecified: Secondary | ICD-10-CM

## 2019-05-11 DIAGNOSIS — L8990 Pressure ulcer of unspecified site, unspecified stage: Secondary | ICD-10-CM

## 2019-05-11 DIAGNOSIS — D500 Iron deficiency anemia secondary to blood loss (chronic): Secondary | ICD-10-CM

## 2019-05-11 DIAGNOSIS — G92 Toxic encephalopathy: Secondary | ICD-10-CM

## 2019-05-11 DIAGNOSIS — N390 Urinary tract infection, site not specified: Secondary | ICD-10-CM | POA: Diagnosis present

## 2019-05-11 DIAGNOSIS — N179 Acute kidney failure, unspecified: Secondary | ICD-10-CM

## 2019-05-11 DIAGNOSIS — D62 Acute posthemorrhagic anemia: Secondary | ICD-10-CM

## 2019-05-11 DIAGNOSIS — A419 Sepsis, unspecified organism: Secondary | ICD-10-CM

## 2019-05-11 LAB — BLOOD CULTURE ID PANEL (REFLEXED)

## 2019-05-11 LAB — PREPARE RBC (CROSSMATCH)

## 2019-05-11 LAB — COMPREHENSIVE METABOLIC PANEL
ALT: 28 U/L (ref 0–44)
AST: 35 U/L (ref 15–41)
Albumin: 2.5 g/dL — ABNORMAL LOW (ref 3.5–5.0)
Alkaline Phosphatase: 81 U/L (ref 38–126)
Anion gap: 10 (ref 5–15)
BUN: 109 mg/dL — ABNORMAL HIGH (ref 8–23)
CO2: 17 mmol/L — ABNORMAL LOW (ref 22–32)
Calcium: 7.7 mg/dL — ABNORMAL LOW (ref 8.9–10.3)
Chloride: 116 mmol/L — ABNORMAL HIGH (ref 98–111)
Creatinine, Ser: 3.99 mg/dL — ABNORMAL HIGH (ref 0.61–1.24)
GFR calc Af Amer: 14 mL/min — ABNORMAL LOW (ref >60)
GFR calc non Af Amer: 12 mL/min — ABNORMAL LOW (ref >60)
Glucose, Bld: 109 mg/dL — ABNORMAL HIGH (ref 70–99)
Potassium: 5.8 mmol/L — ABNORMAL HIGH (ref 3.5–5.1)
Sodium: 143 mmol/L (ref 135–145)
Total Bilirubin: 1 mg/dL (ref 0.3–1.2)
Total Protein: 5.6 g/dL — ABNORMAL LOW (ref 6.5–8.1)

## 2019-05-11 LAB — CBC
HCT: 31.9 % — ABNORMAL LOW (ref 39.0–52.0)
Hemoglobin: 10.2 g/dL — ABNORMAL LOW (ref 13.0–17.0)
MCH: 31.3 pg (ref 26.0–34.0)
MCHC: 32 g/dL (ref 30.0–36.0)
MCV: 97.9 fL (ref 80.0–100.0)
Platelets: 72 10*3/uL — ABNORMAL LOW (ref 150–400)
RBC: 3.26 MIL/uL — ABNORMAL LOW (ref 4.22–5.81)
RDW: 16.9 % — ABNORMAL HIGH (ref 11.5–15.5)
WBC: 21.5 10*3/uL — ABNORMAL HIGH (ref 4.0–10.5)
nRBC: 0 % (ref 0.0–0.2)

## 2019-05-11 LAB — LACTIC ACID, PLASMA
Lactic Acid, Venous: 2.9 mmol/L (ref 0.5–1.9)
Lactic Acid, Venous: 1.6 mmol/L (ref 0.5–1.9)
Lactic Acid, Venous: 1 mmol/L (ref 0.5–1.9)

## 2019-05-11 LAB — PROTIME-INR
INR: 2.2 — ABNORMAL HIGH (ref 0.8–1.2)
Prothrombin Time: 24.6 seconds — ABNORMAL HIGH (ref 11.4–15.2)

## 2019-05-11 LAB — PROCALCITONIN: Procalcitonin: >150 ng/mL

## 2019-05-11 LAB — ABO/RH: ABO/RH(D): O POS

## 2019-05-11 LAB — HEMOGLOBIN
Hemoglobin: 10.7 g/dL — ABNORMAL LOW (ref 13.0–17.0)
Hemoglobin: 10.4 g/dL — ABNORMAL LOW (ref 13.0–17.0)

## 2019-05-11 LAB — CORTISOL-AM, BLOOD: Cortisol - AM: 44.7 ug/dL — ABNORMAL HIGH (ref 6.7–22.6)

## 2019-05-11 MED ORDER — SODIUM CHLORIDE 0.9 % IV SOLN: Freq: Once | INTRAVENOUS | Status: DC

## 2019-05-11 MED ORDER — INSULIN ASPART 100 UNIT/ML IV SOLN
10.0000 [IU] | Freq: Once | INTRAVENOUS | Status: AC
  Administered 2019-05-11: 10 [IU] via INTRAVENOUS
  Filled 2019-05-11: qty 0.1

## 2019-05-11 MED ORDER — CHLORHEXIDINE GLUCONATE CLOTH 2 % EX PADS
6.0000 | MEDICATED_PAD | Freq: Every day | CUTANEOUS | Status: DC
  Administered 2019-05-11 – 2019-05-19 (×8): 6 via TOPICAL

## 2019-05-11 MED ORDER — CARVEDILOL 6.25 MG PO TABS
6.2500 mg | ORAL_TABLET | Freq: Two times a day (BID) | ORAL | Status: DC
  Administered 2019-05-11 – 2019-05-14 (×7): 6.25 mg via ORAL
  Filled 2019-05-11 (×7): qty 1

## 2019-05-11 MED ORDER — SODIUM CHLORIDE 0.9 % IV SOLN
500.0000 mg | Freq: Two times a day (BID) | INTRAVENOUS | Status: DC
  Administered 2019-05-11 – 2019-05-13 (×4): 500 mg via INTRAVENOUS
  Filled 2019-05-11 (×2): qty 0.5
  Filled 2019-05-11 (×2): qty 500
  Filled 2019-05-11 (×2): qty 0.5
  Filled 2019-05-11: qty 500
  Filled 2019-05-11 (×2): qty 0.5

## 2019-05-11 MED ORDER — DEXTROSE 50 % IV SOLN
1.0000 | Freq: Once | INTRAVENOUS | Status: AC
  Administered 2019-05-11: 50 mL via INTRAVENOUS
  Filled 2019-05-11: qty 50

## 2019-05-11 NOTE — ED Notes (Signed)
Updated wife on patients current status. Will inform the oncoming nurse to call her when he is moved to his unit bed so she is able to come and see him.

## 2019-05-11 NOTE — Progress Notes (Signed)
PHARMACY - PHYSICIAN COMMUNICATION CRITICAL VALUE ALERT - BLOOD CULTURE IDENTIFICATION (BCID)  Jordan Kline is an 86 y.o. male who presented to [facility] on 05/10/2019 with a chief complaint of AMS  Assessment:  urosepsis  Name of physician (or Provider) Contacted: Georgeann Oppenheim   Current antibiotics: meropenem 500mg  Q12H  Changes to prescribed antibiotics recommended:  Patient is on recommended antibiotics - No changes needed  Results for orders placed or performed during the hospital encounter of 05/10/19  Blood Culture ID Panel (Reflexed) (Collected: 05/10/2019  9:35 PM)  Result Value Ref Range   Enterococcus species NOT DETECTED NOT DETECTED   Listeria monocytogenes NOT DETECTED NOT DETECTED   Staphylococcus species NOT DETECTED NOT DETECTED   Staphylococcus aureus (BCID) NOT DETECTED NOT DETECTED   Streptococcus species NOT DETECTED NOT DETECTED   Streptococcus agalactiae NOT DETECTED NOT DETECTED   Streptococcus pneumoniae NOT DETECTED NOT DETECTED   Streptococcus pyogenes NOT DETECTED NOT DETECTED   Acinetobacter baumannii NOT DETECTED NOT DETECTED   Enterobacteriaceae species DETECTED (A) NOT DETECTED   Enterobacter cloacae complex NOT DETECTED NOT DETECTED   Escherichia coli DETECTED (A) NOT DETECTED   Klebsiella oxytoca NOT DETECTED NOT DETECTED   Klebsiella pneumoniae NOT DETECTED NOT DETECTED   Proteus species NOT DETECTED NOT DETECTED   Serratia marcescens NOT DETECTED NOT DETECTED   Carbapenem resistance NOT DETECTED NOT DETECTED   Haemophilus influenzae NOT DETECTED NOT DETECTED   Neisseria meningitidis NOT DETECTED NOT DETECTED   Pseudomonas aeruginosa NOT DETECTED NOT DETECTED   Candida albicans NOT DETECTED NOT DETECTED   Candida glabrata NOT DETECTED NOT DETECTED   Candida krusei NOT DETECTED NOT DETECTED   Candida parapsilosis NOT DETECTED NOT DETECTED   Candida tropicalis NOT DETECTED NOT DETECTED    [name] 05/11/2019  10:05 AM

## 2019-05-11 NOTE — Progress Notes (Signed)
Pharmacy Antibiotic Note  Jordan Kline is a 86 y.o. male admitted on 05/10/2019 with AMS w/ h/o indwelling catheter that has not been replaced in a few days, afib anticoagulated w/ eliquis, arrives meeting 3/4 SIRS w/ LA 2.0 >> 2.9, and 3/3 qSOFA.  Pharmacy has been consulted for cefepime dosing.  Plan: Patient received cefepime 2g IV x 1 in ED.  Patient also w/ h/o of CKD currently AKI on CKD w/ current Scr of 4.44 baseline ~ 1.6 - 1.7. Will dose patient w/ cefepime 2g IV q24h considering severity of infx and will continue to monitor and adjust dose as necessary.  Height: 5\' 8"  (172.7 cm) Weight: 130 lb (59 kg) IBW/kg (Calculated) : 68.4  Temp (24hrs), Avg:98.7 F (37.1 C), Min:98.7 F (37.1 C), Max:98.7 F (37.1 C)  Recent Labs  Lab 05/10/19 2136 05/10/19 2321  WBC 12.3*  --   CREATININE 4.44*  --   LATICACIDVEN 2.0* 2.9*    Estimated Creatinine Clearance: 9.4 mL/min (A) (by C-G formula based on SCr of 4.44 mg/dL (H)).    No Known Allergies  Thank you for allowing pharmacy to be a part of this patient's care.  05/12/19, PharmD, BCPS Clinical Pharmacist 05/11/2019 12:25 AM

## 2019-05-11 NOTE — Progress Notes (Signed)
PROGRESS NOTE    Jordan Kline  WNI:[id number] DOB: 1928/03/17 DOA: 05/10/2019 PCP: [name], MD   Brief Narrative:  Jordan Kline  is a 86 y.o. African-American male with a known history of hypertension, dyslipidemia, CVA and chronic indwelling Foley catheter, as well as atrial fibrillation, who presented to the emergency room with acute onset of altered mental status.  Per his wife he had his indwelling Foley catheter replaced a few days ago and was noted to be altered more than usual since yesterday.  He had similar presentation with previous UTIs.  No reported nausea or vomiting or abdominal pain.  He was noted to have melena in the ER.  No reported fever or chills.  The patient stated that he was not feeling too good but otherwise has not answered any other questions.  Upon presentation to the emergency room, vital signs revealed blood pressure of 78/53 with a pulse of 117 and respirate 33 with pulse oximetry 100% on room air attention 98.7.  Labs revealed BUN of 122 with a creatinine of 4.44 compared to 66 and 1.58, lactic acid of 2 and CBC with leukocytosis 12.3 with neutrophilia as well as anemia with hemoglobin of 7 hematocrit 23.4 compared to 10 and 29.2 in January 2019.  INR is 1.9 with PTT 21.4.  Urinalysis was strongly positive for UTI.  The patient had grossly melanotic stool upon rectal exam by ER physician with positive stool Hemoccult. Blood and urine cultures were drawn.  Portable chest x-ray showed mild cardiomegaly with no acute cardiopulmonary disease.  EKG showed atrial fibrillation with rapid ventricular response of 126 with mild ST segment depression in inferior leads.  The patient was given 2 g of IV cefepime as well as IV Protonix bolus and drip in addition to hydration with normal saline for 3 L bolus.  Blood pressure improved to 112/98.  He will be admitted to a stepdown unit for further evaluation and management.  2/14: Patient seen and examined in the  emergency department.  Appears weak and deconditioned however is oriented x3.  Not entirely oriented to situation.  Baseline mental status unclear however appears to be improving.  Blood cultures now 3 out of 4 positive for gram-negative rods.  Vital signs.  Stabilized.  Will change admission order and admit to MedSurg bed.   Assessment & Plan:   Active Problems:   Sepsis due to gram-negative UTI ([facility])   Sepsis secondary to UTI ([name])   Sepsis likely secondary to urinary tract infection.   Gram-negative bacteremia secondary to urinary tract infection Patient was demonstrating signs of possible early shock on admission Blood pressure stabilized after administration of aggressive IV fluids Blood cultures now 3 of 4 positive for gram-negative rods Plan: Transfer to [facility] and replace foley catheter Meropenem IV, pharmacy dosing Continue to follow cultures for pathogen identification and sensitivity Supplemental IV fluids Vitals per unit protocol  GI bleeding with subsequent acute blood loss anemia.  Hemoglobin 7 on admission Melenic stool with positive stool occult Transfused 2 units in ED, hemoglobin responded appropriately GI apparently consulted on admission, pending formal recommendation Plan: Continue Protonix infusion Trend hemoglobin every 8 hours x1 day Follow GI recommendations Continue holding Eliquis  Atrial fibrillation with rapid ventricular response, improved Rate improved after IV fluid resuscitation Patient received 1 dose of digoxin Plan: Continue home Cardizem Continue home Coreg Telemetry monitoring Eliquis on hold as above   Metabolic encephalopathy, improved Likely secondary to sepsis Improved after fluids and antibiotics  Acute kidney injury superimposed on stage IIIb chronic kidney disease.   Baseline creatinine 1.6-1.7 AKI likely secondary to sepsis Check renal ultrasound Check urine electrolytes Monitor daily BMP Consider nephrology  consult if creatinine not improving  Dyslipidemia   Continue home statin   DVT prophylaxis: SCDs Code Status: DNR Family Communication: will discuss with wife at bedside  Disposition Plan: Unclear at this time, pending clinical improvement.  Patient will need resolution of sepsis as well as stabilization of hemoglobin before any discharge plans are made  Consultants:   GI  Procedures:  None  Antimicrobials:   Meropenem   Subjective: Seen and examined Appears lethargic but answers all questions appropriately No complaints of pain Oriented x3  Objective: Vitals:   05/11/19 0905 05/11/19 0910 05/11/19 0930 05/11/19 1010  BP: (!) 143/82 (!) 110/93 114/80 123/76  Pulse: 82 97 78 82  Resp: (!) 26 19 (!) 22 20  Temp:      TempSrc:      SpO2: 96% 98% 99% 99%  Weight:      Height:        Intake/Output Summary (Last 24 hours) at 05/11/2019 1145 Last data filed at 05/11/2019 1308 Gross per 24 hour  Intake 3825 ml  Output -  Net 3825 ml   Filed Weights   05/10/19 2130 05/11/19 0709  Weight: 59 kg 58.9 kg    Examination:  General exam: Appears calm and comfortable, appears stated age Respiratory system: Clear to auscultation. Respiratory effort normal. Cardiovascular system: S1-S2, regular rate and rhythm, no murmurs  gastrointestinal system: Abdomen is nondistended, soft and nontender. No organomegaly or masses felt. Normal bowel sounds heard. Central nervous system: Alert and oriented. No focal neurological deficits. Extremities: Symmetric 5 x 5 power. Skin: No rashes, lesions or ulcers Psychiatry: Judgement and insight appear normal. Mood & affect appropriate.     Data Reviewed: I have personally reviewed following labs and imaging studies  CBC: Recent Labs  Lab 05/10/19 2136 05/11/19 0615  WBC 12.3* 21.5*  NEUTROABS 11.6*  --   HGB 7.0* 10.2*  HCT 23.4* 31.9*  MCV 107.3* 97.9  PLT 94* 72*   Basic Metabolic Panel: Recent Labs  Lab 05/10/19  2136 05/11/19 0615  NA 142 143  K 3.8 5.8*  CL 111 116*  CO2 18* 17*  GLUCOSE 110* 109*  BUN 122* 109*  CREATININE 4.44* 3.99*  CALCIUM 8.5* 7.7*   GFR: Estimated Creatinine Clearance: 10.5 mL/min (A) (by C-G formula based on SCr of 3.99 mg/dL (H)). Liver Function Tests: Recent Labs  Lab 05/10/19 2136 05/11/19 0615  AST 24 35  ALT 22 28  ALKPHOS 133* 81  BILITOT 0.8 1.0  PROT 5.6* 5.6*  ALBUMIN 2.6* 2.5*   No results for input(s): LIPASE, AMYLASE in the last 168 hours. No results for input(s): AMMONIA in the last 168 hours. Coagulation Profile: Recent Labs  Lab 05/10/19 2136 05/11/19 0615  INR 1.9* 2.2*   Cardiac Enzymes: No results for input(s): CKTOTAL, CKMB, CKMBINDEX, TROPONINI in the last 168 hours. BNP (last 3 results) No results for input(s): PROBNP in the last 8760 hours. HbA1C: No results for input(s): HGBA1C in the last 72 hours. CBG: No results for input(s): GLUCAP in the last 168 hours. Lipid Profile: No results for input(s): CHOL, HDL, LDLCALC, TRIG, CHOLHDL, LDLDIRECT in the last 72 hours. Thyroid Function Tests: No results for input(s): TSH, T4TOTAL, FREET4, T3FREE, THYROIDAB in the last 72 hours. Anemia Panel: No results for input(s): VITAMINB12, FOLATE, FERRITIN,  TIBC, IRON, RETICCTPCT in the last 72 hours. Sepsis Labs: Recent Labs  Lab 05/10/19 2136 05/10/19 2321 05/11/19 0615  PROCALCITON  --   --  >150.00  LATICACIDVEN 2.0* 2.9*  --     Recent Results (from the past 240 hour(s))  Culture, blood (Routine x 2)     Status: None (Preliminary result)   Collection Time: 05/10/19  9:35 PM   Specimen: BLOOD  Result Value Ref Range Status   Specimen Description BLOOD RIGHT FOREARM  Final   Special Requests   Final    BOTTLES DRAWN AEROBIC AND ANAEROBIC Blood Culture adequate volume   Culture  Setup Time   Final    Organism ID to follow GRAM NEGATIVE RODS IN BOTH AEROBIC AND ANAEROBIC BOTTLES CRITICAL RESULT CALLED TO, READ BACK BY AND  VERIFIED WITH:  JODY BAREFOOT ON 05/11/2019 AT 3474 TIK Performed at [facility], [address] [place], [place] [address]    Culture GRAM NEGATIVE RODS  Final   Report Status PENDING  Incomplete  Blood Culture ID Panel (Reflexed)     Status: Abnormal   Collection Time: 05/10/19  9:35 PM  Result Value Ref Range Status   Enterococcus species NOT DETECTED NOT DETECTED Final   Listeria monocytogenes NOT DETECTED NOT DETECTED Final   Staphylococcus species NOT DETECTED NOT DETECTED Final   Staphylococcus aureus (BCID) NOT DETECTED NOT DETECTED Final   Streptococcus species NOT DETECTED NOT DETECTED Final   Streptococcus agalactiae NOT DETECTED NOT DETECTED Final   Streptococcus pneumoniae NOT DETECTED NOT DETECTED Final   Streptococcus pyogenes NOT DETECTED NOT DETECTED Final   Acinetobacter baumannii NOT DETECTED NOT DETECTED Final   Enterobacteriaceae species DETECTED (A) NOT DETECTED Final    Comment: Enterobacteriaceae represent a large family of gram-negative bacteria, not a single organism. CRITICAL RESULT CALLED TO, READ BACK BY AND VERIFIED WITH:  JODY BAREFOOT ON 05/11/2019 AT 0939 TIK    Enterobacter cloacae complex NOT DETECTED NOT DETECTED Final   Escherichia coli DETECTED (A) NOT DETECTED Final    Comment: CRITICAL RESULT CALLED TO, READ BACK BY AND VERIFIED WITH: JODY BAREFOOT ON 05/11/2019 AT 0939 TIK    Klebsiella oxytoca NOT DETECTED NOT DETECTED Final   Klebsiella pneumoniae NOT DETECTED NOT DETECTED Final   Proteus species NOT DETECTED NOT DETECTED Final   Serratia marcescens NOT DETECTED NOT DETECTED Final   Carbapenem resistance NOT DETECTED NOT DETECTED Final   Haemophilus influenzae NOT DETECTED NOT DETECTED Final   Neisseria meningitidis NOT DETECTED NOT DETECTED Final   Pseudomonas aeruginosa NOT DETECTED NOT DETECTED Final   Candida albicans NOT DETECTED NOT DETECTED Final   Candida glabrata NOT DETECTED NOT DETECTED Final   Candida krusei NOT  DETECTED NOT DETECTED Final   Candida parapsilosis NOT DETECTED NOT DETECTED Final   Candida tropicalis NOT DETECTED NOT DETECTED Final    Comment: Performed at [name], [address] [place], [place] [address]  Culture, blood (Routine x 2)     Status: None (Preliminary result)   Collection Time: 05/10/19  9:36 PM   Specimen: BLOOD  Result Value Ref Range Status   Specimen Description BLOOD RIGHT ANTECUBITAL  Final   Special Requests   Final    BOTTLES DRAWN AEROBIC AND ANAEROBIC Blood Culture adequate volume   Culture  Setup Time   Final    GRAM NEGATIVE RODS AEROBIC BOTTLE ONLY CRITICAL RESULT CALLED TO, READ BACK BY AND VERIFIED WITH: JODY BAREFOOT ON 05/11/2019 AT 0939 TIK  Performed at [facility], [address] [place], [place] [address]    Culture GRAM NEGATIVE RODS  Final   Report Status PENDING  Incomplete  Respiratory Panel by RT PCR (Flu A&B, Covid) - Nasopharyngeal Swab     Status: None   Collection Time: 05/10/19  9:36 PM   Specimen: Nasopharyngeal Swab  Result Value Ref Range Status   SARS Coronavirus 2 by RT PCR NEGATIVE NEGATIVE Final    Comment: (NOTE) SARS-CoV-2 target nucleic acids are NOT DETECTED. The SARS-CoV-2 RNA is generally detectable in upper respiratoy specimens during the acute phase of infection. The lowest concentration of SARS-CoV-2 viral copies this assay can detect is 131 copies/mL. A negative result does not preclude SARS-Cov-2 infection and should not be used as the sole basis for treatment or other patient management decisions. A negative result may occur with  improper specimen collection/handling, submission of specimen other than nasopharyngeal swab, presence of viral mutation(s) within the areas targeted by this assay, and inadequate number of viral copies (<131 copies/mL). A negative result must be combined with clinical observations, patient history, and epidemiological information. The expected result is  Negative. Fact Sheet for Patients:  https://www.moore.com/ Fact Sheet for Healthcare Providers:  https://www.young.biz/ This test is not yet ap proved or cleared by the [place] FDA and  has been authorized for detection and/or diagnosis of SARS-CoV-2 by FDA under an Emergency Use Authorization (EUA). This EUA will remain  in effect (meaning this test can be used) for the duration of the COVID-19 declaration under Section 564(b)(1) of the Act, 21 U.S.C. section 360bbb-3(b)(1), unless the authorization is terminated or revoked sooner.    Influenza A by PCR NEGATIVE NEGATIVE Final   Influenza B by PCR NEGATIVE NEGATIVE Final    Comment: (NOTE) The Xpert Xpress SARS-CoV-2/FLU/RSV assay is intended as an aid in  the diagnosis of influenza from Nasopharyngeal swab specimens and  should not be used as a sole basis for treatment. Nasal washings and  aspirates are unacceptable for Xpert Xpress SARS-CoV-2/FLU/RSV  testing. Fact Sheet for Patients: https://www.moore.com/ Fact Sheet for Healthcare Providers: https://www.young.biz/ This test is not yet approved or cleared by the [place] FDA and  has been authorized for detection and/or diagnosis of SARS-CoV-2 by  FDA under an Emergency Use Authorization (EUA). This EUA will remain  in effect (meaning this test can be used) for the duration of the  Covid-19 declaration under Section 564(b)(1) of the Act, 21  U.S.C. section 360bbb-3(b)(1), unless the authorization is  terminated or revoked. Performed at [facility], [address] [place], [place] [address]          Radiology Studies: DG Chest 1 View  Result Date: 05/10/2019 CLINICAL DATA:  Altered mental status EXAM: CHEST  1 VIEW COMPARISON:  04/08/2017 FINDINGS: Mild cardiomegaly. No acute consolidation or effusion. Aortic atherosclerosis. No pneumothorax. IMPRESSION: No active disease.   Mild cardiomegaly. Electronically Signed   By: Jasmine Pang M.D.   On: 05/10/2019 22:00        Scheduled Meds: . acidophilus  1 capsule Oral BID  . atorvastatin  80 mg Oral QPM  . carvedilol  6.25 mg Oral BID  . Chlorhexidine Gluconate Cloth  6 each Topical Daily  . diltiazem  120 mg Oral QPM  . ferrous sulfate  325 mg Oral BID WC  . multivitamin with minerals  1 tablet Oral Daily   Continuous Infusions: . sodium chloride    . sodium chloride Stopped (05/11/19 0057)  .  sodium chloride    . sodium chloride    . meropenem (MERREM) IV    . pantoprozole (PROTONIX) infusion 8 mg/hr (05/11/19 0147)     LOS: 1 day    Time spent: 35 minutes    Tresa Moore, MD [facility] Pager [phone]  If 7PM-7AM, please contact night-coverage  05/11/2019, 11:45 AM

## 2019-05-11 NOTE — Progress Notes (Signed)
CODE SEPSIS - PHARMACY COMMUNICATION  **Broad Spectrum Antibiotics should be administered within 1 hour of Sepsis diagnosis**  Time Code Sepsis Called/Page Received: 2305  Antibiotics Ordered: 2147  Time of 1st antibiotic administration: vanc/cefepime  Additional action taken by pharmacy:   If necessary, Name of Provider/Nurse Contacted:     Thomasene Ripple ,PharmD Clinical Pharmacist  05/11/2019  12:19 AM

## 2019-05-11 NOTE — ED Notes (Signed)
Pt unable to sign transfusion authorization. Called spouse [employer] and received verbal authorization for transfusion.

## 2019-05-11 NOTE — ED Notes (Signed)
RN has been on hold for 7 minutes and 40 seconds at this time attempting to ensure patient may be transported to assigned room and bed. RN will hang up and call back again a second time.

## 2019-05-11 NOTE — Consult Note (Signed)
Cephas Darby, MD [address]  [place]  [place], [place] [address]  Main: [phone]  Fax: [phone] Pager: [phone]   Consultation  Referring Provider:     No ref. provider found Primary Care Physician:  [name], MD Primary Gastroenterologist:   Althia Forts Reason for Consultation: Melena, acute on chronic anemia  Date of Admission:  05/10/2019 Date of Consultation:  05/11/2019         HPI:   Jordan Kline is a 86 y.o. male with known history of hypertension, cerebrovascular accident, left hemiparesis, chronic indwelling Foley catheter, history of recurrent UTIs, A. fib on Eliquis who is admitted with altered mental status, found to have severe sepsis secondary to UTI, also noted to have black tarry stool.  His hemoglobin was 7 on admission, compared to 10 on 04/24/2017.  He also has new onset of thrombocytopenia.  Patient received 2 units of PRBCs and responded appropriately, hemoglobin today is 10.7.  He has severe AKI, BUN/creatinine 109/3.99.  Patient was also in A. fib with RVR on admission.  Started on antibiotics for UTI, pantoprazole drip, aggressive IV fluids along with blood transfusion which resulted in improvement in his hemodynamics.  Patient's wife is bedside who answers most of the questions, patient responds to simple questions.  He had 2 episodes of black tarry stool today per nurse.  Patient denies abdominal pain, nausea or vomiting.  His last dose of Eliquis was yesterday.   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 Lucky Cowboy  Known history of iron deficiency anemia since 02/2015  NSAIDs: None  Antiplts/Anticoagulants/Anti thrombotics: Eliquis for history of A. fib  GI Procedures: None  Past Medical History:  Diagnosis Date  . A-fib ([facility])    On Xarelto    . Chronic kidney disease (CKD)   . High cholesterol   . Hypertension   . Inguinal hernia   . Stroke [facility])    With left-sided hemiparesis  . Urinary retention    Chronic Foley catheter since December 2016    Past Surgical History:  Procedure Laterality Date  . APPENDECTOMY    . PERIPHERAL VASCULAR CATHETERIZATION N/A 11/10/2015   Procedure: Visceral Angiography, mesenteric angio;  Surgeon: Algernon Huxley, MD;  Location: [facility];  Service: Cardiovascular;  Laterality: N/A;  . PERIPHERAL VASCULAR CATHETERIZATION N/A 11/10/2015   Procedure: Visceral Artery Intervention;  Surgeon: Algernon Huxley, MD;  Location: [place] [facility];  Service: Cardiovascular;  Laterality: N/A;    Prior to Admission medications   Medication Sig Start Date End Date Taking? Authorizing Provider  acetaminophen (TYLENOL) 325 MG tablet Take 650 mg by mouth every 4 (four) hours as needed. for pain/ increased temp. May be administered orally, per G-tube if needed or rectally if unable to swallow (separate order). Maximum dose for 24 hours is 3,000 mg from all sources of Acetaminophen/ Tylenol   Yes [provider]  Amino Acids-Protein Hydrolys (FEEDING SUPPLEMENT, PRO-STAT SUGAR FREE 64,) LIQD Take 30 mLs by mouth 2 (two) times daily between meals.   Yes [provider]  apixaban (ELIQUIS) 2.5 MG TABS tablet Take 1 tablet (2.5 mg total) by mouth 2 (two) times daily. 04/16/17  Yes Wieting, Richard, MD  atorvastatin (LIPITOR) 80 MG tablet Take 80 mg by mouth every evening.    Yes  [provider]  carvedilol (COREG) 6.25 MG tablet Take 1 tablet (6.25 mg total) by mouth 2 (two) times daily. 04/16/17  Yes Wieting, Richard, MD  diltiazem (CARDIZEM CD) 120 MG 24 hr capsule Take 120 mg by mouth every evening.    Yes [provider]  docusate sodium (COLACE) 100 MG capsule Take 100 mg by mouth 2 (two) times daily.    Yes [provider]  ferrous sulfate 325 (65 FE) MG tablet  Take 1 tablet (325 mg total) by mouth 2 (two) times daily with a meal. 03/04/15  Yes Dustin Flock, MD  food thickener (THICK IT) POWD Take by mouth. Use to mix thin liquids to nectar thick consistency as needed   Yes [provider]  [place] (DERMACLOUD) CREA Apply liberal amount topically to area of skin irritation prn. OK to leave at bedside.   Yes [provider]  Multiple Vitamin (MULTIVITAMIN WITH MINERALS) TABS tablet Take 1 tablet by mouth daily.   Yes [provider]  mupirocin cream (BACTROBAN) 2 % Apply thin film topically to open areas on penis 2 times a day until healed   Yes [provider]  Nutritional Supplements (ENSURE ENLIVE PO) Take 1 Bottle by mouth 3 (three) times daily. For 8 oz bottle, add 2 tablespoons of thickner. Stir for 15 seconds until dissolved.   Yes [provider]  Probiotic Product (RISA-BID PROBIOTIC) TABS Take 1 tablet by mouth 2 (two) times daily.   Yes [provider]  sacubitril-valsartan (ENTRESTO) 49-51 MG Take 1 tablet by mouth 2 (two) times daily. 04/16/17  Yes Loletha Grayer, MD    Current Facility-Administered Medications:  .  0.9 %  sodium chloride infusion, 10 mL/hr, Intravenous, Once, Nance Pear, MD .  0.9 %  sodium chloride infusion, , Intravenous, Continuous, Mansy, Jan A, MD, Last Rate: 125 mL/hr at 05/11/19 1450, Rate Verify at 05/11/19 1450 .  0.9 %  sodium chloride infusion, , Intravenous, Once, Sharion Settler, NP .  0.9 %  sodium chloride infusion, , Intravenous, Once, Mansy, Jan A, MD .  acetaminophen (TYLENOL) tablet 650 mg, 650 mg, Oral, Q6H PRN **OR** acetaminophen (TYLENOL) suppository 650 mg, 650 mg, Rectal, Q6H PRN, Mansy, Jan A, MD .  acidophilus (RISAQUAD) capsule 1 capsule, 1 capsule, Oral, BID, Mansy, Jan A, MD, 1 capsule at 05/11/19 1204 .  atorvastatin (LIPITOR) tablet 80 mg, 80 mg, Oral, QPM, Mansy, Jan A, MD .  carvedilol (COREG) tablet 6.25 mg, 6.25  mg, Oral, BID, Sreenath, Sudheer B, MD, 6.25 mg at 05/11/19 1210 .  Chlorhexidine Gluconate Cloth 2 % PADS 6 each, 6 each, Topical, Daily, Ralene Muskrat B, MD, 6 each at 05/11/19 1321 .  diltiazem (CARDIZEM CD) 24 hr capsule 120 mg, 120 mg, Oral, QPM, Mansy, Jan A, MD .  meropenem (MERREM) 500 mg in sodium chloride 0.9 % 100 mL IVPB, 500 mg, Intravenous, Q12H, Hallaji, Sheema M, RPH, Stopped at 05/11/19 1419 .  multivitamin with minerals tablet 1 tablet, 1 tablet, Oral, Daily, Mansy, Jan A, MD, 1 tablet at 05/11/19 0913 .  pantoprazole (PROTONIX) 80 mg in sodium chloride 0.9 % 250 mL (0.32 mg/mL) infusion, 8 mg/hr, Intravenous, Continuous, Nance Pear, MD, Last Rate: 25 mL/hr at 05/11/19 1450, 8 mg/hr at 05/11/19 1450   Family History  Problem Relation Age of Onset  . Hypertension Father   . Kidney cancer Neg Hx   . Kidney disease Neg Hx   . Prostate cancer Neg Hx  Social History   Tobacco Use  . Smoking status: Former Research scientist (life sciences)  . Smokeless tobacco: Never Used  . Tobacco comment: quit 50 years  Substance Use Topics  . Alcohol use: No    Alcohol/week: 0.0 standard drinks  . Drug use: No    Allergies as of 05/10/2019  . (No Known Allergies)    Review of Systems:    All systems reviewed and negative except where noted in HPI.   Physical Exam:  Vital signs in last 24 hours: Temp:  [97.7 F (36.5 C)-98.7 F (37.1 C)] 97.9 F (36.6 C) (02/14 1212) Pulse Rate:  [32-118] 105 (02/14 1212) Resp:  [16-46] 20 (02/14 1212) BP: (78-143)/(52-98) 143/91 (02/14 1212) SpO2:  [96 %-100 %] 100 % (02/14 1212) Weight:  [58.9 kg-59 kg] 58.9 kg (02/14 0709) Last BM Date: 05/11/19 General:   Pleasant, cooperative in NAD Head:  Normocephalic and atraumatic. Eyes:   No icterus.   Conjunctiva pink. PERRLA. Ears:  Normal auditory acuity. Neck:  Supple; no masses or thyroidomegaly Lungs: Respirations even and unlabored. Lungs clear to auscultation bilaterally.   No wheezes, crackles,  or rhonchi.  Heart: Tachycardia, irregular rhythm;  Without murmur, clicks, rubs or gallops Abdomen:  Soft, nondistended, nontender. Normal bowel sounds. No appreciable masses or hepatomegaly.  No rebound or guarding.  Rectal:  Not performed. Msk:  Left-sided weakness Extremities:  Without edema, cyanosis or clubbing. Neurologic:  Alert and oriented x1, slurred speech Skin:  Intact without significant lesions or rashes.  LAB RESULTS: CBC Latest Ref Rng & Units 05/11/2019 05/11/2019 05/10/2019  WBC 4.0 - 10.5 K/uL - 21.5(H) 12.3(H)  Hemoglobin 13.0 - 17.0 g/dL 10.7(L) 10.2(L) 7.0(L)  Hematocrit 39.0 - 52.0 % - 31.9(L) 23.4(L)  Platelets 150 - 400 K/uL - 72(L) 94(L)    BMET BMP Latest Ref Rng & Units 05/11/2019 05/10/2019 04/19/2017  Glucose 70 - 99 mg/dL 109(H) 110(H) 94  BUN 8 - 23 mg/dL 109(H) 122(H) 66(H)  Creatinine 0.61 - 1.24 mg/dL 3.99(H) 4.44(H) 1.58(H)  Sodium 135 - 145 mmol/L 143 142 144  Potassium 3.5 - 5.1 mmol/L 5.8(H) 3.8 4.8  Chloride 98 - 111 mmol/L 116(H) 111 110  CO2 22 - 32 mmol/L 17(L) 18(L) 27  Calcium 8.9 - 10.3 mg/dL 7.7(L) 8.5(L) 8.0(L)    LFT Hepatic Function Latest Ref Rng & Units 05/11/2019 05/10/2019 04/19/2017  Total Protein 6.5 - 8.1 g/dL 5.6(L) 5.6(L) 5.4(L)  Albumin 3.5 - 5.0 g/dL 2.5(L) 2.6(L) 1.8(L)  AST 15 - 41 U/L 35 24 155(H)  ALT 0 - 44 U/L 28 22 136(H)  Alk Phosphatase 38 - 126 U/L 81 133(H) 64  Total Bilirubin 0.3 - 1.2 mg/dL 1.0 0.8 0.6     STUDIES: DG Chest 1 View  Result Date: 05/10/2019 CLINICAL DATA:  Altered mental status EXAM: CHEST  1 VIEW COMPARISON:  04/08/2017 FINDINGS: Mild cardiomegaly. No acute consolidation or effusion. Aortic atherosclerosis. No pneumothorax. IMPRESSION: No active disease.  Mild cardiomegaly. Electronically Signed   By: Donavan Foil M.D.   On: 05/10/2019 22:00   US RENAL  Result Date: 05/11/2019 CLINICAL DATA:  Acute renal injury EXAM: RENAL / URINARY TRACT ULTRASOUND COMPLETE COMPARISON:  03/01/2015  FINDINGS: Right Kidney: Renal measurements: 13.1 x 6.0 x 5.0 cm. = volume: 207 mL. Diffuse increased echogenicity is noted. Multiple cysts are noted throughout the right kidney. The largest of these measures 6.1 cm. Greater than 10 cysts are noted. Left Kidney: Renal measurements: 12.0 x 6.3 x 5.3 cm. = volume: 212  mL. Diffuse increased echogenicity is noted. Multiple cysts are noted. The largest of these measures 6.8 cm. Greater than 10 cysts are noted. Bladder: Decompressed by Foley catheter. Other: None. IMPRESSION: Bilateral renal cysts. Increased echogenicity consistent with medical renal disease. Electronically Signed   By: Inez Catalina M.D.   On: 05/11/2019 15:20      Impression / Plan:   Jordan Kline is a 86 y.o. male with history of hypertension, dyslipidemia, CVA with left-sided weakness, indwelling Foley, recurrent UTIs, A. fib on Eliquis is admitted with sepsis secondary to UTI as well as melena.  Patient did not require pressors, responded to volume resuscitation, currently hemodynamically stable.  Melena, acute on chronic anemia: Known history of iron deficiency anemia Differentials include erosive esophagitis or H. pylori gastritis or peptic ulcer disease or AVMs or GI malignancy Hemoglobin responded appropriately to blood transfusion Monitor CBC closely and transfuse to maintain hemoglobin greater than 8 Check H. pylori IgG and treat if positive Continue pantoprazole drip Recommend upper endoscopy after discontinuation of anticoagulation for 3 days which will be on 2/17 as patient is currently hemodynamically stable.  If EGD is unremarkable, he may need further endoscopic evaluation Discontinue oral iron while inpatient Parenteral iron therapy after sepsis is cleared  Acute onset of thrombocytopenia Most likely in the setting of sepsis Monitor for now  AKI on CKD: Likely combination of UTI and GI bleed S/p indwelling Foley catheter On maintenance IV  fluids BUN/creatinine downtrending   Thank you for involving me in the care of this patient.  Dr. Vicente Males will cover from tomorrow    LOS: 1 day   Sherri Sear, MD  05/11/2019, 4:27 PM   Note: This dictation was prepared with Dragon dictation along with smaller phrase technology. Any transcriptional errors that result from this process are unintentional.

## 2019-05-11 NOTE — ED Notes (Signed)
Dr Arville Care notified about downward trend in pressure. Received verbal orders to increase current NS infusion to 999 bolus.

## 2019-05-11 NOTE — ED Notes (Signed)
Assisted Dr Derrill Kay with rectal exam. Pt diaper with large quantity of dark tarry stool. Occult blood positive. Pt placed in clean diaper, hygiene and peri care completed. Pt tolerated procedure well.

## 2019-05-11 NOTE — Progress Notes (Signed)
Foley catheter has been replaced.

## 2019-05-11 NOTE — Consult Note (Signed)
Pharmacy Antibiotic Note  Jordan Kline is a 86 y.o. male admitted on 05/10/2019 with AMS w/ h/o indwelling catheter that has not been replaced in a few days, afib anticoagulated w/ eliquis, arrives meeting 3/4 SIRS w/ LA 2.0 >> 2.9, and 3/3 qSOFA. BCx  3 of 4 GNR. BCID E.Coli   Pharmacy has been consulted for Meropenem dosing.  Patient also w/ h/o of CKD currently AKI on CKD. Admission Scr of 4.44. Scr down to 3.99 this morning.  baseline ~ 1.6 - 1.7.  Plan: Start meropenem 500mg  IV every 12 hours based on current CrCl 10-73ml/min.   Pharmacy will continue to monitor renal function and adjust dose as needed.   Height: 5\' 8"  (172.7 cm) Weight: 129 lb 13.6 oz (58.9 kg) IBW/kg (Calculated) : 68.4  Temp (24hrs), Avg:97.9 F (36.6 C), Min:97.7 F (36.5 C), Max:98.7 F (37.1 C)  Recent Labs  Lab 05/10/19 2136 05/10/19 2321 05/11/19 0615  WBC 12.3*  --  21.5*  CREATININE 4.44*  --  3.99*  LATICACIDVEN 2.0* 2.9*  --     Estimated Creatinine Clearance: 10.5 mL/min (A) (by C-G formula based on SCr of 3.99 mg/dL (H)).    No Known Allergies  Antimicrobials this admission: 2/13 cefepime>> 2/14 2/14 Meropenem >>   Microbiology results: 2/ 13 BCx:3 of 4  GNR 2/13  BCID: E. Coli  2/13  UCx: pending  Thank you for allowing pharmacy to be a part of this patient's care.  3/13, PharmD, BCPS Clinical Pharmacist 05/11/2019 9:53 AM

## 2019-05-12 DIAGNOSIS — K921 Melena: Secondary | ICD-10-CM

## 2019-05-12 LAB — BASIC METABOLIC PANEL
Anion gap: 7 (ref 5–15)
BUN: 113 mg/dL — ABNORMAL HIGH (ref 8–23)
CO2: 17 mmol/L — ABNORMAL LOW (ref 22–32)
Calcium: 7.6 mg/dL — ABNORMAL LOW (ref 8.9–10.3)
Chloride: 122 mmol/L — ABNORMAL HIGH (ref 98–111)
Creatinine, Ser: 3.51 mg/dL — ABNORMAL HIGH (ref 0.61–1.24)
GFR calc Af Amer: 17 mL/min — ABNORMAL LOW (ref >60)
GFR calc non Af Amer: 15 mL/min — ABNORMAL LOW (ref >60)
Glucose, Bld: 77 mg/dL (ref 70–99)
Potassium: 4.5 mmol/L (ref 3.5–5.1)
Sodium: 146 mmol/L — ABNORMAL HIGH (ref 135–145)

## 2019-05-12 LAB — CBC WITH DIFFERENTIAL/PLATELET
Abs Immature Granulocytes: 0.33 10*3/uL — ABNORMAL HIGH (ref 0.00–0.07)
Basophils Absolute: 0 10*3/uL (ref 0.0–0.1)
Basophils Relative: 0 %
Eosinophils Absolute: 0.3 10*3/uL (ref 0.0–0.5)
Eosinophils Relative: 2 %
HCT: 31.4 % — ABNORMAL LOW (ref 39.0–52.0)
Hemoglobin: 10.2 g/dL — ABNORMAL LOW (ref 13.0–17.0)
Immature Granulocytes: 2 %
Lymphocytes Relative: 3 %
Lymphs Abs: 0.5 10*3/uL — ABNORMAL LOW (ref 0.7–4.0)
MCH: 31.5 pg (ref 26.0–34.0)
MCHC: 32.5 g/dL (ref 30.0–36.0)
MCV: 96.9 fL (ref 80.0–100.0)
Monocytes Absolute: 1.6 10*3/uL — ABNORMAL HIGH (ref 0.1–1.0)
Monocytes Relative: 9 %
Neutro Abs: 15.8 10*3/uL — ABNORMAL HIGH (ref 1.7–7.7)
Neutrophils Relative %: 84 %
Platelets: 80 10*3/uL — ABNORMAL LOW (ref 150–400)
RBC: 3.24 MIL/uL — ABNORMAL LOW (ref 4.22–5.81)
RDW: 18 % — ABNORMAL HIGH (ref 11.5–15.5)
WBC Morphology: INCREASED
WBC: 18.5 10*3/uL — ABNORMAL HIGH (ref 4.0–10.5)
nRBC: 0 % (ref 0.0–0.2)

## 2019-05-12 LAB — TYPE AND SCREEN
ABO/RH(D): O POS
Antibody Screen: NEGATIVE
Unit division: 0
Unit division: 0
Unit division: 0

## 2019-05-12 LAB — BPAM RBC
Blood Product Expiration Date: 202103062359
Blood Product Expiration Date: 202103102359
Blood Product Expiration Date: 202103132359
ISSUE DATE / TIME: 202102140048
ISSUE DATE / TIME: 202102140403
ISSUE DATE / TIME: 202102142023
Unit Type and Rh: 5100
Unit Type and Rh: 5100
Unit Type and Rh: 5100

## 2019-05-12 MED ORDER — NEPRO/CARBSTEADY PO LIQD
237.0000 mL | Freq: Three times a day (TID) | ORAL | Status: DC
  Administered 2019-05-12 – 2019-05-19 (×20): 237 mL via ORAL

## 2019-05-12 NOTE — Progress Notes (Signed)
PROGRESS NOTE    Jordan Kline  BWG:[id number] DOB: 1927-05-08 DOA: 05/10/2019 PCP: Sherron Monday, MD   Brief Narrative:  Jordan Kline  is a 86 y.o. African-American male with a known history of hypertension, dyslipidemia, CVA and chronic indwelling Foley catheter, as well as atrial fibrillation, who presented to the emergency room with acute onset of altered mental status.  Per his wife he had his indwelling Foley catheter replaced a few days ago and was noted to be altered more than usual since yesterday.  He had similar presentation with previous UTIs.  No reported nausea or vomiting or abdominal pain.  He was noted to have melena in the ER.  No reported fever or chills.  The patient stated that he was not feeling too good but otherwise has not answered any other questions.  Upon presentation to the emergency room, vital signs revealed blood pressure of 78/53 with a pulse of 117 and respirate 33 with pulse oximetry 100% on room air attention 98.7.  Labs revealed BUN of 122 with a creatinine of 4.44 compared to 66 and 1.58, lactic acid of 2 and CBC with leukocytosis 12.3 with neutrophilia as well as anemia with hemoglobin of 7 hematocrit 23.4 compared to 10 and 29.2 in January 2019.  INR is 1.9 with PTT 21.4.  Urinalysis was strongly positive for UTI.  The patient had grossly melanotic stool upon rectal exam by ER physician with positive stool Hemoccult. Blood and urine cultures were drawn.  Portable chest x-ray showed mild cardiomegaly with no acute cardiopulmonary disease.  EKG showed atrial fibrillation with rapid ventricular response of 126 with mild ST segment depression in inferior leads.  The patient was given 2 g of IV cefepime as well as IV Protonix bolus and drip in addition to hydration with normal saline for 3 L bolus.  Blood pressure improved to 112/98.  He will be admitted to a stepdown unit for further evaluation and management.  2/14: Patient seen and examined in the  emergency department.  Appears weak and deconditioned however is oriented x3.  Not entirely oriented to situation.  Baseline mental status unclear however appears to be improving.  Blood cultures now 3 out of 4 positive for gram-negative rods.  Vital signs.  Stabilized.  Will change admission order and admit to MedSurg bed.  2/15: Patient seen and examined.  Energy improved this morning.  Alert oriented x3.  Appears to be baseline mental status.  No fevers noted over interval.  Remains on meropenem.  Renal ultrasound negative for hydro.   Assessment & Plan:   Active Problems:   Sepsis due to gram-negative UTI (HCC)   Sepsis secondary to UTI (HCC)   Pressure injury of skin   Sepsis likely secondary to urinary tract infection.   E. coli bacteremia secondary to urinary tract infection Patient was demonstrating signs of possible early shock on admission Blood pressure stabilized after administration of aggressive IV fluids Blood culture positive for E. coli, sensitivities pending .  Catheter replaced on admission Plan: Continue meropenem IV, pharmacy to dose Follow cultures for sensitivities and de-escalation of antibiotics  GI bleeding with subsequent acute blood loss anemia.  Hemoglobin 7 on admission Melenic stool with positive stool occult Transfused 2 units in ED, hemoglobin responded appropriately GI apparently consulted on admission, pending formal recommendation Plan: Continue Protonix infusion Trend hemoglobin, transfuse as needed hemoglobin less than 7 Follow GI recommendations, anticipate scope later in hospitalization Continue holding Eliquis  Acute kidney injury superimposed on stage IIIb  chronic kidney disease.   Baseline creatinine 1.6-1.7 AKI likely secondary to sepsis Elevated BUN secondary to GI bleed Ultrasound negative for hydro Function.  No renal Supplemental IVF Daily BMP  Atrial fibrillation with rapid ventricular response, improved Rate improved  after IV fluid resuscitation Patient received 1 dose of digoxin Plan: Continue home Cardizem Continue home Coreg Telemetry monitoring Eliquis on hold as above   Metabolic encephalopathy, improved Likely secondary to sepsis Improved after fluids and antibiotics  Dyslipidemia   Continue home statin   DVT prophylaxis: SCDs Code Status: DNR Family Communication: Wife at bedside, 05/11/2019 Disposition Plan: Unclear at this time, pending clinical improvement.  Patient will need resolution of sepsis as well as stabilization of hemoglobin before any discharge plans are made  Consultants:   GI  Procedures:  None  Antimicrobials:   Meropenem   Subjective: Seen and examined Oriented X3 Answers all questions appropriately No complaints  Objective: Vitals:   05/12/19 0540 05/12/19 1022 05/12/19 1140 05/12/19 1145  BP: 120/89 127/78 121/69 121/68  Pulse: (!) 108 (!) 109 69 72  Resp: 20  (!) 24 (!) 22  Temp: 97.6 F (36.4 C) 97.7 F (36.5 C) (!) 97.5 F (36.4 C) 97.7 F (36.5 C)  TempSrc: Oral Oral Oral Oral  SpO2: 100% 100% 96% 99%  Weight:      Height:        Intake/Output Summary (Last 24 hours) at 05/12/2019 1434 Last data filed at 05/12/2019 0606 Gross per 24 hour  Intake 2879.51 ml  Output 850 ml  Net 2029.51 ml   Filed Weights   05/10/19 2130 05/11/19 0709  Weight: 59 kg 58.9 kg    Examination:  General exam: Appears calm and comfortable, appears stated age Respiratory system: Clear to auscultation. Respiratory effort normal. Cardiovascular system: S1-S2, regular rate and rhythm, no murmurs  gastrointestinal system: Abdomen is nondistended, soft and nontender. No organomegaly or masses felt. Normal bowel sounds heard. Central nervous system: Alert and oriented. No focal neurological deficits. Extremities: Symmetric 5 x 5 power. Skin: No rashes, lesions or ulcers Psychiatry: Judgement and insight appear normal. Mood & affect appropriate.      Data Reviewed: I have personally reviewed following labs and imaging studies  CBC: Recent Labs  Lab 05/10/19 2136 05/11/19 0615 05/11/19 1348 05/11/19 1902 05/12/19 0428  WBC 12.3* 21.5*  --   --  18.5*  NEUTROABS 11.6*  --   --   --  15.8*  HGB 7.0* 10.2* 10.7* 10.4* 10.2*  HCT 23.4* 31.9*  --   --  31.4*  MCV 107.3* 97.9  --   --  96.9  PLT 94* 72*  --   --  80*   Basic Metabolic Panel: Recent Labs  Lab 05/10/19 2136 05/11/19 0615 05/12/19 0428  NA 142 143 146*  K 3.8 5.8* 4.5  CL 111 116* 122*  CO2 18* 17* 17*  GLUCOSE 110* 109* 77  BUN 122* 109* 113*  CREATININE 4.44* 3.99* 3.51*  CALCIUM 8.5* 7.7* 7.6*   GFR: Estimated Creatinine Clearance: 11.9 mL/min (A) (by C-G formula based on SCr of 3.51 mg/dL (H)). Liver Function Tests: Recent Labs  Lab 05/10/19 2136 05/11/19 0615  AST 24 35  ALT 22 28  ALKPHOS 133* 81  BILITOT 0.8 1.0  PROT 5.6* 5.6*  ALBUMIN 2.6* 2.5*   No results for input(s): LIPASE, AMYLASE in the last 168 hours. No results for input(s): AMMONIA in the last 168 hours. Coagulation Profile: Recent Labs  Lab 05/10/19  2136 05/11/19 0615  INR 1.9* 2.2*   Cardiac Enzymes: No results for input(s): CKTOTAL, CKMB, CKMBINDEX, TROPONINI in the last 168 hours. BNP (last 3 results) No results for input(s): PROBNP in the last 8760 hours. HbA1C: No results for input(s): HGBA1C in the last 72 hours. CBG: No results for input(s): GLUCAP in the last 168 hours. Lipid Profile: No results for input(s): CHOL, HDL, LDLCALC, TRIG, CHOLHDL, LDLDIRECT in the last 72 hours. Thyroid Function Tests: No results for input(s): TSH, T4TOTAL, FREET4, T3FREE, THYROIDAB in the last 72 hours. Anemia Panel: No results for input(s): VITAMINB12, FOLATE, FERRITIN, TIBC, IRON, RETICCTPCT in the last 72 hours. Sepsis Labs: Recent Labs  Lab 05/10/19 2136 05/10/19 2321 05/11/19 0615 05/11/19 1348 05/11/19 1736  PROCALCITON  --   --  >150.00  --   --    LATICACIDVEN 2.0* 2.9*  --  1.6 1.0    Recent Results (from the past 240 hour(s))  Culture, blood (Routine x 2)     Status: None (Preliminary result)   Collection Time: 05/10/19  9:35 PM   Specimen: BLOOD  Result Value Ref Range Status   Specimen Description   Final    BLOOD RIGHT FOREARM Performed at [facility], [address] [place], [place] [address]    Special Requests   Final    BOTTLES DRAWN AEROBIC AND ANAEROBIC Blood Culture adequate volume Performed at [facility], [address] [place], [place] [address]    Culture  Setup Time   Final    GRAM NEGATIVE RODS IN BOTH AEROBIC AND ANAEROBIC BOTTLES CRITICAL RESULT CALLED TO, READ BACK BY AND VERIFIED WITH:  JODY BAREFOOT ON 05/11/2019 AT 0939 TIK    Culture   Final    GRAM NEGATIVE RODS CULTURE REINCUBATED FOR BETTER GROWTH Performed at [facility], [address], [place], [place] [address]    Report Status PENDING  Incomplete  Blood Culture ID Panel (Reflexed)     Status: Abnormal   Collection Time: 05/10/19  9:35 PM  Result Value Ref Range Status   Enterococcus species NOT DETECTED NOT DETECTED Final   Listeria monocytogenes NOT DETECTED NOT DETECTED Final   Staphylococcus species NOT DETECTED NOT DETECTED Final   Staphylococcus aureus (BCID) NOT DETECTED NOT DETECTED Final   Streptococcus species NOT DETECTED NOT DETECTED Final   Streptococcus agalactiae NOT DETECTED NOT DETECTED Final   Streptococcus pneumoniae NOT DETECTED NOT DETECTED Final   Streptococcus pyogenes NOT DETECTED NOT DETECTED Final   Acinetobacter baumannii NOT DETECTED NOT DETECTED Final   Enterobacteriaceae species DETECTED (A) NOT DETECTED Final    Comment: Enterobacteriaceae represent a large family of gram-negative bacteria, not a single organism. CRITICAL RESULT CALLED TO, READ BACK BY AND VERIFIED WITH:  JODY BAREFOOT ON 05/11/2019 AT 0939 TIK    Enterobacter cloacae complex NOT DETECTED NOT DETECTED  Final   Escherichia coli DETECTED (A) NOT DETECTED Final    Comment: CRITICAL RESULT CALLED TO, READ BACK BY AND VERIFIED WITH: JODY BAREFOOT ON 05/11/2019 AT 0939 TIK    Klebsiella oxytoca NOT DETECTED NOT DETECTED Final   Klebsiella pneumoniae NOT DETECTED NOT DETECTED Final   Proteus species NOT DETECTED NOT DETECTED Final   Serratia marcescens NOT DETECTED NOT DETECTED Final   Carbapenem resistance NOT DETECTED NOT DETECTED Final   Haemophilus influenzae NOT DETECTED NOT DETECTED Final   Neisseria meningitidis NOT DETECTED NOT DETECTED Final   Pseudomonas aeruginosa NOT DETECTED NOT DETECTED Final   Candida albicans  NOT DETECTED NOT DETECTED Final   Candida glabrata NOT DETECTED NOT DETECTED Final   Candida krusei NOT DETECTED NOT DETECTED Final   Candida parapsilosis NOT DETECTED NOT DETECTED Final   Candida tropicalis NOT DETECTED NOT DETECTED Final    Comment: Performed at [facility], [address] [place], [place] [address]  Culture, blood (Routine x 2)     Status: Abnormal (Preliminary result)   Collection Time: 05/10/19  9:36 PM   Specimen: BLOOD  Result Value Ref Range Status   Specimen Description   Final    BLOOD RIGHT ANTECUBITAL Performed at [facility], [address], [place], [place] [address]    Special Requests   Final    BOTTLES DRAWN AEROBIC AND ANAEROBIC Blood Culture adequate volume Performed at [facility], [place] [place], [place] [address]    Culture  Setup Time   Final    GRAM NEGATIVE RODS IN BOTH AEROBIC AND ANAEROBIC BOTTLES CRITICAL RESULT CALLED TO, READ BACK BY AND VERIFIED WITH: JODY BAREFOOT ON 05/11/2019 AT 1937 TIK Performed at [facility], [address] [place], [place] [address]    Culture (A)  Final    ESCHERICHIA COLI SUSCEPTIBILITIES TO FOLLOW Performed at [facility], [place] [address], [place],  [address]    Report Status PENDING  Incomplete  Respiratory Panel  by RT PCR (Flu A&B, Covid) - Nasopharyngeal Swab     Status: None   Collection Time: 05/10/19  9:36 PM   Specimen: Nasopharyngeal Swab  Result Value Ref Range Status   SARS Coronavirus 2 by RT PCR NEGATIVE NEGATIVE Final    Comment: (NOTE) SARS-CoV-2 target nucleic acids are NOT DETECTED. The SARS-CoV-2 RNA is generally detectable in upper respiratoy specimens during the acute phase of infection. The lowest concentration of SARS-CoV-2 viral copies this assay can detect is 131 copies/mL. A negative result does not preclude SARS-Cov-2 infection and should not be used as the sole basis for treatment or other patient management decisions. A negative result may occur with  improper specimen collection/handling, submission of specimen other than nasopharyngeal swab, presence of viral mutation(s) within the areas targeted by this assay, and inadequate number of viral copies (<131 copies/mL). A negative result must be combined with clinical observations, patient history, and epidemiological information. The expected result is Negative. Fact Sheet for Patients:  PinkCheek.be Fact Sheet for Healthcare Providers:  GravelBags.it This test is not yet ap proved or cleared by the [place] FDA and  has been authorized for detection and/or diagnosis of SARS-CoV-2 by FDA under an Emergency Use Authorization (EUA). This EUA will remain  in effect (meaning this test can be used) for the duration of the COVID-19 declaration under Section 564(b)(1) of the Act, 21 U.S.C. section 360bbb-3(b)(1), unless the authorization is terminated or revoked sooner.    Influenza A by PCR NEGATIVE NEGATIVE Final   Influenza B by PCR NEGATIVE NEGATIVE Final    Comment: (NOTE) The Xpert Xpress SARS-CoV-2/FLU/RSV assay is intended as an aid in  the diagnosis of influenza from Nasopharyngeal swab specimens and  should not be used as a sole basis for treatment.  Nasal washings and  aspirates are unacceptable for Xpert Xpress SARS-CoV-2/FLU/RSV  testing. Fact Sheet for Patients: PinkCheek.be Fact Sheet for Healthcare Providers: GravelBags.it This test is not yet approved or cleared by the [place] FDA and  has been authorized for detection and/or diagnosis of SARS-CoV-2 by  FDA under an Emergency Use Authorization (  EUA). This EUA will remain  in effect (meaning this test can be used) for the duration of the  Covid-19 declaration under Section 564(b)(1) of the Act, 21  U.S.C. section 360bbb-3(b)(1), unless the authorization is  terminated or revoked. Performed at [facility], [address], [place], [place] [address]   Urine Culture     Status: Abnormal (Preliminary result)   Collection Time: 05/10/19  9:36 PM   Specimen: Urine, Random  Result Value Ref Range Status   Specimen Description   Final    URINE, RANDOM Performed at [facility], [address], [place], [place] [address]    Special Requests   Final    NONE Performed at [facility], [address] [place], [place] [address]    Culture >=100,000 COLONIES/mL ESCHERICHIA COLI (A)  Final   Report Status PENDING  Incomplete         Radiology Studies: DG Chest 1 View  Result Date: 05/10/2019 CLINICAL DATA:  Altered mental status EXAM: CHEST  1 VIEW COMPARISON:  04/08/2017 FINDINGS: Mild cardiomegaly. No acute consolidation or effusion. Aortic atherosclerosis. No pneumothorax. IMPRESSION: No active disease.  Mild cardiomegaly. Electronically Signed   By: Jasmine Pang M.D.   On: 05/10/2019 22:00   US RENAL  Result Date: 05/11/2019 CLINICAL DATA:  Acute renal injury EXAM: RENAL / URINARY TRACT ULTRASOUND COMPLETE COMPARISON:  03/01/2015 FINDINGS: Right Kidney: Renal measurements: 13.1 x 6.0 x 5.0 cm. = volume: 207 mL. Diffuse increased echogenicity is noted. Multiple cysts are noted  throughout the right kidney. The largest of these measures 6.1 cm. Greater than 10 cysts are noted. Left Kidney: Renal measurements: 12.0 x 6.3 x 5.3 cm. = volume: 212 mL. Diffuse increased echogenicity is noted. Multiple cysts are noted. The largest of these measures 6.8 cm. Greater than 10 cysts are noted. Bladder: Decompressed by Foley catheter. Other: None. IMPRESSION: Bilateral renal cysts. Increased echogenicity consistent with medical renal disease. Electronically Signed   By: Alcide Clever M.D.   On: 05/11/2019 15:20        Scheduled Meds: . acidophilus  1 capsule Oral BID  . atorvastatin  80 mg Oral QPM  . carvedilol  6.25 mg Oral BID  . Chlorhexidine Gluconate Cloth  6 each Topical Daily  . diltiazem  120 mg Oral QPM  . feeding supplement (NEPRO CARB STEADY)  237 mL Oral TID BM  . multivitamin with minerals  1 tablet Oral Daily   Continuous Infusions: . sodium chloride    . sodium chloride 125 mL/hr at 05/12/19 1009  . sodium chloride    . sodium chloride    . meropenem (MERREM) IV 500 mg (05/12/19 1246)  . pantoprozole (PROTONIX) infusion 8 mg/hr (05/12/19 1010)     LOS: 2 days    Time spent: 35 minutes    Tresa Moore, MD [facility] Pager [phone]  If 7PM-7AM, please contact night-coverage  05/12/2019, 2:34 PM

## 2019-05-12 NOTE — Progress Notes (Signed)
Jordan Kline , MD [address], [place], [place], [address] [address], [address], [place], [place], [address] Phone: [phone]  Fax: [phone]   Jordan Kline is being followed for melena  Day 1 of follow up   Subjective: No new concerns    Objective: Vital signs in last 24 hours: Vitals:   05/12/19 0540 05/12/19 1022 05/12/19 1140 05/12/19 1145  BP: 120/89 127/78 121/69 121/68  Pulse: (!) 108 (!) 109 69 72  Resp: 20  (!) 24 (!) 22  Temp: 97.6 F (36.4 C) 97.7 F (36.5 C) (!) 97.5 F (36.4 C) 97.7 F (36.5 C)  TempSrc: Oral Oral Oral Oral  SpO2: 100% 100% 96% 99%  Weight:      Height:       Weight change: -0.067 kg  Intake/Output Summary (Last 24 hours) at 05/12/2019 1320 Last data filed at 05/12/2019 7106 Gross per 24 hour  Intake 2879.51 ml  Output 850 ml  Net 2029.51 ml     Exam: Heart:: Regular rate and rhythm, S1S2 present or without murmur or extra heart sounds Lungs: normal, clear to auscultation and clear to auscultation and percussion Abdomen: soft, nontender, normal bowel sounds   Lab Results: @LABTEST2 @ Micro Results: Recent Results (from the past 240 hour(s))  Culture, blood (Routine x 2)     Status: None (Preliminary result)   Collection Time: 05/10/19  9:35 PM   Specimen: BLOOD  Result Value Ref Range Status   Specimen Description   Final    BLOOD RIGHT FOREARM Performed at [facility], [address] [place], [place]    Special Requests   Final    BOTTLES DRAWN AEROBIC AND ANAEROBIC Blood Culture adequate volume Performed at [facility], [address] [place], [place]    Culture  Setup Time   Final    GRAM NEGATIVE RODS IN BOTH AEROBIC AND ANAEROBIC BOTTLES CRITICAL RESULT CALLED TO, READ BACK BY AND VERIFIED WITH:  JODY BAREFOOT ON 05/11/2019 AT 0939 TIK    Culture   Final    GRAM NEGATIVE RODS CULTURE REINCUBATED FOR BETTER GROWTH Performed at [facility], [address], [place], [place]    Report Status PENDING  Incomplete  Blood Culture ID Panel (Reflexed)     Status: Abnormal   Collection Time: 05/10/19  9:35 PM  Result Value Ref Range Status   Enterococcus species NOT DETECTED NOT DETECTED Final   Listeria monocytogenes NOT DETECTED NOT DETECTED Final   Staphylococcus species NOT DETECTED NOT DETECTED Final   Staphylococcus aureus (BCID) NOT DETECTED NOT DETECTED Final   Streptococcus species NOT DETECTED NOT DETECTED Final   Streptococcus agalactiae NOT DETECTED NOT DETECTED Final   Streptococcus pneumoniae NOT DETECTED NOT DETECTED Final   Streptococcus pyogenes NOT DETECTED NOT DETECTED Final   Acinetobacter baumannii NOT DETECTED NOT DETECTED Final   Enterobacteriaceae species DETECTED (A) NOT DETECTED Final    Comment: Enterobacteriaceae represent a large family of gram-negative bacteria, not a single organism. CRITICAL RESULT CALLED TO, READ BACK BY AND VERIFIED WITH:  JODY BAREFOOT ON 05/11/2019 AT 0939 TIK    Enterobacter cloacae complex NOT DETECTED NOT DETECTED Final   Escherichia coli DETECTED (A) NOT DETECTED Final    Comment: CRITICAL RESULT CALLED TO, READ BACK BY AND VERIFIED WITH: JODY BAREFOOT ON 05/11/2019 AT 0939 TIK    Klebsiella oxytoca NOT DETECTED NOT DETECTED Final   Klebsiella pneumoniae NOT DETECTED NOT DETECTED  Final   Proteus species NOT DETECTED NOT DETECTED Final   Serratia marcescens NOT DETECTED NOT DETECTED Final   Carbapenem resistance NOT DETECTED NOT DETECTED Final   Haemophilus influenzae NOT DETECTED NOT DETECTED Final   Neisseria meningitidis NOT DETECTED NOT DETECTED Final   Pseudomonas aeruginosa NOT DETECTED NOT DETECTED Final   Candida albicans NOT DETECTED NOT DETECTED Final   Candida glabrata NOT DETECTED NOT DETECTED Final   Candida krusei NOT DETECTED NOT DETECTED Final   Candida parapsilosis NOT DETECTED NOT DETECTED Final   Candida tropicalis NOT DETECTED NOT DETECTED  Final    Comment: Performed at [facility], [address] [place], [place] [address]  Culture, blood (Routine x 2)     Status: Abnormal (Preliminary result)   Collection Time: 05/10/19  9:36 PM   Specimen: BLOOD  Result Value Ref Range Status   Specimen Description   Final    BLOOD RIGHT ANTECUBITAL Performed at [facility], [address], [place], [place] [address]    Special Requests   Final    BOTTLES DRAWN AEROBIC AND ANAEROBIC Blood Culture adequate volume Performed at [facility], [address] [place], [place] [address]    Culture  Setup Time   Final    GRAM NEGATIVE RODS IN BOTH AEROBIC AND ANAEROBIC BOTTLES CRITICAL RESULT CALLED TO, READ BACK BY AND VERIFIED WITH: JODY BAREFOOT ON 05/11/2019 AT 1962 TIK Performed at [facility], [address], [place], [place] [address]    Culture (A)  Final    ESCHERICHIA COLI SUSCEPTIBILITIES TO FOLLOW Performed at [facility], [address] [place], [place] [address]    Report Status PENDING  Incomplete  Respiratory Panel by RT PCR (Flu A&B, Covid) - Nasopharyngeal Swab     Status: None   Collection Time: 05/10/19  9:36 PM   Specimen: Nasopharyngeal Swab  Result Value Ref Range Status   SARS Coronavirus 2 by RT PCR NEGATIVE NEGATIVE Final    Comment: (NOTE) SARS-CoV-2 target nucleic acids are NOT DETECTED. The SARS-CoV-2 RNA is generally detectable in upper respiratoy specimens during the acute phase of infection. The lowest concentration of SARS-CoV-2 viral copies this assay can detect is 131 copies/mL. A negative result does not preclude SARS-Cov-2 infection and should not be used as the sole basis for treatment or other patient management decisions. A negative result may occur with  improper specimen collection/handling, submission of specimen other than nasopharyngeal swab, presence of viral mutation(s) within the areas targeted by this assay, and inadequate number  of viral copies (<131 copies/mL). A negative result must be combined with clinical observations, patient history, and epidemiological information. The expected result is Negative. Fact Sheet for Patients:  https://www.moore.com/ Fact Sheet for Healthcare Providers:  https://www.young.biz/ This test is not yet ap proved or cleared by the [place] FDA and  has been authorized for detection and/or diagnosis of SARS-CoV-2 by FDA under an Emergency Use Authorization (EUA). This EUA will remain  in effect (meaning this test can be used) for the duration of the COVID-19 declaration under Section 564(b)(1) of the Act, 21 U.S.C. section 360bbb-3(b)(1), unless the authorization is terminated or revoked sooner.    Influenza A by PCR NEGATIVE NEGATIVE Final   Influenza B by PCR NEGATIVE NEGATIVE Final    Comment: (NOTE) The Xpert Xpress SARS-CoV-2/FLU/RSV assay is intended as an aid in  the diagnosis of influenza from Nasopharyngeal swab specimens and  should not be used as a sole  basis for treatment. Nasal washings and  aspirates are unacceptable for Xpert Xpress SARS-CoV-2/FLU/RSV  testing. Fact Sheet for Patients: PinkCheek.be Fact Sheet for Healthcare Providers: GravelBags.it This test is not yet approved or cleared by the [place] FDA and  has been authorized for detection and/or diagnosis of SARS-CoV-2 by  FDA under an Emergency Use Authorization (EUA). This EUA will remain  in effect (meaning this test can be used) for the duration of the  Covid-19 declaration under Section 564(b)(1) of the Act, 21  U.S.C. section 360bbb-3(b)(1), unless the authorization is  terminated or revoked. Performed at [facility], [address] [place], [place] [address]   Urine Culture     Status: Abnormal (Preliminary result)   Collection Time: 05/10/19  9:36 PM   Specimen: Urine, Random    Result Value Ref Range Status   Specimen Description   Final    URINE, RANDOM Performed at [facility], [address], [place], [place] [address]    Special Requests   Final    NONE Performed at [facility], [address] [place], [place] [address]    Culture >=100,000 COLONIES/mL ESCHERICHIA COLI (A)  Final   Report Status PENDING  Incomplete   Studies/Results: DG Chest 1 View  Result Date: 05/10/2019 CLINICAL DATA:  Altered mental status EXAM: CHEST  1 VIEW COMPARISON:  04/08/2017 FINDINGS: Mild cardiomegaly. No acute consolidation or effusion. Aortic atherosclerosis. No pneumothorax. IMPRESSION: No active disease.  Mild cardiomegaly. Electronically Signed   By: Donavan Foil M.D.   On: 05/10/2019 22:00   US RENAL  Result Date: 05/11/2019 CLINICAL DATA:  Acute renal injury EXAM: RENAL / URINARY TRACT ULTRASOUND COMPLETE COMPARISON:  03/01/2015 FINDINGS: Right Kidney: Renal measurements: 13.1 x 6.0 x 5.0 cm. = volume: 207 mL. Diffuse increased echogenicity is noted. Multiple cysts are noted throughout the right kidney. The largest of these measures 6.1 cm. Greater than 10 cysts are noted. Left Kidney: Renal measurements: 12.0 x 6.3 x 5.3 cm. = volume: 212 mL. Diffuse increased echogenicity is noted. Multiple cysts are noted. The largest of these measures 6.8 cm. Greater than 10 cysts are noted. Bladder: Decompressed by Foley catheter. Other: None. IMPRESSION: Bilateral renal cysts. Increased echogenicity consistent with medical renal disease. Electronically Signed   By: Inez Catalina M.D.   On: 05/11/2019 15:20   Medications: I have reviewed the patient's current medications. Scheduled Meds: . acidophilus  1 capsule Oral BID  . atorvastatin  80 mg Oral QPM  . carvedilol  6.25 mg Oral BID  . Chlorhexidine Gluconate Cloth  6 each Topical Daily  . diltiazem  120 mg Oral QPM  . feeding supplement (NEPRO CARB STEADY)  237 mL Oral TID BM  . multivitamin with minerals   1 tablet Oral Daily   Continuous Infusions: . sodium chloride    . sodium chloride 125 mL/hr at 05/12/19 1009  . sodium chloride    . sodium chloride    . meropenem (MERREM) IV 500 mg (05/12/19 1246)  . pantoprozole (PROTONIX) infusion 8 mg/hr (05/12/19 1010)   PRN Meds:.acetaminophen **OR** acetaminophen   Assessment: Active Problems:   Sepsis due to gram-negative UTI (HCC)   Sepsis secondary to UTI ([place])   Pressure injury of skin  [facility] 86 y.o. male admitted with melena.  Hemoglobin has been stable since yesterday.  Has been on Eliquis.  History of AKI.  Status post 2 units transfusion of PRBCs. 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 Plan: EGD on 05/14/2019 as previously scheduled. Continue PPI and monitor CBC.  Transfuse as needed.   LOS: 2 days   Jordan Mood, MD 05/12/2019, 1:20 PM

## 2019-05-12 NOTE — Progress Notes (Signed)
Initial Nutrition Assessment  DOCUMENTATION CODES:   Severe malnutrition in context of chronic illness  INTERVENTION:   Nepro Shake po BID, each supplement provides 425 kcal and 19 grams protein  Magic cup TID with meals, each supplement provides 290 kcal and 9 grams of protein  MVI daily   NUTRITION DIAGNOSIS:   Severe Malnutrition related to chronic illness(h/o stroke, advanced age) as evidenced by severe muscle depletion, moderate to severe fat depletions.  GOAL:   Patient will meet greater than or equal to 90% of their needs  MONITOR:   PO intake, Supplement acceptance, Labs, Weight trends, Skin, I & O's  REASON FOR ASSESSMENT:   Malnutrition Screening Tool    ASSESSMENT:   86 y.o. African-American male with a known history of hypertension, dyslipidemia, CVA and chronic indwelling Foley catheter, as well as atrial fibrillation, who presented to the emergency room with acute onset of altered mental status. Pt found to have UTI with sepsis and GIB   Met with pt in room today. Pt is a poor historian but reports poor appetite and oral intake pta and currently. Pt documented to be eating only sips and bites of meals in hospital. RD will add supplements and MVI to help pt meet his estimated needs. Per chart, pt with weight gain pta. Plan is for EGD on 05/14/2019.   Medications reviewed and include: risaquad, NaCl '@125ml' /hr, meropenem, protonix  Labs reviewed: Na 146(H), BUN 113(H), creat 3.51(H) Wbc- 18.5(H), Hgb 10.2(L), Hct 31.4(L)  NUTRITION - FOCUSED PHYSICAL EXAM:    Most Recent Value  Orbital Region  Mild depletion  Upper Arm Region  Severe depletion  Thoracic and Lumbar Region  Severe depletion  Buccal Region  Moderate depletion  Temple Region  Moderate depletion  Clavicle Bone Region  Severe depletion  Clavicle and Acromion Bone Region  Severe depletion  Scapular Bone Region  Severe depletion  Dorsal Hand  Severe depletion  Patellar Region  Severe depletion   Anterior Thigh Region  Severe depletion  Posterior Calf Region  Severe depletion  Edema (RD Assessment)  None  Hair  Reviewed  Eyes  Reviewed  Mouth  Reviewed  Skin  Reviewed  Nails  Reviewed     Diet Order:   Diet Order            DIET - DYS 1 Room service appropriate? Yes with Assist; Fluid consistency: Nectar Thick  Diet effective now             EDUCATION NEEDS:   Education needs have been addressed  Skin:  Skin Assessment: Reviewed RN Assessment(Stage I buttocks)  Last BM:  2/14- TYPE 5  Height:   Ht Readings from Last 1 Encounters:  05/11/19 '5\' 8"'  (1.727 m)    Weight:   Wt Readings from Last 1 Encounters:  05/11/19 58.9 kg    Ideal Body Weight:  70 kg  BMI:  Body mass index is 19.74 kg/m.  Estimated Nutritional Needs:   Kcal:  1700-1900kcal/day  Protein:  85-95g/day  Fluid:  >1.5L/day  Koleen Distance MS, RD, LDN Contact information available in Amion

## 2019-05-12 NOTE — Progress Notes (Signed)
   05/12/19 1400  Clinical Encounter Type  Visited With Patient  Visit Type Initial  Referral From Chaplain  Consult/Referral To Chaplain  Chaplain briefly visited with patient. He was being fed Yogurt, so not to interrupt, Chaplain left.

## 2019-05-12 NOTE — Evaluation (Signed)
Clinical/Bedside Swallow Evaluation Patient Details  Name: Jordan Kline MRN: [id number] Date of Birth: 07-03-1927  Today's Date: 05/12/2019 Time: SLP Start Time (ACUTE ONLY): 3500 SLP Stop Time (ACUTE ONLY): 1430 SLP Time Calculation (min) (ACUTE ONLY): 45 min  Past Medical History:  Past Medical History:  Diagnosis Date  . A-fib ([place])    On Xarelto  . Chronic kidney disease (CKD)   . High cholesterol   . Hypertension   . Inguinal hernia   . Stroke [facility])    With left-sided hemiparesis  . Urinary retention    Chronic Foley catheter since December 2016   Past Surgical History:  Past Surgical History:  Procedure Laterality Date  . APPENDECTOMY    . PERIPHERAL VASCULAR CATHETERIZATION N/A 11/10/2015   Procedure: Visceral Angiography, mesenteric angio;  Surgeon: Algernon Huxley, MD;  Location: [place] [facility];  Service: Cardiovascular;  Laterality: N/A;  . PERIPHERAL VASCULAR CATHETERIZATION N/A 11/10/2015   Procedure: Visceral Artery Intervention;  Surgeon: Algernon Huxley, MD;  Location: [place] [facility];  Service: Cardiovascular;  Laterality: N/A;   HPI:  Pt is a 86 y.o. African-American male with a known history of hypertension, dyslipidemia, CVA w/ Left sided weakness, and chronic indwelling Foley catheter, as well as atrial fibrillation, who presented to the emergency room with acute onset of altered mental status.  Per his wife he had his indwelling Foley catheter replaced a few days ago and was noted to be altered more than usual since yesterday.  He had similar presentation with previous UTIs.  Admitting dx: Sepsis and metabolic encephalopathy likely secondary to urinary tract infection.  Pt is dependent for ADLs at home and followed by Palliative Care services at home.    Assessment / Plan / Recommendation Clinical Impression  Pt appears to present w/ overt s/s oropharyngeal phase dysphagia w/ increased risk for aspiration thus Pulmonary decline. Pt has a baseline of old  CVA w/ left sided weakness including oral-labial weakness. He also has poor dentition status at baseline. Pt required feeding support but was able to help hold cup to drink. Pt exhibited overt clinical s/s of aspiration w/ trials of ice chips; prolonged bolus transfer and clearing during the oral phase. Similar oral phase deficits noted w/ trials of Nectar consistency liquids, however, No overt clinical s/s of aspiration. Oral phase c/b decreased lingual coordination and movement to achieve timely A-P transfer w/ each trial; intermittent oral holding/delay. Given time and verbal cues intermittently, pt swallowed and cleared orally. He often opened his mouth(automatically) for SLP to check for oral clearing. Recommend a dysphagia level 1 (puree) diet continue w/ Nectar consistency liquids; aspiration precautions; feeding support; Pills Crushed in Puree. Will f/u w/ trials and potential need for objective swallow study(MBSS) next 1-2 days while admitted. NSG updated.  SLP Visit Diagnosis: Dysphagia, oropharyngeal phase (R13.12)    Aspiration Risk  Risk for inadequate nutrition/hydration;Mild aspiration risk;Moderate aspiration risk    Diet Recommendation  Dysphagia level 1 (puree) w/ Nectar liquids; general aspiration precautions; feeding support at all meals  Medication Administration: Crushed with puree(for safer swallowing)    Other  Recommendations Recommended Consults: (Dietician f/u; Palliative Care following) Oral Care Recommendations: Oral care BID;Oral care before and after PO;Staff/trained caregiver to provide oral care Other Recommendations: Order thickener from pharmacy;Prohibited food (jello, ice cream, thin soups);Remove water pitcher;Have oral suction available   Follow up Recommendations (TBD)      Frequency and Duration min 3x week  2 weeks  Prognosis Prognosis for Safe Diet Advancement: Fair Barriers to Reach Goals: Cognitive deficits;Time post onset;Severity of  deficits;Language deficits Barriers/Prognosis Comment: old CVA w/ left sided weakness      Swallow Study   General Date of Onset: 05/10/19 HPI: Pt is a 86 y.o. African-American male with a known history of hypertension, dyslipidemia, CVA w/ Left sided weakness, and chronic indwelling Foley catheter, as well as atrial fibrillation, who presented to the emergency room with acute onset of altered mental status.  Per his wife he had his indwelling Foley catheter replaced a few days ago and was noted to be altered more than usual since yesterday.  He had similar presentation with previous UTIs.  Admitting dx: Sepsis and metabolic encephalopathy likely secondary to urinary tract infection.  Pt is dependent for ADLs at home and followed by Palliative Care services at home.  Type of Study: Bedside Swallow Evaluation Previous Swallow Assessment: none noted in chart Diet Prior to this Study: Dysphagia 1 (puree);Nectar-thick liquids Temperature Spikes Noted: No(wbc 18.5 declining since admit) Respiratory Status: Room air History of Recent Intubation: No Behavior/Cognition: Alert;Cooperative;Pleasant mood;Distractible;Requires cueing Oral Cavity Assessment: Within Functional Limits(grossly) Oral Care Completed by SLP: Yes Oral Cavity - Dentition: Poor condition;Missing dentition Vision: Functional for self-feeding Self-Feeding Abilities: Needs assist;Needs set up;Total assist(able to hold cup) Patient Positioning: Upright in bed(needed full positioning) Baseline Vocal Quality: Normal(expressive deficits - min+) Volitional Cough: Cognitively unable to elicit Volitional Swallow: Unable to elicit    Oral/Motor/Sensory Function Overall Oral Motor/Sensory Function: Mild impairment Facial ROM: Reduced left Facial Symmetry: Abnormal symmetry left Facial Strength: Reduced left Lingual ROM: Within Functional Limits(grossly) Lingual Symmetry: Within Functional Limits(grossly) Lingual Strength: Within  Functional Limits(grossly) Velum: Within Functional Limits Mandible: Within Functional Limits   Ice Chips Ice chips: Impaired Presentation: Spoon(fed; 3 trials) Oral Phase Impairments: Reduced lingual movement/coordination;Poor awareness of bolus Oral Phase Functional Implications: Prolonged oral transit Pharyngeal Phase Impairments: Throat Clearing - Delayed(x2/3 trials)   Thin Liquid Thin Liquid: Not tested Other Comments: d/t presentation w/ ice chips    Nectar Thick Nectar Thick Liquid: Impaired Presentation: Cup;Spoon;Straw(assisted w/ cup holding; ~3 ozs total) Oral Phase Impairments: Reduced lingual movement/coordination Oral phase functional implications: Prolonged oral transit;Oral holding(min; inconsistent) Pharyngeal Phase Impairments: (no overt s/s noted)   Honey Thick Honey Thick Liquid: Not tested   Puree Puree: Impaired Presentation: Spoon(fed; 10 trials) Oral Phase Impairments: Reduced lingual movement/coordination Oral Phase Functional Implications: Prolonged oral transit Pharyngeal Phase Impairments: (no overt s/s noted)   Solid     Solid: Not tested Other Comments: poor Dentition status       Jerilynn Som, MS, CCC-SLP Domini Vandehei 05/12/2019,5:45 PM

## 2019-05-13 ENCOUNTER — Inpatient Hospital Stay: Payer: Medicare HMO

## 2019-05-13 LAB — CULTURE, BLOOD (ROUTINE X 2)
Special Requests: ADEQUATE
Special Requests: ADEQUATE

## 2019-05-13 LAB — BASIC METABOLIC PANEL
Anion gap: 7 (ref 5–15)
Anion gap: 4 — ABNORMAL LOW (ref 5–15)
BUN: 109 mg/dL — ABNORMAL HIGH (ref 8–23)
BUN: 101 mg/dL — ABNORMAL HIGH (ref 8–23)
CO2: 17 mmol/L — ABNORMAL LOW (ref 22–32)
CO2: 19 mmol/L — ABNORMAL LOW (ref 22–32)
Calcium: 7.6 mg/dL — ABNORMAL LOW (ref 8.9–10.3)
Calcium: 7.9 mg/dL — ABNORMAL LOW (ref 8.9–10.3)
Chloride: 128 mmol/L — ABNORMAL HIGH (ref 98–111)
Chloride: 128 mmol/L — ABNORMAL HIGH (ref 98–111)
Creatinine, Ser: 3.36 mg/dL — ABNORMAL HIGH (ref 0.61–1.24)
Creatinine, Ser: 3.33 mg/dL — ABNORMAL HIGH (ref 0.61–1.24)
GFR calc Af Amer: 18 mL/min — ABNORMAL LOW (ref >60)
GFR calc Af Amer: 18 mL/min — ABNORMAL LOW (ref >60)
GFR calc non Af Amer: 15 mL/min — ABNORMAL LOW (ref >60)
GFR calc non Af Amer: 15 mL/min — ABNORMAL LOW (ref >60)
Glucose, Bld: 130 mg/dL — ABNORMAL HIGH (ref 70–99)
Glucose, Bld: 177 mg/dL — ABNORMAL HIGH (ref 70–99)
Potassium: 4.1 mmol/L (ref 3.5–5.1)
Potassium: 4.2 mmol/L (ref 3.5–5.1)
Sodium: 152 mmol/L — ABNORMAL HIGH (ref 135–145)
Sodium: 151 mmol/L — ABNORMAL HIGH (ref 135–145)

## 2019-05-13 LAB — URINE CULTURE: Culture: >=100000 — AB

## 2019-05-13 LAB — CBC WITH DIFFERENTIAL/PLATELET
Abs Immature Granulocytes: 0.24 10*3/uL — ABNORMAL HIGH (ref 0.00–0.07)
Basophils Absolute: 0 10*3/uL (ref 0.0–0.1)
Basophils Relative: 0 %
Eosinophils Absolute: 0.1 10*3/uL (ref 0.0–0.5)
Eosinophils Relative: 0 %
HCT: 29.5 % — ABNORMAL LOW (ref 39.0–52.0)
Hemoglobin: 9.8 g/dL — ABNORMAL LOW (ref 13.0–17.0)
Immature Granulocytes: 1 %
Lymphocytes Relative: 3 %
Lymphs Abs: 0.5 10*3/uL — ABNORMAL LOW (ref 0.7–4.0)
MCH: 31.7 pg (ref 26.0–34.0)
MCHC: 33.2 g/dL (ref 30.0–36.0)
MCV: 95.5 fL (ref 80.0–100.0)
Monocytes Absolute: 1.6 10*3/uL — ABNORMAL HIGH (ref 0.1–1.0)
Monocytes Relative: 8 %
Neutro Abs: 17.1 10*3/uL — ABNORMAL HIGH (ref 1.7–7.7)
Neutrophils Relative %: 88 %
Platelets: 84 10*3/uL — ABNORMAL LOW (ref 150–400)
RBC: 3.09 MIL/uL — ABNORMAL LOW (ref 4.22–5.81)
RDW: 18.2 % — ABNORMAL HIGH (ref 11.5–15.5)
WBC: 19.5 10*3/uL — ABNORMAL HIGH (ref 4.0–10.5)
nRBC: 0 % (ref 0.0–0.2)

## 2019-05-13 MED ORDER — FUROSEMIDE 10 MG/ML IJ SOLN
40.0000 mg | Freq: Once | INTRAMUSCULAR | Status: AC
  Administered 2019-05-13: 40 mg via INTRAVENOUS
  Filled 2019-05-13: qty 4

## 2019-05-13 MED ORDER — SODIUM CHLORIDE 0.45 % IV SOLN: INTRAVENOUS | Status: DC

## 2019-05-13 MED ORDER — METHYLPREDNISOLONE SODIUM SUCC 125 MG IJ SOLR
125.0000 mg | Freq: Once | INTRAMUSCULAR | Status: AC
  Administered 2019-05-13: 125 mg via INTRAVENOUS
  Filled 2019-05-13: qty 2

## 2019-05-13 MED ORDER — SODIUM CHLORIDE 0.9 % IV SOLN: INTRAVENOUS | Status: DC

## 2019-05-13 MED ORDER — CEFAZOLIN SODIUM-DEXTROSE 1-4 GM/50ML-% IV SOLN
1.0000 g | Freq: Two times a day (BID) | INTRAVENOUS | Status: DC
  Administered 2019-05-13 – 2019-05-17 (×7): 1 g via INTRAVENOUS
  Filled 2019-05-13 (×11): qty 50

## 2019-05-13 MED ORDER — IPRATROPIUM-ALBUTEROL 0.5-2.5 (3) MG/3ML IN SOLN
3.0000 mL | RESPIRATORY_TRACT | Status: DC | PRN
  Administered 2019-05-13 – 2019-05-15 (×3): 3 mL via RESPIRATORY_TRACT
  Filled 2019-05-13 (×3): qty 3

## 2019-05-13 NOTE — Care Management Important Message (Signed)
Important Message  Patient Details  Name: Jordan Kline MRN: [id number] Date of Birth: [birth year]   Medicare Important Message Given:  Yes     [name] 05/13/2019, 10:40 AM

## 2019-05-13 NOTE — Progress Notes (Addendum)
Upon entering room patient was found to have labored breathing and inspiratory wheezing. Patient currently on 2L [place] acute, O2 Sats 98%. Patient alert and responding to commands. Per Dr. Kristine Linea, administer PRN NEB Tx, hold maintenance fluids for now, Stat CXR ordered.   Madie Reno, RN

## 2019-05-13 NOTE — Progress Notes (Signed)
Patient and wife refused to sign consent. Per patient's wife, patient has questions regarding procedure and would like to speak with the physician. Dr. Sharlet Salina notified. Per Dr. Sharlet Salina will speak with family tomorrow. Hold off keeping patient NPO after midnight.   Madie Reno, RN

## 2019-05-13 NOTE — TOC Initial Note (Signed)
Transition of Care [facility]) - Initial/Assessment Note    Patient Details  Name: Jordan Kline MRN: [id number] Date of Birth: [birth year]  [facility]) CM/SW Contact:    Jordan Ivan, LCSW Phone Number: 05/13/2019, 1:23 PM  Clinical Narrative:     CSW met with patient at bedside. Patient was alert and oriented to self and place. Patient deferred to his wife for answers for readmission screen. CSW called patient's wife, Jordan Kline. Explained CSW role. She stated she provides care to patient in the home where patient remains in bed most of the time. [name] reported they have a son who is involved and supportive. Son visits daily. [name] reported patient currently receives RN services through [facility]. CSW left message for Jordan Kline with [facility] to confirm they can provide services to patient upon discharge. Patient uses [facility] on [address] and Jordan Kline denied issues with obtaining medications. [name] reported patient has DME in the home, but does not use it due to remaining in the bed. [name] and patient's son provide transport as needed. PCP is Dr. Elijio Kline at [facility], request submitted for Nurse Secretary to schedule appointment for 3-5 days following discharge. Encouraged patient and Jordan Kline to reach out with any needs. CSW will continue to follow.    Expected Discharge Plan: [place] Barriers to Discharge: Continued Medical Work up   Patient Goals and CMS Choice        Expected Discharge Plan and Services Expected Discharge Plan: [place]   Discharge Planning Services: CM Consult Post Acute Care Choice: [place] arrangements for the past 2 months: Single Family Home                                      Prior Living Arrangements/Services Living arrangements for the past 2 months: Single Family Home Lives with:: Spouse Patient language and need for interpreter reviewed:: Yes Do you  feel safe going back to the place where you live?: Yes      Need for Family Participation in Patient Care: Yes (Comment) Care giver support system in place?: Yes (comment) Current home services: DME, Home RN Criminal Activity/Legal Involvement Pertinent to Current Situation/Hospitalization: No - Comment as needed  Activities of Daily Living Home Assistive Devices/Equipment: Other (Comment) ADL Screening (condition at time of admission) Patient's cognitive ability adequate to safely complete daily activities?: No Is the patient deaf or have difficulty hearing?: No Does the patient have difficulty seeing, even when wearing glasses/contacts?: No Does the patient have difficulty concentrating, remembering, or making decisions?: Yes Patient able to express need for assistance with ADLs?: Yes Does the patient have difficulty dressing or bathing?: Yes Independently performs ADLs?: No Communication: Needs assistance Is this a change from baseline?: Pre-admission baseline Dressing (OT): Needs assistance Is this a change from baseline?: Pre-admission baseline Grooming: Needs assistance Is this a change from baseline?: Pre-admission baseline Feeding: Needs assistance Is this a change from baseline?: Pre-admission baseline Bathing: Needs assistance Is this a change from baseline?: Pre-admission baseline Toileting: Needs assistance Is this a change from baseline?: Pre-admission baseline In/Out Bed: Dependent Is this a change from baseline?: Pre-admission baseline Walks in Home: Dependent Is this a change from baseline?: Pre-admission baseline Does the patient have difficulty walking or climbing stairs?: Yes Weakness of Legs: Left Weakness of Arms/Hands: Left  Permission Sought/Granted  Share Information with NAME: Jordan Kline           Emotional Assessment   Attitude/Demeanor/Rapport: Engaged Affect (typically observed): Calm Orientation: : Oriented to Self, Oriented to  Place Alcohol / Substance Use: Not Applicable Psych Involvement: No (comment)  Admission diagnosis:  Lower urinary tract infectious disease [N39.0] Pain [R52] AKI (acute kidney injury) ([place]) [N17.9] Sepsis secondary to UTI ([place]) [A41.9, N39.0] Gastrointestinal hemorrhage with melena [K92.1] Altered mental status, unspecified altered mental status type [R41.82] Anemia, unspecified type [D64.9] Sepsis due to gram-negative UTI ([place]) [A41.50, N39.0] Sepsis, due to unspecified organism, unspecified whether acute organ dysfunction present [facility]) [A41.9] Patient Active Problem List   Diagnosis Date Noted  . Sepsis secondary to UTI ([place]) 05/11/2019  . Pressure injury of skin 05/11/2019  . Sepsis due to gram-negative UTI ([place]) 05/10/2019  . Anorexia 07/08/2018  . Debility 07/08/2018  . Clostridium difficile colitis 05/29/2017  . Skin erosion 05/29/2017  . Knee effusion, right 05/29/2017  . Goals of care, counseling/discussion   . Palliative care encounter   . Sepsis ([place]) 04/08/2017  . Protein-calorie malnutrition, severe 11/09/2015  . GIB (gastrointestinal bleeding) 11/08/2015  . Recurrent UTI 07/12/2015  . UTI (lower urinary tract infection) 03/01/2015  . Urinary retention 03/01/2015   PCP:  Jordan Marble, MD Pharmacy[facility]   [id number] [place] [id number]: [phone]: [phone]    Social Determinants of Health (SDOH) Interventions    Readmission Risk Interventions Readmission Risk Prevention Plan 05/13/2019  Transportation Screening Complete  PCP or Specialist Appt within 5-7 Days Complete  Home Care Screening Complete  Medication Review (RN CM) Not Complete  Med Review comments Not completed yet.  Some recent data might be hidden

## 2019-05-13 NOTE — Significant Event (Signed)
Received call from bedside nurse stating that patient had some respiratory distress and audible wheeze.  Per RT some throat inflammation was noted.  Concern for possible allergic reaction.  Responded to bedside.  Found the patient to be in no visible distress, no accessory muscle use.  Audible wheeze noted.  Patient is on 2 L supplemental oxygen.  Stat chest x-ray ordered and reviewed.  Mild pulmonary vascular congestion.  Suspect mild fluid overload secondary to IV fluid administration.  Will hold IV fluids at this time.  Give 125 mg IV Solu-Medrol x1, 40 mg IV Lasix x1.  Avoid IV fluid administration if possible.  Discussed plan of care with patient's wife at bedside.  Patient is not on any her home diuretics but is on home Entresto which is on hold currently given relative hypotension.  For now we will continue to hold [facility] however consider starting medication tomorrow if blood pressure allows.  Lolita Patella MD

## 2019-05-13 NOTE — Progress Notes (Signed)
PROGRESS NOTE    Jordan Kline  QQP:[id number] DOB: [birth year] DOA: 05/10/2019 PCP: Sherron Monday, MD   Brief Narrative:  Jordan Kline  is a 86 y.o. African-American male with a known history of hypertension, dyslipidemia, CVA and chronic indwelling Foley catheter, as well as atrial fibrillation, who presented to the emergency room with acute onset of altered mental status.  Per his wife he had his indwelling Foley catheter replaced a few days ago and was noted to be altered more than usual since yesterday.  He had similar presentation with previous UTIs.  No reported nausea or vomiting or abdominal pain.  He was noted to have melena in the ER.  No reported fever or chills.  The patient stated that he was not feeling too good but otherwise has not answered any other questions.  Upon presentation to the emergency room, vital signs revealed blood pressure of 78/53 with a pulse of 117 and respirate 33 with pulse oximetry 100% on room air attention 98.7.  Labs revealed BUN of 122 with a creatinine of 4.44 compared to 66 and 1.58, lactic acid of 2 and CBC with leukocytosis 12.3 with neutrophilia as well as anemia with hemoglobin of 7 hematocrit 23.4 compared to 10 and 29.2 in January 2019.  INR is 1.9 with PTT 21.4.  Urinalysis was strongly positive for UTI.  The patient had grossly melanotic stool upon rectal exam by ER physician with positive stool Hemoccult. Blood and urine cultures were drawn.  Portable chest x-ray showed mild cardiomegaly with no acute cardiopulmonary disease.  EKG showed atrial fibrillation with rapid ventricular response of 126 with mild ST segment depression in inferior leads.  The patient was given 2 g of IV cefepime as well as IV Protonix bolus and drip in addition to hydration with normal saline for 3 L bolus.  Blood pressure improved to 112/98.  He will be admitted to a stepdown unit for further evaluation and management.  2/14: Patient seen and examined in the  emergency department.  Appears weak and deconditioned however is oriented x3.  Not entirely oriented to situation.  Baseline mental status unclear however appears to be improving.  Blood cultures now 3 out of 4 positive for gram-negative rods.  Vital signs.  Stabilized.  Will change admission order and admit to MedSurg bed.  2/15: Patient seen and examined.  Energy improved this morning.  Alert oriented x3.  Appears to be baseline mental status.  No fevers noted over interval.  Remains on meropenem.  Renal ultrasound negative for hydro.  2/16: Patient seen and examined.  Baseline level of mentation.  Alert and oriented x3.  No fevers over interval.  Blood cultures pansensitive E. coli.  EGD planned for tomorrow.   Assessment & Plan:   Active Problems:   Sepsis due to gram-negative UTI (HCC)   Sepsis secondary to UTI (HCC)   Pressure injury of skin   Sepsis likely secondary to urinary tract infection.   E. coli bacteremia secondary to urinary tract infection Patient was demonstrating signs of possible early shock on admission Blood pressure stabilized after administration of aggressive IV fluids Blood culture positive for E. coli, pansensitive Catheter replaced on admission Plan: DC IV meropenem IV Ancef, pharmacy to dose, 1 g every 12 hours Plan for total 14-day antibiotic course, can be transitioned to oral agent on discharge   GI bleeding with subsequent acute blood loss anemia.  Hemoglobin 7 on admission Melenic stool with positive stool occult Transfused 2 units in ED,  hemoglobin responded appropriately GI consulted on admission Plan: Continue Protonix infusion Trend hemoglobin, transfuse as needed hemoglobin less than 7 Plan for EGD on 05/14/2019 Continue to hold Eliquis  Acute kidney injury superimposed on stage IIIb chronic kidney disease Hypernatremia Baseline creatinine 1.6-1.7 AKI likely secondary to sepsis Elevated BUN secondary to GI bleed Ultrasound negative  for hydro Hypernatremia hyperchloremia likely due to volume depletion Initiate hypotonic fluids, half-normal saline Recheck BMP at 1400 today  Atrial fibrillation with rapid ventricular response, improved Rate improved after IV fluid resuscitation Patient received 1 dose of digoxin Plan: Continue home Cardizem Continue home Coreg Telemetry monitoring Eliquis on hold as above   Metabolic encephalopathy, improved Likely secondary to sepsis Improved after fluids and antibiotics  Dyslipidemia   Continue home statin   DVT prophylaxis: SCDs Code Status: DNR Family Communication: Wife at bedside, 05/11/2019 Disposition Plan: Anticipate return to home.  Patient has EGD scheduled for 05/14/2019.  Further recommendations to follow post EGD  Consultants:   GI  Procedures:  None  Antimicrobials:   Meropenem   Subjective: Seen and examined Oriented X3 Answers all questions appropriately No complaints  Objective: Vitals:   05/12/19 2009 05/13/19 0350 05/13/19 0427 05/13/19 1042  BP: 132/88  124/80 (!) 145/83  Pulse: 78  64 64  Resp: (!) 22  20   Temp: (!) 97.5 F (36.4 C)  97.8 F (36.6 C)   TempSrc: Oral  Oral   SpO2: 100% 100% 100% 100%  Weight:      Height:        Intake/Output Summary (Last 24 hours) at 05/13/2019 1137 Last data filed at 05/13/2019 0900 Gross per 24 hour  Intake 2908.91 ml  Output 1250 ml  Net 1658.91 ml   Filed Weights   05/10/19 2130 05/11/19 0709  Weight: 59 kg 58.9 kg    Examination:  General exam: Appears calm and comfortable, appears stated age Respiratory system: Clear to auscultation. Respiratory effort normal. Cardiovascular system: S1-S2, regular rate and rhythm, no murmurs  gastrointestinal system: Abdomen is nondistended, soft and nontender. No organomegaly or masses felt. Normal bowel sounds heard. Central nervous system: Alert and oriented. No focal neurological deficits. Extremities: Symmetric 5 x 5 power. Skin: No  rashes, lesions or ulcers Psychiatry: Judgement and insight appear normal. Mood & affect appropriate.     Data Reviewed: I have personally reviewed following labs and imaging studies  CBC: Recent Labs  Lab 05/10/19 2136 05/10/19 2136 05/11/19 0615 05/11/19 1348 05/11/19 1902 05/12/19 0428 05/13/19 0605  WBC 12.3*  --  21.5*  --   --  18.5* 19.5*  NEUTROABS 11.6*  --   --   --   --  15.8* 17.1*  HGB 7.0*   < > 10.2* 10.7* 10.4* 10.2* 9.8*  HCT 23.4*  --  31.9*  --   --  31.4* 29.5*  MCV 107.3*  --  97.9  --   --  96.9 95.5  PLT 94*  --  72*  --   --  80* 84*   < > = values in this interval not displayed.   Basic Metabolic Panel: Recent Labs  Lab 05/10/19 2136 05/11/19 0615 05/12/19 0428 05/13/19 0605  NA 142 143 146* 152*  K 3.8 5.8* 4.5 4.1  CL 111 116* 122* 128*  CO2 18* 17* 17* 17*  GLUCOSE 110* 109* 77 130*  BUN 122* 109* 113* 109*  CREATININE 4.44* 3.99* 3.51* 3.36*  CALCIUM 8.5* 7.7* 7.6* 7.6*   GFR: Estimated Creatinine  Clearance: 12.2 mL/min (A) (by C-G formula based on SCr of 3.36 mg/dL (H)). Liver Function Tests: Recent Labs  Lab 05/10/19 2136 05/11/19 0615  AST 24 35  ALT 22 28  ALKPHOS 133* 81  BILITOT 0.8 1.0  PROT 5.6* 5.6*  ALBUMIN 2.6* 2.5*   No results for input(s): LIPASE, AMYLASE in the last 168 hours. No results for input(s): AMMONIA in the last 168 hours. Coagulation Profile: Recent Labs  Lab 05/10/19 2136 05/11/19 0615  INR 1.9* 2.2*   Cardiac Enzymes: No results for input(s): CKTOTAL, CKMB, CKMBINDEX, TROPONINI in the last 168 hours. BNP (last 3 results) No results for input(s): PROBNP in the last 8760 hours. HbA1C: No results for input(s): HGBA1C in the last 72 hours. CBG: No results for input(s): GLUCAP in the last 168 hours. Lipid Profile: No results for input(s): CHOL, HDL, LDLCALC, TRIG, CHOLHDL, LDLDIRECT in the last 72 hours. Thyroid Function Tests: No results for input(s): TSH, T4TOTAL, FREET4, T3FREE, THYROIDAB  in the last 72 hours. Anemia Panel: No results for input(s): VITAMINB12, FOLATE, FERRITIN, TIBC, IRON, RETICCTPCT in the last 72 hours. Sepsis Labs: Recent Labs  Lab 05/10/19 2136 05/10/19 2321 05/11/19 0615 05/11/19 1348 05/11/19 1736  PROCALCITON  --   --  >150.00  --   --   LATICACIDVEN 2.0* 2.9*  --  1.6 1.0    Recent Results (from the past 240 hour(s))  Culture, blood (Routine x 2)     Status: Abnormal   Collection Time: 05/10/19  9:35 PM   Specimen: BLOOD  Result Value Ref Range Status   Specimen Description   Final    BLOOD RIGHT FOREARM Performed at [facility], [address] [place], [place] [address]    Special Requests   Final    BOTTLES DRAWN AEROBIC AND ANAEROBIC Blood Culture adequate volume Performed at [facility], [address], [place], [place] [address]    Culture  Setup Time   Final    GRAM NEGATIVE RODS IN BOTH AEROBIC AND ANAEROBIC BOTTLES CRITICAL RESULT CALLED TO, READ BACK BY AND VERIFIED WITH:  JODY BAREFOOT ON 05/11/2019 AT 0939 TIK    Culture (A)  Final    ESCHERICHIA COLI SUSCEPTIBILITIES PERFORMED ON PREVIOUS CULTURE WITHIN THE LAST 5 DAYS. Performed at [facility], [place] [address], [place], [place] [address]    Report Status 05/13/2019 FINAL  Final  Blood Culture ID Panel (Reflexed)     Status: Abnormal   Collection Time: 05/10/19  9:35 PM  Result Value Ref Range Status   Enterococcus species NOT DETECTED NOT DETECTED Final   Listeria monocytogenes NOT DETECTED NOT DETECTED Final   Staphylococcus species NOT DETECTED NOT DETECTED Final   Staphylococcus aureus (BCID) NOT DETECTED NOT DETECTED Final   Streptococcus species NOT DETECTED NOT DETECTED Final   Streptococcus agalactiae NOT DETECTED NOT DETECTED Final   Streptococcus pneumoniae NOT DETECTED NOT DETECTED Final   Streptococcus pyogenes NOT DETECTED NOT DETECTED Final   Acinetobacter baumannii NOT DETECTED NOT DETECTED Final   Enterobacteriaceae  species DETECTED (A) NOT DETECTED Final    Comment: Enterobacteriaceae represent a large family of gram-negative bacteria, not a single organism. CRITICAL RESULT CALLED TO, READ BACK BY AND VERIFIED WITH:  JODY BAREFOOT ON 05/11/2019 AT 0939 TIK    Enterobacter cloacae complex NOT DETECTED NOT DETECTED Final   Escherichia coli DETECTED (A) NOT DETECTED Final    Comment: CRITICAL RESULT CALLED TO, READ BACK BY AND VERIFIED WITH: JODY BAREFOOT  ON 05/11/2019 AT 0939 TIK    Klebsiella oxytoca NOT DETECTED NOT DETECTED Final   Klebsiella pneumoniae NOT DETECTED NOT DETECTED Final   Proteus species NOT DETECTED NOT DETECTED Final   Serratia marcescens NOT DETECTED NOT DETECTED Final   Carbapenem resistance NOT DETECTED NOT DETECTED Final   Haemophilus influenzae NOT DETECTED NOT DETECTED Final   Neisseria meningitidis NOT DETECTED NOT DETECTED Final   Pseudomonas aeruginosa NOT DETECTED NOT DETECTED Final   Candida albicans NOT DETECTED NOT DETECTED Final   Candida glabrata NOT DETECTED NOT DETECTED Final   Candida krusei NOT DETECTED NOT DETECTED Final   Candida parapsilosis NOT DETECTED NOT DETECTED Final   Candida tropicalis NOT DETECTED NOT DETECTED Final    Comment: Performed at [facility], [address] [place], [place] [address]  Culture, blood (Routine x 2)     Status: Abnormal   Collection Time: 05/10/19  9:36 PM   Specimen: BLOOD  Result Value Ref Range Status   Specimen Description   Final    BLOOD RIGHT ANTECUBITAL Performed at [facility], [address], [place], [place] [address]    Special Requests   Final    BOTTLES DRAWN AEROBIC AND ANAEROBIC Blood Culture adequate volume Performed at [facility], [address] [place], [place] [address]    Culture  Setup Time   Final    GRAM NEGATIVE RODS IN BOTH AEROBIC AND ANAEROBIC BOTTLES CRITICAL RESULT CALLED TO, READ BACK BY AND VERIFIED WITH: JODY BAREFOOT ON 05/11/2019 AT 6333  TIK Performed at [facility], [address] [place], [place] [address]    Culture ESCHERICHIA COLI (A)  Final   Report Status 05/13/2019 FINAL  Final   Organism ID, Bacteria ESCHERICHIA COLI  Final      Susceptibility   Escherichia coli - MIC*    AMPICILLIN <=2 SENSITIVE Sensitive     CEFAZOLIN <=4 SENSITIVE Sensitive     CEFEPIME <=0.12 SENSITIVE Sensitive     CEFTAZIDIME <=1 SENSITIVE Sensitive     CEFTRIAXONE <=0.25 SENSITIVE Sensitive     CIPROFLOXACIN <=0.25 SENSITIVE Sensitive     GENTAMICIN <=1 SENSITIVE Sensitive     IMIPENEM <=0.25 SENSITIVE Sensitive     TRIMETH/SULFA <=20 SENSITIVE Sensitive     AMPICILLIN/SULBACTAM <=2 SENSITIVE Sensitive     PIP/TAZO <=4 SENSITIVE Sensitive     * ESCHERICHIA COLI  Respiratory Panel by RT PCR (Flu A&B, Covid) - Nasopharyngeal Swab     Status: None   Collection Time: 05/10/19  9:36 PM   Specimen: Nasopharyngeal Swab  Result Value Ref Range Status   SARS Coronavirus 2 by RT PCR NEGATIVE NEGATIVE Final    Comment: (NOTE) SARS-CoV-2 target nucleic acids are NOT DETECTED. The SARS-CoV-2 RNA is generally detectable in upper respiratoy specimens during the acute phase of infection. The lowest concentration of SARS-CoV-2 viral copies this assay can detect is 131 copies/mL. A negative result does not preclude SARS-Cov-2 infection and should not be used as the sole basis for treatment or other patient management decisions. A negative result may occur with  improper specimen collection/handling, submission of specimen other than nasopharyngeal swab, presence of viral mutation(s) within the areas targeted by this assay, and inadequate number of viral copies (<131 copies/mL). A negative result must be combined with clinical observations, patient history, and epidemiological information. The expected result is Negative. Fact Sheet for Patients:  https://www.moore.com/ Fact Sheet for Healthcare Providers:   https://www.young.biz/ This test is not yet ap proved  or cleared by the [place] and  has been authorized for detection and/or diagnosis of SARS-CoV-2 by FDA under an Emergency Use Authorization (EUA). This EUA will remain  in effect (meaning this test can be used) for the duration of the COVID-19 declaration under Section 564(b)(1) of the Act, 21 U.S.C. section 360bbb-3(b)(1), unless the authorization is terminated or revoked sooner.    Influenza A by PCR NEGATIVE NEGATIVE Final   Influenza B by PCR NEGATIVE NEGATIVE Final    Comment: (NOTE) The Xpert Xpress SARS-CoV-2/FLU/RSV assay is intended as an aid in  the diagnosis of influenza from Nasopharyngeal swab specimens and  should not be used as a sole basis for treatment. Nasal washings and  aspirates are unacceptable for Xpert Xpress SARS-CoV-2/FLU/RSV  testing. Fact Sheet for Patients: https://www.moore.com/ Fact Sheet for Healthcare Providers: https://www.young.biz/ This test is not yet approved or cleared by the [place] FDA and  has been authorized for detection and/or diagnosis of SARS-CoV-2 by  FDA under an Emergency Use Authorization (EUA). This EUA will remain  in effect (meaning this test can be used) for the duration of the  Covid-19 declaration under Section 564(b)(1) of the Act, 21  U.S.C. section 360bbb-3(b)(1), unless the authorization is  terminated or revoked. Performed at [facility], [address] [place], [place] [address]   Urine Culture     Status: Abnormal   Collection Time: 05/10/19  9:36 PM   Specimen: Urine, Random  Result Value Ref Range Status   Specimen Description   Final    URINE, RANDOM Performed at [facility], [address] [place], [place] [address]    Special Requests   Final    NONE Performed at [facility], [address] [place], [place] [address]    Culture >=100,000  COLONIES/mL ESCHERICHIA COLI (A)  Final   Report Status 05/13/2019 FINAL  Final   Organism ID, Bacteria ESCHERICHIA COLI (A)  Final      Susceptibility   Escherichia coli - MIC*    AMPICILLIN <=2 SENSITIVE Sensitive     CEFAZOLIN <=4 SENSITIVE Sensitive     CEFTRIAXONE <=0.25 SENSITIVE Sensitive     CIPROFLOXACIN <=0.25 SENSITIVE Sensitive     GENTAMICIN <=1 SENSITIVE Sensitive     IMIPENEM <=0.25 SENSITIVE Sensitive     NITROFURANTOIN <=16 SENSITIVE Sensitive     TRIMETH/SULFA <=20 SENSITIVE Sensitive     AMPICILLIN/SULBACTAM <=2 SENSITIVE Sensitive     PIP/TAZO <=4 SENSITIVE Sensitive     * >=100,000 COLONIES/mL ESCHERICHIA COLI         Radiology Studies: DG Chest 1 View  Result Date: 05/13/2019 CLINICAL DATA:  Hypoxia. EXAM: CHEST  1 VIEW COMPARISON:  05/10/2019 FINDINGS: The heart size and mediastinal contours are within normal limits. Both lungs are clear. The visualized skeletal structures are unremarkable. There are vascular calcifications. IMPRESSION: No active disease. Electronically Signed   By: Katherine Mantle M.D.   On: 05/13/2019 02:08   US RENAL  Result Date: 05/11/2019 CLINICAL DATA:  Acute renal injury EXAM: RENAL / URINARY TRACT ULTRASOUND COMPLETE COMPARISON:  03/01/2015 FINDINGS: Right Kidney: Renal measurements: 13.1 x 6.0 x 5.0 cm. = volume: 207 mL. Diffuse increased echogenicity is noted. Multiple cysts are noted throughout the right kidney. The largest of these measures 6.1 cm. Greater than 10 cysts are noted. Left Kidney: Renal measurements: 12.0 x 6.3 x 5.3 cm. = volume: 212 mL. Diffuse increased echogenicity is noted. Multiple cysts are noted. The largest of  these measures 6.8 cm. Greater than 10 cysts are noted. Bladder: Decompressed by Foley catheter. Other: None. IMPRESSION: Bilateral renal cysts. Increased echogenicity consistent with medical renal disease. Electronically Signed   By: Alcide CleverMark  Lukens M.D.   On: 05/11/2019 15:20        Scheduled  Meds: . acidophilus  1 capsule Oral BID  . atorvastatin  80 mg Oral QPM  . carvedilol  6.25 mg Oral BID  . Chlorhexidine Gluconate Cloth  6 each Topical Daily  . diltiazem  120 mg Oral QPM  . feeding supplement (NEPRO CARB STEADY)  237 mL Oral TID BM  . multivitamin with minerals  1 tablet Oral Daily   Continuous Infusions: . sodium chloride    . sodium chloride    . sodium chloride    . sodium chloride 75 mL/hr at 05/13/19 1058  .  ceFAZolin (ANCEF) IV    . pantoprozole (PROTONIX) infusion 8 mg/hr (05/13/19 1100)     LOS: 3 days    Time spent: 35 minutes    Tresa MooreSudheer B Mekala Winger, MD [facility] Pager [phone]  If 7PM-7AM, please contact night-coverage  05/13/2019, 11:37 AM

## 2019-05-14 ENCOUNTER — Encounter: Payer: Self-pay | Admitting: Internal Medicine

## 2019-05-14 ENCOUNTER — Encounter: Payer: Self-pay | Admitting: Anesthesiology

## 2019-05-14 DIAGNOSIS — E870 Hyperosmolality and hypernatremia: Secondary | ICD-10-CM

## 2019-05-14 DIAGNOSIS — N179 Acute kidney failure, unspecified: Secondary | ICD-10-CM | POA: Diagnosis present

## 2019-05-14 DIAGNOSIS — K922 Gastrointestinal hemorrhage, unspecified: Secondary | ICD-10-CM

## 2019-05-14 DIAGNOSIS — D649 Anemia, unspecified: Secondary | ICD-10-CM

## 2019-05-14 DIAGNOSIS — N1832 Chronic kidney disease, stage 3b: Secondary | ICD-10-CM | POA: Diagnosis present

## 2019-05-14 DIAGNOSIS — I509 Heart failure, unspecified: Secondary | ICD-10-CM

## 2019-05-14 DIAGNOSIS — I4891 Unspecified atrial fibrillation: Secondary | ICD-10-CM | POA: Diagnosis present

## 2019-05-14 LAB — BASIC METABOLIC PANEL
Anion gap: 8 (ref 5–15)
BUN: 105 mg/dL — ABNORMAL HIGH (ref 8–23)
CO2: 19 mmol/L — ABNORMAL LOW (ref 22–32)
Calcium: 8.4 mg/dL — ABNORMAL LOW (ref 8.9–10.3)
Chloride: 128 mmol/L — ABNORMAL HIGH (ref 98–111)
Creatinine, Ser: 3.24 mg/dL — ABNORMAL HIGH (ref 0.61–1.24)
GFR calc Af Amer: 18 mL/min — ABNORMAL LOW (ref >60)
GFR calc non Af Amer: 16 mL/min — ABNORMAL LOW (ref >60)
Glucose, Bld: 155 mg/dL — ABNORMAL HIGH (ref 70–99)
Potassium: 3.9 mmol/L (ref 3.5–5.1)
Sodium: 155 mmol/L — ABNORMAL HIGH (ref 135–145)

## 2019-05-14 LAB — CBC WITH DIFFERENTIAL/PLATELET
Abs Immature Granulocytes: 0.21 10*3/uL — ABNORMAL HIGH (ref 0.00–0.07)
Basophils Absolute: 0 10*3/uL (ref 0.0–0.1)
Basophils Relative: 0 %
Eosinophils Absolute: 0 10*3/uL (ref 0.0–0.5)
Eosinophils Relative: 0 %
HCT: 32.2 % — ABNORMAL LOW (ref 39.0–52.0)
Hemoglobin: 10.3 g/dL — ABNORMAL LOW (ref 13.0–17.0)
Immature Granulocytes: 2 %
Lymphocytes Relative: 3 %
Lymphs Abs: 0.3 10*3/uL — ABNORMAL LOW (ref 0.7–4.0)
MCH: 31.1 pg (ref 26.0–34.0)
MCHC: 32 g/dL (ref 30.0–36.0)
MCV: 97.3 fL (ref 80.0–100.0)
Monocytes Absolute: 0.3 10*3/uL (ref 0.1–1.0)
Monocytes Relative: 3 %
Neutro Abs: 11 10*3/uL — ABNORMAL HIGH (ref 1.7–7.7)
Neutrophils Relative %: 92 %
Platelets: 83 10*3/uL — ABNORMAL LOW (ref 150–400)
RBC: 3.31 MIL/uL — ABNORMAL LOW (ref 4.22–5.81)
RDW: 18 % — ABNORMAL HIGH (ref 11.5–15.5)
WBC: 11.9 10*3/uL — ABNORMAL HIGH (ref 4.0–10.5)
nRBC: 0.2 % (ref 0.0–0.2)

## 2019-05-14 MED ORDER — DEXTROSE 5 % IV SOLN: INTRAVENOUS | Status: DC

## 2019-05-14 MED ORDER — CARVEDILOL 12.5 MG PO TABS
12.5000 mg | ORAL_TABLET | Freq: Two times a day (BID) | ORAL | Status: DC
  Administered 2019-05-14 – 2019-05-19 (×10): 12.5 mg via ORAL
  Filled 2019-05-14 (×11): qty 1

## 2019-05-14 MED ORDER — DILTIAZEM HCL ER COATED BEADS 180 MG PO CP24
180.0000 mg | ORAL_CAPSULE | Freq: Every evening | ORAL | Status: DC
  Administered 2019-05-14 – 2019-05-18 (×5): 180 mg via ORAL
  Filled 2019-05-14 (×6): qty 1

## 2019-05-14 NOTE — Progress Notes (Signed)
  Speech Language Pathology Treatment: Dysphagia  Patient Details Name: Jordan Kline MRN: [id number] DOB: 11-03-1927 Today's Date: 05/14/2019 Time: 0973-5329 SLP Time Calculation (min) (ACUTE ONLY): 40 min  Assessment / Plan / Recommendation Clinical Impression  Pt seen for ongoing assessment of toleration of diet; safety w/ oral intake. GI meeting w/ pt earlier and gave ok for oral diet/intake secondary to pt/family declining EGD procedure. Pt required "OJ". Pt is missing many Dentition(upper) but wears a lower Partial plate. Pt required min verbal cues intermittently for follow through w/ tasks. He helped to feed self holding but to drink, then fed self applesauce. Pt appeared more alert and engaged w/ oral tasks and self-feeding.   Pt consumed trials of thin liquids via Cup then Straw w/ no immediate, overt clinical s/s of aspiration; noted a mild throat clear post multiple sip trials of OJ. Educated and instructed pt on general aspiration precautions to include smaller sips, slowly and practiced this w/ pt. Pt consumed several bites of Purees and ~7-8 ozs of thin liquids. No decline in vocal quality or respiratory status noted immediately during/post trials. Attempted trials of increased textured foods w/ pt, however, pt endorsed difficulty masticating the softened solids and often took his Denture Partial out when the food became "stuck" in it. Pt appeared to best manage the Puree trials of foods. Pt has a baseline of old CVA w/ left sided weakness including oral-labial weakness. He also has poor dentition status at baseline.  Recommend continue current diet of purees (d/t Dentition status) w/ thin liquids; general aspiration precautions; feeding support at meals as needed; Pills in Puree - Crushed for safer swallowing d/t Cognitive status, illness. ST services will continue to f/u w/ pt's status while admitted; education. NSG updated.     HPI HPI: Pt is a 86 y.o. African-American male with a  known history of hypertension, dyslipidemia, CVA w/ Left sided weakness, and chronic indwelling Foley catheter, as well as atrial fibrillation, who presented to the emergency room with acute onset of altered mental status.  Per his wife he had his indwelling Foley catheter replaced a few days ago and was noted to be altered more than usual since yesterday.  He had similar presentation with previous UTIs.  Admitting dx: Sepsis and metabolic encephalopathy likely secondary to urinary tract infection.  Pt is dependent for ADLs at home and followed by Palliative Care services at home.       SLP Plan  Continue with current plan of care       Recommendations  Diet recommendations: Dysphagia 1 (puree);Thin liquid Liquids provided via: Cup;Straw(monitor) Medication Administration: Crushed with puree(for easier, safer swallowing) Supervision: Patient able to self feed;Intermittent supervision to cue for compensatory strategies Compensations: Minimize environmental distractions;Slow rate;Small sips/bites;Lingual sweep for clearance of pocketing;Multiple dry swallows after each bite/sip;Follow solids with liquid Postural Changes and/or Swallow Maneuvers: Seated upright 90 degrees;Upright 30-60 min after meal                General recommendations: (Dietician f/u for support) Oral Care Recommendations: Oral care BID;Oral care before and after PO;Staff/trained caregiver to provide oral care(cleaning of partial) Follow up Recommendations: Skilled Nursing facility(TBD) SLP Visit Diagnosis: Dysphagia, oropharyngeal phase (R13.12) Plan: Continue with current plan of care       GO                 Jerilynn Som, MS, CCC-SLP Jordan Kline 05/14/2019, 5:01 PM

## 2019-05-14 NOTE — Progress Notes (Addendum)
Progress Note    Jordan Kline  ZLD:[id number] DOB: 28-Jan-1928  DOA: 05/10/2019 PCP: Sherron Monday, MD      Brief Narrative:    Medical records reviewed and are as summarized below:       Assessment/Plan:   Active Problems:   GIB (gastrointestinal bleeding)   Sepsis due to gram-negative UTI (HCC)   Sepsis secondary to UTI (HCC)   Pressure injury of skin   AKI (acute kidney injury) (HCC)   Atrial fibrillation with RVR (HCC)   Chronic CHF (HCC)   Hypernatremia   E. coli sepsis and bacteremia secondary to UTI: Continue IV Ancef.  GI bleeding with acute blood loss anemia: Patient and family have decided against any endoscopic work-up.  GI has signed off.  Monitor H&H and transfuse as needed.   Hypernatremia: Start low rate IV fluids.  PVCs/nonsustained V. tach on telemetry: Obtain 2D echo for further evaluation.  History of CHF: Specifics unknown.  I could not find any 2D echo on the chart.  Patient was on Entresto prior to admission but this was discontinued because of AKI.  Obtain 2D echo for further evaluation.  Paroxysmal atrial fibrillation: Eliquis on hold because of GI bleed.  Continue Cardizem and carvedilol.  Uncontrolled hypertension: Increase dose of carvedilol and Cardizem.  AKI on CKD stage IIIb: Resume gentle hydration with IV fluids.  Toxic metabolic encephalopathy: Improved  Chronic urinary retention: Patient has Foley catheter in place.  Patient has multiple comorbidities and given severity of his illness, I recommend palliative care evaluation.  This was discussed with his wife who is in agreement with the plan.    Pressure Injury 05/11/19 Buttocks Medial;Mid Stage 1 -  Intact skin with non-blanchable redness of a localized area usually over a bony prominence. non-blenchable (Active)  05/11/19 1100  Location: Buttocks  Location Orientation: Medial;Mid  Staging: Stage 1 -  Intact skin with non-blanchable redness of a localized  area usually over a bony prominence.  Wound Description (Comments): non-blenchable  Present on Admission: Yes         Body mass index is 19.74 kg/m.   Family Communication/Anticipated D/C date and plan/Code Status   DVT prophylaxis: SCDs Code Status: DNR  family Communication: Plan discussed with his wife. Disposition Plan: Possible discharge in 2 to 3 days      Subjective:   No complaints.  No chest pain or shortness of breath.  Overnight events noted.  Patient had 13 beat run of nonsustained V. tach on telemetry this morning.  He also had episodes of PVCs on telemetry.  Objective:    Vitals:   05/13/19 1701 05/13/19 2056 05/14/19 0314 05/14/19 1230  BP: (!) 151/85 137/85 (!) 145/90 (!) 152/103  Pulse: 99 76 70 87  Resp:   (!) 24 16  Temp:  98.1 F (36.7 C) 98.2 F (36.8 C) 97.9 F (36.6 C)  TempSrc:  Oral Oral Oral  SpO2: 100% 100% 100% 100%  Weight:      Height:        Intake/Output Summary (Last 24 hours) at 05/14/2019 1408 Last data filed at 05/14/2019 0600 Gross per 24 hour  Intake 340.53 ml  Output 2450 ml  Net -2109.47 ml   Filed Weights   05/10/19 2130 05/11/19 0709  Weight: 59 kg 58.9 kg    Exam:  GEN: NAD SKIN: No rash EYES: EOMI ENT: MMM CV: RRR PULM: Air entry adequate bilaterally, occasional wheezing; no rales heard ABD: soft, ND, NT, +  BS CNS: AAO x 3, 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 EXT: No edema or tenderness GU: Bilateral inguinal hernia  Data Reviewed:   I have personally reviewed following labs and imaging studies:  Labs: Labs show the following:   Basic Metabolic Panel: Recent Labs  Lab 05/11/19 0615 05/11/19 0615 05/12/19 0428 05/12/19 0428 05/13/19 0605 05/13/19 0605 05/13/19 1418 05/14/19 0633  NA 143  --  146*  --  152*  --  151* 155*  K 5.8*   < > 4.5   < > 4.1   < > 4.2 3.9  CL 116*  --  122*  --  128*  --  128* 128*  CO2 17*  --  17*  --  17*  --  19* 19*  GLUCOSE  109*  --  77  --  130*  --  177* 155*  BUN 109*  --  113*  --  109*  --  101* 105*  CREATININE 3.99*  --  3.51*  --  3.36*  --  3.33* 3.24*  CALCIUM 7.7*  --  7.6*  --  7.6*  --  7.9* 8.4*   < > = values in this interval not displayed.   GFR Estimated Creatinine Clearance: 12.6 mL/min (A) (by C-G formula based on SCr of 3.24 mg/dL (H)). Liver Function Tests: Recent Labs  Lab 05/10/19 2136 05/11/19 0615  AST 24 35  ALT 22 28  ALKPHOS 133* 81  BILITOT 0.8 1.0  PROT 5.6* 5.6*  ALBUMIN 2.6* 2.5*   No results for input(s): LIPASE, AMYLASE in the last 168 hours. No results for input(s): AMMONIA in the last 168 hours. Coagulation profile Recent Labs  Lab 05/10/19 2136 05/11/19 0615  INR 1.9* 2.2*    CBC: Recent Labs  Lab 05/10/19 2136 05/10/19 2136 05/11/19 0615 05/11/19 0615 05/11/19 1348 05/11/19 1902 05/12/19 0428 05/13/19 0605 05/14/19 0633  WBC 12.3*  --  21.5*  --   --   --  18.5* 19.5* 11.9*  NEUTROABS 11.6*  --   --   --   --   --  15.8* 17.1* 11.0*  HGB 7.0*   < > 10.2*   < > 10.7* 10.4* 10.2* 9.8* 10.3*  HCT 23.4*  --  31.9*  --   --   --  31.4* 29.5* 32.2*  MCV 107.3*  --  97.9  --   --   --  96.9 95.5 97.3  PLT 94*  --  72*  --   --   --  80* 84* 83*   < > = values in this interval not displayed.   Cardiac Enzymes: No results for input(s): CKTOTAL, CKMB, CKMBINDEX, TROPONINI in the last 168 hours. BNP (last 3 results) No results for input(s): PROBNP in the last 8760 hours. CBG: No results for input(s): GLUCAP in the last 168 hours. D-Dimer: No results for input(s): DDIMER in the last 72 hours. Hgb A1c: No results for input(s): HGBA1C in the last 72 hours. Lipid Profile: No results for input(s): CHOL, HDL, LDLCALC, TRIG, CHOLHDL, LDLDIRECT in the last 72 hours. Thyroid function studies: No results for input(s): TSH, T4TOTAL, T3FREE, THYROIDAB in the last 72 hours.  Invalid input(s): FREET3 Anemia work up: No results for input(s): VITAMINB12,  FOLATE, FERRITIN, TIBC, IRON, RETICCTPCT in the last 72 hours. Sepsis Labs: Recent Labs  Lab 05/10/19 2136 05/10/19 2136 05/10/19 2321 05/11/19 1610 05/11/19 1348 05/11/19 1736 05/12/19 9604 05/13/19 5409 05/14/19 8119  PROCALCITON  --   --   --  >150.00  --   --   --   --   --   WBC 12.3*   < >  --  21.5*  --   --  18.5* 19.5* 11.9*  LATICACIDVEN 2.0*  --  2.9*  --  1.6 1.0  --   --   --    < > = values in this interval not displayed.    Microbiology Recent Results (from the past 240 hour(s))  Culture, blood (Routine x 2)     Status: Abnormal   Collection Time: 05/10/19  9:35 PM   Specimen: BLOOD  Result Value Ref Range Status   Specimen Description   Final    BLOOD RIGHT FOREARM Performed at [facility], [address], [place], [place] [address]    Special Requests   Final    BOTTLES DRAWN AEROBIC AND ANAEROBIC Blood Culture adequate volume Performed at [facility], [address] [place], [place] [address]    Culture  Setup Time   Final    GRAM NEGATIVE RODS IN BOTH AEROBIC AND ANAEROBIC BOTTLES CRITICAL RESULT CALLED TO, READ BACK BY AND VERIFIED WITH:  JODY BAREFOOT ON 05/11/2019 AT 0939 TIK    Culture (A)  Final    ESCHERICHIA COLI SUSCEPTIBILITIES PERFORMED ON PREVIOUS CULTURE WITHIN THE LAST 5 DAYS. Performed at [facility], [place] [address] [place], [place] [address]    Report Status 05/13/2019 FINAL  Final  Blood Culture ID Panel (Reflexed)     Status: Abnormal   Collection Time: 05/10/19  9:35 PM  Result Value Ref Range Status   Enterococcus species NOT DETECTED NOT DETECTED Final   Listeria monocytogenes NOT DETECTED NOT DETECTED Final   Staphylococcus species NOT DETECTED NOT DETECTED Final   Staphylococcus aureus (BCID) NOT DETECTED NOT DETECTED Final   Streptococcus species NOT DETECTED NOT DETECTED Final   Streptococcus agalactiae NOT DETECTED NOT DETECTED Final   Streptococcus pneumoniae NOT DETECTED NOT DETECTED  Final   Streptococcus pyogenes NOT DETECTED NOT DETECTED Final   Acinetobacter baumannii NOT DETECTED NOT DETECTED Final   Enterobacteriaceae species DETECTED (A) NOT DETECTED Final    Comment: Enterobacteriaceae represent a large family of gram-negative bacteria, not a single organism. CRITICAL RESULT CALLED TO, READ BACK BY AND VERIFIED WITH:  JODY BAREFOOT ON 05/11/2019 AT 0939 TIK    Enterobacter cloacae complex NOT DETECTED NOT DETECTED Final   Escherichia coli DETECTED (A) NOT DETECTED Final    Comment: CRITICAL RESULT CALLED TO, READ BACK BY AND VERIFIED WITH: JODY BAREFOOT ON 05/11/2019 AT 0939 TIK    Klebsiella oxytoca NOT DETECTED NOT DETECTED Final   Klebsiella pneumoniae NOT DETECTED NOT DETECTED Final   Proteus species NOT DETECTED NOT DETECTED Final   Serratia marcescens NOT DETECTED NOT DETECTED Final   Carbapenem resistance NOT DETECTED NOT DETECTED Final   Haemophilus influenzae NOT DETECTED NOT DETECTED Final   Neisseria meningitidis NOT DETECTED NOT DETECTED Final   Pseudomonas aeruginosa NOT DETECTED NOT DETECTED Final   Candida albicans NOT DETECTED NOT DETECTED Final   Candida glabrata NOT DETECTED NOT DETECTED Final   Candida krusei NOT DETECTED NOT DETECTED Final   Candida parapsilosis NOT DETECTED NOT DETECTED Final   Candida tropicalis NOT DETECTED NOT DETECTED Final    Comment: Performed at [facility], [address] [place], [place] [address]  Culture, blood (Routine x 2)     Status: Abnormal  Collection Time: 05/10/19  9:36 PM   Specimen: BLOOD  Result Value Ref Range Status   Specimen Description   Final    BLOOD RIGHT ANTECUBITAL Performed at [facility], [address], [place], [place] [address]    Special Requests   Final    BOTTLES DRAWN AEROBIC AND ANAEROBIC Blood Culture adequate volume Performed at [facility], [address] [place], [place] [address]    Culture  Setup Time   Final    GRAM NEGATIVE  RODS IN BOTH AEROBIC AND ANAEROBIC BOTTLES CRITICAL RESULT CALLED TO, READ BACK BY AND VERIFIED WITH: JODY BAREFOOT ON 05/11/2019 AT 8416 TIK Performed at [facility], [address] [place], [place] [address]    Culture ESCHERICHIA COLI (A)  Final   Report Status 05/13/2019 FINAL  Final   Organism ID, Bacteria ESCHERICHIA COLI  Final      Susceptibility   Escherichia coli - MIC*    AMPICILLIN <=2 SENSITIVE Sensitive     CEFAZOLIN <=4 SENSITIVE Sensitive     CEFEPIME <=0.12 SENSITIVE Sensitive     CEFTAZIDIME <=1 SENSITIVE Sensitive     CEFTRIAXONE <=0.25 SENSITIVE Sensitive     CIPROFLOXACIN <=0.25 SENSITIVE Sensitive     GENTAMICIN <=1 SENSITIVE Sensitive     IMIPENEM <=0.25 SENSITIVE Sensitive     TRIMETH/SULFA <=20 SENSITIVE Sensitive     AMPICILLIN/SULBACTAM <=2 SENSITIVE Sensitive     PIP/TAZO <=4 SENSITIVE Sensitive     * ESCHERICHIA COLI  Respiratory Panel by RT PCR (Flu A&B, Covid) - Nasopharyngeal Swab     Status: None   Collection Time: 05/10/19  9:36 PM   Specimen: Nasopharyngeal Swab  Result Value Ref Range Status   SARS Coronavirus 2 by RT PCR NEGATIVE NEGATIVE Final    Comment: (NOTE) SARS-CoV-2 target nucleic acids are NOT DETECTED. The SARS-CoV-2 RNA is generally detectable in upper respiratoy specimens during the acute phase of infection. The lowest concentration of SARS-CoV-2 viral copies this assay can detect is 131 copies/mL. A negative result does not preclude SARS-Cov-2 infection and should not be used as the sole basis for treatment or other patient management decisions. A negative result may occur with  improper specimen collection/handling, submission of specimen other than nasopharyngeal swab, presence of viral mutation(s) within the areas targeted by this assay, and inadequate number of viral copies (<131 copies/mL). A negative result must be combined with clinical observations, patient history, and epidemiological information.  The expected result is Negative. Fact Sheet for Patients:  https://www.moore.com/ Fact Sheet for Healthcare Providers:  https://www.young.biz/ This test is not yet ap proved or cleared by the [place] FDA and  has been authorized for detection and/or diagnosis of SARS-CoV-2 by FDA under an Emergency Use Authorization (EUA). This EUA will remain  in effect (meaning this test can be used) for the duration of the COVID-19 declaration under Section 564(b)(1) of the Act, 21 U.S.C. section 360bbb-3(b)(1), unless the authorization is terminated or revoked sooner.    Influenza A by PCR NEGATIVE NEGATIVE Final   Influenza B by PCR NEGATIVE NEGATIVE Final    Comment: (NOTE) The Xpert Xpress SARS-CoV-2/FLU/RSV assay is intended as an aid in  the diagnosis of influenza from Nasopharyngeal swab specimens and  should not be used as a sole basis for treatment. Nasal washings and  aspirates are unacceptable for Xpert Xpress SARS-CoV-2/FLU/RSV  testing. Fact Sheet for Patients: https://www.moore.com/ Fact Sheet for Healthcare Providers: https://www.young.biz/ This test is not yet approved or cleared by  the [employer] and  has been authorized for detection and/or diagnosis of SARS-CoV-2 by  FDA under an Emergency Use Authorization (EUA). This EUA will remain  in effect (meaning this test can be used) for the duration of the  Covid-19 declaration under Section 564(b)(1) of the Act, 21  U.S.C. section 360bbb-3(b)(1), unless the authorization is  terminated or revoked. Performed at [facility], [address] [place], [place] [address]   Urine Culture     Status: Abnormal   Collection Time: 05/10/19  9:36 PM   Specimen: Urine, Random  Result Value Ref Range Status   Specimen Description   Final    URINE, RANDOM Performed at [facility], [address] [place], [place] [address]     Special Requests   Final    NONE Performed at [facility], [address] [place], [place] [address]    Culture >=100,000 COLONIES/mL ESCHERICHIA COLI (A)  Final   Report Status 05/13/2019 FINAL  Final   Organism ID, Bacteria ESCHERICHIA COLI (A)  Final      Susceptibility   Escherichia coli - MIC*    AMPICILLIN <=2 SENSITIVE Sensitive     CEFAZOLIN <=4 SENSITIVE Sensitive     CEFTRIAXONE <=0.25 SENSITIVE Sensitive     CIPROFLOXACIN <=0.25 SENSITIVE Sensitive     GENTAMICIN <=1 SENSITIVE Sensitive     IMIPENEM <=0.25 SENSITIVE Sensitive     NITROFURANTOIN <=16 SENSITIVE Sensitive     TRIMETH/SULFA <=20 SENSITIVE Sensitive     AMPICILLIN/SULBACTAM <=2 SENSITIVE Sensitive     PIP/TAZO <=4 SENSITIVE Sensitive     * >=100,000 COLONIES/mL ESCHERICHIA COLI    Procedures and diagnostic studies:  DG Chest 1 View  Result Date: 05/13/2019 CLINICAL DATA:  Hypoxia. EXAM: CHEST  1 VIEW COMPARISON:  05/10/2019 FINDINGS: The heart size and mediastinal contours are within normal limits. Both lungs are clear. The visualized skeletal structures are unremarkable. There are vascular calcifications. IMPRESSION: No active disease. Electronically Signed   By: Katherine Mantle M.D.   On: 05/13/2019 02:08   DG Chest Port 1 View  Result Date: 05/13/2019 CLINICAL DATA:  Inpatient, wheezing, hx of stroke and hypertension EXAM: PORTABLE CHEST 1 VIEW COMPARISON:  None. FINDINGS: Stable cardiac silhouette. Mild central venous congestion. No effusion, infiltrate pneumothorax. Ectatic aorta. No acute osseous abnormality. IMPRESSION: Venous pulmonary congestion similar to prior. Electronically Signed   By: Genevive Bi M.D.   On: 05/13/2019 13:13    Medications:   . acidophilus  1 capsule Oral BID  . atorvastatin  80 mg Oral QPM  . carvedilol  12.5 mg Oral BID  . Chlorhexidine Gluconate Cloth  6 each Topical Daily  . diltiazem  180 mg Oral QPM  . feeding supplement (NEPRO CARB STEADY)  237 mL  Oral TID BM  . multivitamin with minerals  1 tablet Oral Daily   Continuous Infusions: . sodium chloride    . sodium chloride    . sodium chloride    .  ceFAZolin (ANCEF) IV Stopped (05/13/19 1919)  . dextrose       LOS: 4 days   Toshia Larkin  [facility]     05/14/2019, 2:08 PM

## 2019-05-14 NOTE — Progress Notes (Signed)
MD was notified of 8 beats V-tach episode about  17:30. No new order

## 2019-05-14 NOTE — Progress Notes (Signed)
SLP Cancellation Note  Patient Details Name: Jordan Kline MRN: [id number] DOB: [birth year]   Cancelled treatment:       Reason Eval/Treat Not Completed: (chart reviewed; pt is NPO). Pt is currently NPO for potential procedure today. ST services will continue to f/u tomorrow. Aspiration precautions.      Jerilynn Som, MS, CCC-SLP Watson,Katherine 05/14/2019, 10:29 AM

## 2019-05-14 NOTE — Progress Notes (Signed)
Jordan Kline , MD [address], [place], [place], [address] 3940 [address], [address], [place], [place], [address] Phone: [phone]  Fax: [phone]   Gabreil [place] is being followed for melena  Subjective: No complaints   Objective: Vital signs in last 24 hours: Vitals:   05/13/19 1638 05/13/19 1701 05/13/19 2056 05/14/19 0314  BP: (!) 176/151 (!) 151/85 137/85 (!) 145/90  Pulse: (!) 108 99 76 70  Resp: 20   (!) 24  Temp: (!) 97.4 F (36.3 C)  98.1 F (36.7 C) 98.2 F (36.8 C)  TempSrc: Oral  Oral Oral  SpO2: 99% 100% 100% 100%  Weight:      Height:       Weight change:   Intake/Output Summary (Last 24 hours) at 05/14/2019 1041 Last data filed at 05/14/2019 0600 Gross per 24 hour  Intake 614.1 ml  Output 2450 ml  Net -1835.9 ml     Exam:  Alert and oriented x2 not in any pain or distress Lab Results: @LABTEST2 @ Micro Results: Recent Results (from the past 240 hour(s))  Culture, blood (Routine x 2)     Status: Abnormal   Collection Time: 05/10/19  9:35 PM   Specimen: BLOOD  Result Value Ref Range Status   Specimen Description   Final    BLOOD RIGHT FOREARM Performed at [facility], [address] [place], [place]    Special Requests   Final    BOTTLES DRAWN AEROBIC AND ANAEROBIC Blood Culture adequate volume Performed at [facility], [address] [place], [place]    Culture  Setup Time   Final    GRAM NEGATIVE RODS IN BOTH AEROBIC AND ANAEROBIC BOTTLES CRITICAL RESULT CALLED TO, READ BACK BY AND VERIFIED WITH:  JODY BAREFOOT ON 05/11/2019 AT 0939 TIK    Culture (A)  Final    ESCHERICHIA COLI SUSCEPTIBILITIES PERFORMED ON PREVIOUS CULTURE WITHIN THE LAST 5 DAYS. Performed at [facility], [address] [place], [place]    Report Status 05/13/2019 FINAL  Final  Blood Culture ID Panel (Reflexed)     Status: Abnormal   Collection Time: 05/10/19  9:35 PM  Result Value Ref  Range Status   Enterococcus species NOT DETECTED NOT DETECTED Final   Listeria monocytogenes NOT DETECTED NOT DETECTED Final   Staphylococcus species NOT DETECTED NOT DETECTED Final   Staphylococcus aureus (BCID) NOT DETECTED NOT DETECTED Final   Streptococcus species NOT DETECTED NOT DETECTED Final   Streptococcus agalactiae NOT DETECTED NOT DETECTED Final   Streptococcus pneumoniae NOT DETECTED NOT DETECTED Final   Streptococcus pyogenes NOT DETECTED NOT DETECTED Final   Acinetobacter baumannii NOT DETECTED NOT DETECTED Final   Enterobacteriaceae species DETECTED (A) NOT DETECTED Final    Comment: Enterobacteriaceae represent a large family of gram-negative bacteria, not a single organism. CRITICAL RESULT CALLED TO, READ BACK BY AND VERIFIED WITH:  JODY BAREFOOT ON 05/11/2019 AT 0939 TIK    Enterobacter cloacae complex NOT DETECTED NOT DETECTED Final   Escherichia coli DETECTED (A) NOT DETECTED Final    Comment: CRITICAL RESULT CALLED TO, READ BACK BY AND VERIFIED WITH: JODY BAREFOOT ON 05/11/2019 AT 0939 TIK    Klebsiella oxytoca NOT DETECTED NOT DETECTED Final   Klebsiella pneumoniae NOT DETECTED NOT DETECTED Final   Proteus species NOT DETECTED NOT DETECTED Final   Serratia marcescens NOT DETECTED NOT DETECTED Final   Carbapenem resistance NOT DETECTED NOT DETECTED Final   Haemophilus  influenzae NOT DETECTED NOT DETECTED Final   Neisseria meningitidis NOT DETECTED NOT DETECTED Final   Pseudomonas aeruginosa NOT DETECTED NOT DETECTED Final   Candida albicans NOT DETECTED NOT DETECTED Final   Candida glabrata NOT DETECTED NOT DETECTED Final   Candida krusei NOT DETECTED NOT DETECTED Final   Candida parapsilosis NOT DETECTED NOT DETECTED Final   Candida tropicalis NOT DETECTED NOT DETECTED Final    Comment: Performed at [facility], [address] [place], [place] [address]  Culture, blood (Routine x 2)     Status: Abnormal   Collection Time: 05/10/19  9:36 PM    Specimen: BLOOD  Result Value Ref Range Status   Specimen Description   Final    BLOOD RIGHT ANTECUBITAL Performed at [facility], [address], [place], [place] [address]    Special Requests   Final    BOTTLES DRAWN AEROBIC AND ANAEROBIC Blood Culture adequate volume Performed at [facility], [address] [place], [place] [address]    Culture  Setup Time   Final    GRAM NEGATIVE RODS IN BOTH AEROBIC AND ANAEROBIC BOTTLES CRITICAL RESULT CALLED TO, READ BACK BY AND VERIFIED WITH: JODY BAREFOOT ON 05/11/2019 AT 6967 TIK Performed at [facility], [address] [place], [place] [address]    Culture ESCHERICHIA COLI (A)  Final   Report Status 05/13/2019 FINAL  Final   Organism ID, Bacteria ESCHERICHIA COLI  Final      Susceptibility   Escherichia coli - MIC*    AMPICILLIN <=2 SENSITIVE Sensitive     CEFAZOLIN <=4 SENSITIVE Sensitive     CEFEPIME <=0.12 SENSITIVE Sensitive     CEFTAZIDIME <=1 SENSITIVE Sensitive     CEFTRIAXONE <=0.25 SENSITIVE Sensitive     CIPROFLOXACIN <=0.25 SENSITIVE Sensitive     GENTAMICIN <=1 SENSITIVE Sensitive     IMIPENEM <=0.25 SENSITIVE Sensitive     TRIMETH/SULFA <=20 SENSITIVE Sensitive     AMPICILLIN/SULBACTAM <=2 SENSITIVE Sensitive     PIP/TAZO <=4 SENSITIVE Sensitive     * ESCHERICHIA COLI  Respiratory Panel by RT PCR (Flu A&B, Covid) - Nasopharyngeal Swab     Status: None   Collection Time: 05/10/19  9:36 PM   Specimen: Nasopharyngeal Swab  Result Value Ref Range Status   SARS Coronavirus 2 by RT PCR NEGATIVE NEGATIVE Final    Comment: (NOTE) SARS-CoV-2 target nucleic acids are NOT DETECTED. The SARS-CoV-2 RNA is generally detectable in upper respiratoy specimens during the acute phase of infection. The lowest concentration of SARS-CoV-2 viral copies this assay can detect is 131 copies/mL. A negative result does not preclude SARS-Cov-2 infection and should not be used as the sole basis for treatment  or other patient management decisions. A negative result may occur with  improper specimen collection/handling, submission of specimen other than nasopharyngeal swab, presence of viral mutation(s) within the areas targeted by this assay, and inadequate number of viral copies (<131 copies/mL). A negative result must be combined with clinical observations, patient history, and epidemiological information. The expected result is Negative. Fact Sheet for Patients:  https://www.moore.com/ Fact Sheet for Healthcare Providers:  https://www.young.biz/ This test is not yet ap proved or cleared by the [place] FDA and  has been authorized for detection and/or diagnosis of SARS-CoV-2 by FDA under an Emergency Use Authorization (EUA). This EUA will remain  in effect (meaning this test can be used) for the duration of the COVID-19 declaration under Section 564(b)(1) of the Act, 21 U.S.C.  section 360bbb-3(b)(1), unless the authorization is terminated or revoked sooner.    Influenza A by PCR NEGATIVE NEGATIVE Final   Influenza B by PCR NEGATIVE NEGATIVE Final    Comment: (NOTE) The Xpert Xpress SARS-CoV-2/FLU/RSV assay is intended as an aid in  the diagnosis of influenza from Nasopharyngeal swab specimens and  should not be used as a sole basis for treatment. Nasal washings and  aspirates are unacceptable for Xpert Xpress SARS-CoV-2/FLU/RSV  testing. Fact Sheet for Patients: PinkCheek.be Fact Sheet for Healthcare Providers: GravelBags.it This test is not yet approved or cleared by the [place] FDA and  has been authorized for detection and/or diagnosis of SARS-CoV-2 by  FDA under an Emergency Use Authorization (EUA). This EUA will remain  in effect (meaning this test can be used) for the duration of the  Covid-19 declaration under Section 564(b)(1) of the Act, 21  U.S.C. section  360bbb-3(b)(1), unless the authorization is  terminated or revoked. Performed at [facility], [address] [place], [place] [address]   Urine Culture     Status: Abnormal   Collection Time: 05/10/19  9:36 PM   Specimen: Urine, Random  Result Value Ref Range Status   Specimen Description   Final    URINE, RANDOM Performed at [facility], [address] [place], [place] [address]    Special Requests   Final    NONE Performed at [facility], [place], [place] [address]    Culture >=100,000 COLONIES/mL ESCHERICHIA COLI (A)  Final   Report Status 05/13/2019 FINAL  Final   Organism ID, Bacteria ESCHERICHIA COLI (A)  Final      Susceptibility   Escherichia coli - MIC*    AMPICILLIN <=2 SENSITIVE Sensitive     CEFAZOLIN <=4 SENSITIVE Sensitive     CEFTRIAXONE <=0.25 SENSITIVE Sensitive     CIPROFLOXACIN <=0.25 SENSITIVE Sensitive     GENTAMICIN <=1 SENSITIVE Sensitive     IMIPENEM <=0.25 SENSITIVE Sensitive     NITROFURANTOIN <=16 SENSITIVE Sensitive     TRIMETH/SULFA <=20 SENSITIVE Sensitive     AMPICILLIN/SULBACTAM <=2 SENSITIVE Sensitive     PIP/TAZO <=4 SENSITIVE Sensitive     * >=100,000 COLONIES/mL ESCHERICHIA COLI   Studies/Results: DG Chest 1 View  Result Date: 05/13/2019 CLINICAL DATA:  Hypoxia. EXAM: CHEST  1 VIEW COMPARISON:  05/10/2019 FINDINGS: The heart size and mediastinal contours are within normal limits. Both lungs are clear. The visualized skeletal structures are unremarkable. There are vascular calcifications. IMPRESSION: No active disease. Electronically Signed   By: Constance Holster M.D.   On: 05/13/2019 02:08   DG Chest Port 1 View  Result Date: 05/13/2019 CLINICAL DATA:  Inpatient, wheezing, hx of stroke and hypertension EXAM: PORTABLE CHEST 1 VIEW COMPARISON:  None. FINDINGS: Stable cardiac silhouette. Mild central venous congestion. No effusion, infiltrate pneumothorax. Ectatic aorta. No acute osseous  abnormality. IMPRESSION: Venous pulmonary congestion similar to prior. Electronically Signed   By: Suzy Bouchard M.D.   On: 05/13/2019 13:13   Medications: I have reviewed the patient's current medications. Scheduled Meds: . acidophilus  1 capsule Oral BID  . atorvastatin  80 mg Oral QPM  . carvedilol  6.25 mg Oral BID  . Chlorhexidine Gluconate Cloth  6 each Topical Daily  . diltiazem  120 mg Oral QPM  . feeding supplement (NEPRO CARB STEADY)  237 mL Oral TID BM  . multivitamin with minerals  1 tablet Oral Daily   Continuous Infusions: . sodium chloride    .  sodium chloride    . sodium chloride    .  ceFAZolin (ANCEF) IV Stopped (05/13/19 1919)   PRN Meds:.acetaminophen **OR** acetaminophen, ipratropium-albuterol   Assessment: Active Problems:   Sepsis due to gram-negative UTI (HCC)   Sepsis secondary to UTI (HCC)   Pressure injury of skin  Raequan Vanschaick 86 y.o. male was admitted on 05/10/2019 with altered mental status.  History of atrial fibrillation.  On admission hemoglobin was 7 g.  History of melena.  Treated for sepsis secondary to UTI.  Known history of iron deficiency anemia.  Had been on Eliquis and hence EGD was deferred to 05/14/2019.  Patient did not consent for the same yesterday as he had further questions.  Hemoglobin has been stable for the last 3 to 4 days. Spoke to the patient and family including the son Minerva Areola. They have all declined to proceed with the endoscopy. They are aware of the risks of doing so. I have informed him that if there were to change the mind and decide to proceed with the endoscopy to let me know when ready.  I will sign off.  Please call me if any further GI concerns or questions.  We would like to thank you for the opportunity to participate in the care of [facility].    LOS: 4 days   Jordan Mood, MD 05/14/2019, 10:41 AM

## 2019-05-14 NOTE — Progress Notes (Signed)
   05/14/19 1400  Clinical Encounter Type  Visited With Patient  Visit Type Follow-up  Referral From Chaplain  Consult/Referral To Chaplain  Chaplain briefly visited with patient. Patient was eating apple sauce. Chaplain noticed birthday cards on the window sill and said Happy Birthday. Patient said thank you.

## 2019-05-15 ENCOUNTER — Encounter: Payer: Self-pay | Admitting: Internal Medicine

## 2019-05-15 ENCOUNTER — Inpatient Hospital Stay (HOSPITAL_COMMUNITY)
Admit: 2019-05-15 | Discharge: 2019-05-15 | Disposition: A | Discharge status: Home / Self Care | Payer: Medicare HMO | Attending: Internal Medicine | Admitting: Internal Medicine

## 2019-05-15 ENCOUNTER — Inpatient Hospital Stay: Payer: Medicare HMO

## 2019-05-15 ENCOUNTER — Encounter
Admission: EM | Disposition: A | Discharge status: Home Health | Payer: Self-pay | Source: Home / Self Care | Attending: Internal Medicine

## 2019-05-15 DIAGNOSIS — R4182 Altered mental status, unspecified: Secondary | ICD-10-CM

## 2019-05-15 DIAGNOSIS — Z515 Encounter for palliative care: Secondary | ICD-10-CM

## 2019-05-15 DIAGNOSIS — I351 Nonrheumatic aortic (valve) insufficiency: Secondary | ICD-10-CM

## 2019-05-15 DIAGNOSIS — I361 Nonrheumatic tricuspid (valve) insufficiency: Secondary | ICD-10-CM

## 2019-05-15 DIAGNOSIS — Z7189 Other specified counseling: Secondary | ICD-10-CM

## 2019-05-15 LAB — CBC WITH DIFFERENTIAL/PLATELET
Abs Immature Granulocytes: 0.32 10*3/uL — ABNORMAL HIGH (ref 0.00–0.07)
Basophils Absolute: 0 10*3/uL (ref 0.0–0.1)
Basophils Relative: 0 %
Eosinophils Absolute: 0 10*3/uL (ref 0.0–0.5)
Eosinophils Relative: 0 %
HCT: 31.4 % — ABNORMAL LOW (ref 39.0–52.0)
Hemoglobin: 10.2 g/dL — ABNORMAL LOW (ref 13.0–17.0)
Immature Granulocytes: 2 %
Lymphocytes Relative: 3 %
Lymphs Abs: 0.5 10*3/uL — ABNORMAL LOW (ref 0.7–4.0)
MCH: 31.3 pg (ref 26.0–34.0)
MCHC: 32.5 g/dL (ref 30.0–36.0)
MCV: 96.3 fL (ref 80.0–100.0)
Monocytes Absolute: 1.4 10*3/uL — ABNORMAL HIGH (ref 0.1–1.0)
Monocytes Relative: 7 %
Neutro Abs: 17.5 10*3/uL — ABNORMAL HIGH (ref 1.7–7.7)
Neutrophils Relative %: 88 %
Platelets: 92 10*3/uL — ABNORMAL LOW (ref 150–400)
RBC: 3.26 MIL/uL — ABNORMAL LOW (ref 4.22–5.81)
RDW: 17.9 % — ABNORMAL HIGH (ref 11.5–15.5)
WBC: 19.7 10*3/uL — ABNORMAL HIGH (ref 4.0–10.5)
nRBC: 0.2 % (ref 0.0–0.2)

## 2019-05-15 LAB — ECHOCARDIOGRAM COMPLETE
Height: 68 in
Weight: 2077.62 oz

## 2019-05-15 LAB — BASIC METABOLIC PANEL
Anion gap: 5 (ref 5–15)
BUN: 111 mg/dL — ABNORMAL HIGH (ref 8–23)
CO2: 22 mmol/L (ref 22–32)
Calcium: 8.4 mg/dL — ABNORMAL LOW (ref 8.9–10.3)
Chloride: 127 mmol/L — ABNORMAL HIGH (ref 98–111)
Creatinine, Ser: 2.9 mg/dL — ABNORMAL HIGH (ref 0.61–1.24)
GFR calc Af Amer: 21 mL/min — ABNORMAL LOW (ref >60)
GFR calc non Af Amer: 18 mL/min — ABNORMAL LOW (ref >60)
Glucose, Bld: 137 mg/dL — ABNORMAL HIGH (ref 70–99)
Potassium: 4.2 mmol/L (ref 3.5–5.1)
Sodium: 154 mmol/L — ABNORMAL HIGH (ref 135–145)

## 2019-05-15 LAB — MAGNESIUM: Magnesium: 2.4 mg/dL (ref 1.7–2.4)

## 2019-05-15 SURGERY — ESOPHAGOGASTRODUODENOSCOPY (EGD) WITH PROPOFOL
Anesthesia: General

## 2019-05-15 MED ORDER — IPRATROPIUM-ALBUTEROL 0.5-2.5 (3) MG/3ML IN SOLN
3.0000 mL | Freq: Four times a day (QID) | RESPIRATORY_TRACT | Status: DC
  Administered 2019-05-15 – 2019-05-16 (×3): 3 mL via RESPIRATORY_TRACT
  Filled 2019-05-15 (×4): qty 3

## 2019-05-15 NOTE — Consult Note (Signed)
Consultation Note Date: 05/15/2019   Patient Name: Jordan Kline  DOB: Sep 15, 1927  MRN: [id number]  Age / Sex: 86 y.o., male  PCP: [name], MD Referring Physician: Jennye Boroughs, MD  Reason for Consultation: Establishing goals of care and Psychosocial/spiritual support  HPI/Patient Profile: 86 y.o. male  with past medical history of hypertension, dyslipidemia, CVA with left-sided weakness, chronic urinary retention with indwelling Foley catheter, chronic kidney disease, atrial fibrillation on Eliquis admitted on 05/10/2019 with E. coli sepsis and bacteremia secondary to UTI, GI bleed with acute blood loss, patient family declined further endoscopic work-up.   Clinical Assessment and Goals of Care: Jordan Kline is resting quietly in bed.  He greets me making and mostly keeping eye contact.  He is somewhat disheveled, appears chronically ill and frail.  He is bedbound.  He is able to make his basic needs known, there is no family at bedside at this time.  He requested I speak with his wife.  Call to Jordan Kline to discuss patient condition, needs, disposition.  She shares that she feels like Jordan Kline will be the same as before he came to the hospital, needing lots of care.  She is agreeable to the plan to have home health resume, acceptable for antibiotics as prescribed, continue with outpatient palliative services.  She seems very knowledgeable about his health, and accepting of his expected declines.  She shares that son Jordan Kline is active in caring for Jordan Kline.  No issues or concerns, no questions at this time.  Jordan Kline is active with [facility] and outpatient palliative services, last seen through virtual visit with NP 05/05/2019.  Conference with medical team related to patient condition, needs, disposition.  HCPOA    NEXT OF KIN -spouse, Jordan Kline.  Son Jordan Kline is also involved in  care.   SUMMARY OF RECOMMENDATIONS   Continue to treat the treatable but no CPR, no intubation. Home with home health services [facility] outpatient palliative services already in place, active 05/05/2019.  Code Status/Advance Care Planning:  DNR  Symptom Management:   Per hospitalist, no additional needs at this time.  Palliative Prophylaxis:   Frequent Pain Assessment, Oral Care and Palliative Wound Care  Additional Recommendations (Limitations, Scope, Preferences):  Treat the treatable but no CPR, no intubation.  Rehospitalize as needed.  Active with outpatient palliative services  Psycho-social/Spiritual:   Desire for further Chaplaincy support:no  Additional Recommendations: Caregiving  Support/Resources and Education on Hospice  Prognosis:   < 6 months, would not be surprising based on bedbound status, frailty, recent diagnosis of sepsis due to UTI  Discharge Planning: Home with home health and [facility] outpatient palliative already in place.      Primary Diagnoses: Present on Admission: . Sepsis due to gram-negative UTI ([place]) . Sepsis secondary to UTI ([place]) . AKI (acute kidney injury) ([place]) . GIB (gastrointestinal bleeding) . Atrial fibrillation with RVR ([place])   I have reviewed the medical record, interviewed the patient and family, and examined the patient. The following aspects are  pertinent.  Past Medical History:  Diagnosis Date  . A-fib (HCC)    On Xarelto  . Chronic kidney disease (CKD)   . High cholesterol   . Hypertension   . Inguinal hernia   . Stroke [facility])    With left-sided hemiparesis  . Urinary retention    Chronic Foley catheter since December 2016   Social History   Socioeconomic History  . Marital status: Married    Spouse name: Not on file  . Number of children: 1  . Years of education: 36  . Highest education level: High school graduate  Occupational History  . Not on file  Tobacco Use  . Smoking status: Former  Games developer  . Smokeless tobacco: Never Used  . Tobacco comment: quit 50 years  Substance and Sexual Activity  . Alcohol use: No    Alcohol/week: 0.0 standard drinks  . Drug use: No  . Sexual activity: Never  Other Topics Concern  . Not on file  Social History Narrative   Lives at home with his wife. Bed bound at baseline.   Social Determinants of Health   Financial Resource Strain:   . Difficulty of Paying Living Expenses: Not on file  Food Insecurity:   . Worried About Programme researcher, broadcasting/film/video in the Last Year: Not on file  . Ran Out of Food in the Last Year: Not on file  Transportation Needs:   . Lack of Transportation (Medical): Not on file  . Lack of Transportation (Non-Medical): Not on file  Physical Activity:   . Days of Exercise per Week: Not on file  . Minutes of Exercise per Session: Not on file  Stress:   . Feeling of Stress : Not on file  Social Connections:   . Frequency of Communication with Friends and Family: Not on file  . Frequency of Social Gatherings with Friends and Family: Not on file  . Attends Religious Services: Not on file  . Active Member of Clubs or Organizations: Not on file  . Attends Banker Meetings: Not on file  . Marital Status: Not on file   Family History  Problem Relation Age of Onset  . Hypertension Father   . Kidney cancer Neg Hx   . Kidney disease Neg Hx   . Prostate cancer Neg Hx    Scheduled Meds: . acidophilus  1 capsule Oral BID  . atorvastatin  80 mg Oral QPM  . carvedilol  12.5 mg Oral BID  . Chlorhexidine Gluconate Cloth  6 each Topical Daily  . diltiazem  180 mg Oral QPM  . feeding supplement (NEPRO CARB STEADY)  237 mL Oral TID BM  . ipratropium-albuterol  3 mL Nebulization Q6H  . multivitamin with minerals  1 tablet Oral Daily   Continuous Infusions: .  ceFAZolin (ANCEF) IV 1 g (05/15/19 0501)  . dextrose Stopped (05/15/19 0500)   PRN Meds:.acetaminophen **OR** acetaminophen,  ipratropium-albuterol Medications Prior to Admission:  Prior to Admission medications   Medication Sig Start Date End Date Taking? Authorizing Provider  acetaminophen (TYLENOL) 325 MG tablet Take 650 mg by mouth every 4 (four) hours as needed. for pain/ increased temp. May be administered orally, per G-tube if needed or rectally if unable to swallow (separate order). Maximum dose for 24 hours is 3,000 mg from all sources of Acetaminophen/ Tylenol   Yes [provider]  Amino Acids-Protein Hydrolys (FEEDING SUPPLEMENT, PRO-STAT SUGAR FREE 64,) LIQD Take 30 mLs by mouth 2 (two) times daily between meals.  Yes [provider]  apixaban (ELIQUIS) 2.5 MG TABS tablet Take 1 tablet (2.5 mg total) by mouth 2 (two) times daily. 04/16/17  Yes Wieting, Richard, MD  atorvastatin (LIPITOR) 80 MG tablet Take 80 mg by mouth every evening.    Yes [provider]  carvedilol (COREG) 6.25 MG tablet Take 1 tablet (6.25 mg total) by mouth 2 (two) times daily. 04/16/17  Yes Wieting, Richard, MD  diltiazem (CARDIZEM CD) 120 MG 24 hr capsule Take 120 mg by mouth every evening.    Yes [provider]  docusate sodium (COLACE) 100 MG capsule Take 100 mg by mouth 2 (two) times daily.    Yes [provider]  ferrous sulfate 325 (65 FE) MG tablet Take 1 tablet (325 mg total) by mouth 2 (two) times daily with a meal. 03/04/15  Yes Auburn Bilberry, MD  food thickener (THICK IT) POWD Take by mouth. Use to mix thin liquids to nectar thick consistency as needed   Yes [provider]  Infant Care Products (DERMACLOUD) CREA Apply liberal amount topically to area of skin irritation prn. OK to leave at bedside.   Yes [provider]  Multiple Vitamin (MULTIVITAMIN WITH MINERALS) TABS tablet Take 1 tablet by mouth daily.   Yes [provider]  mupirocin cream (BACTROBAN) 2 % Apply thin film topically to open areas on penis 2 times a day until healed   Yes [provider]  Nutritional Supplements (ENSURE ENLIVE PO) Take 1 Bottle by mouth 3 (three) times daily. For 8 oz bottle, add 2 tablespoons of thickner. Stir for 15 seconds until dissolved.   Yes [provider]  Probiotic Product (RISA-BID PROBIOTIC) TABS Take 1 tablet by mouth 2 (two) times daily.   Yes [provider]  sacubitril-valsartan (ENTRESTO) 49-51 MG Take 1 tablet by mouth 2 (two) times daily. 04/16/17  Yes Alford Highland, MD   No Known Allergies Review of Systems  Unable to perform ROS: Age    Physical Exam Vitals and nursing note reviewed.  Constitutional:      General: He is not in acute distress.    Appearance: He is ill-appearing.     Comments: Makes it mostly keeps eye contact, appears quite frail  Cardiovascular:     Rate and Rhythm: Normal rate.  Pulmonary:     Effort: Pulmonary effort is normal. No respiratory distress.  Abdominal:     General: Abdomen is flat. There is no distension.  Musculoskeletal:     Comments: Severe muscle wasting, cachectic  Skin:    General: Skin is warm and dry.  Neurological:     Mental Status: He is alert.     Comments: Taking breathing treatment at this time, orientation questions not asked.  Psychiatric:     Comments: Calm and cooperative, not fearful     Vital Signs: BP (!) 162/91 (BP Location: Left Arm)   Pulse 75   Temp (!) 97.3 F (36.3 C) (Oral)   Resp 18   Ht 5\' 8"  (1.727 m)   Wt 58.9 kg   SpO2 99%   BMI 19.74 kg/m  Pain Scale: 0-10 POSS *See Group Information*: 1-Acceptable,Awake and alert Pain Score: 0-No pain   SpO2: SpO2: 99 % O2 Device:SpO2: 99 % O2 Flow Rate: .O2 Flow Rate (L/min): 2 L/min  IO: Intake/output summary:   Intake/Output Summary (Last 24 hours) at 05/15/2019 1408 Last data filed at 05/15/2019 0507 Gross per 24 hour  Intake 634.57 ml  Output 750  ml  Net -115.43 ml    LBM: Last BM Date: 05/13/19 Baseline Weight: Weight: 59 kg Most recent weight: Weight: 58.9 kg      Palliative Assessment/Data:   Flowsheet Rows     Most Recent Value  Intake Tab  Referral Department  Hospitalist  Unit at Time of Referral  Med/Surg Unit  Palliative Care Primary Diagnosis  Sepsis/Infectious Disease  Date Notified  05/14/19  Palliative Care Type  New Palliative care  Reason for referral  Clarify Goals of Care  Date of Admission  05/10/19  Date first seen by Palliative Care  05/14/19  # of days Palliative referral response time  0 Day(s)  # of days IP prior to Palliative referral  4  Clinical Assessment  Palliative Performance Scale Score  30%  Pain Max last 24 hours  Not able to report  Pain Min Last 24 hours  Not able to report  Dyspnea Max Last 24 Hours  Not able to report  Dyspnea Min Last 24 hours  Not able to report  Psychosocial & Spiritual Assessment  Palliative Care Outcomes      Time In: 1300  Time Out: 1350  Time Total: 50 minutes   Greater than 50%  of this time was spent counseling and coordinating care related to the above assessment and plan.  Signed by: Katheran Awe, NP   Please contact Palliative Medicine Team phone at [phone] for questions and concerns.  For individual provider: See Loretha Stapler

## 2019-05-15 NOTE — Progress Notes (Signed)
Patient is currently followed by [employer] community Palliative program at home. TOC Charlynn Court made aware. Dayna Barker BSN, RN, [facility] [employer] [phone]

## 2019-05-15 NOTE — Progress Notes (Signed)
Notified E. Ouma, NP of 14 beats of wide QRS, possibly PVC's, CCMD was not sure. No new orders. Windy Carina, RN 3:35 AM 05/15/2019

## 2019-05-15 NOTE — Progress Notes (Signed)
*  PRELIMINARY RESULTS* Echocardiogram 2D Echocardiogram has been performed.  Cristela Blue 05/15/2019, 9:39 AM

## 2019-05-15 NOTE — Care Management Important Message (Signed)
Important Message  Patient Details  Name: Jordan Kline MRN: [id number] Date of Birth: 1927-09-18   Medicare Important Message Given:  Yes     [name] 05/15/2019, 12:14 PM

## 2019-05-15 NOTE — Progress Notes (Signed)
MD was notified of 30 secs of V-tach about 100 beats run. MD acknowledged and made me aware that pt had had echo order due to this episode. No new order at this time.

## 2019-05-15 NOTE — Progress Notes (Addendum)
Progress Note    Jordan Kline  NWG:[id number] DOB: 1928-01-25  DOA: 05/10/2019 PCP: [name], S Ahmed, MD      Brief Narrative:    Medical records reviewed and are as summarized below:  [name]  is a 86 y.o. African-American male with a known history of hypertension, dyslipidemia, CVA, chronic urinary retention with indwelling Foley catheter, atrial fibrillation on Eliquis, who presented to the emergency room with acute onset of altered mental status.  Per his wife he had his indwelling Foley catheter replaced a few days prior to admission and was noted to be altered more than usual (for about 1 day prior to admission).  He had similar presentation with previous UTIs.  No reported nausea or vomiting or abdominal pain.  He was noted to have melena in the ER.   Upon presentation to the emergency room, vital signs revealed blood pressure of 78/53 with a pulse of 117 and respirate 33 with pulse oximetry 100% on room air attention 98.7.  Labs revealed BUN of 122 with a creatinine of 4.44 compared to 66 and 1.58, lactic acid of 2 and CBC with leukocytosis 12.3 with neutrophilia as well as anemia with hemoglobin of 7 hematocrit 23.4 compared to 10 and 29.2 in January 2019.  INR is 1.9 with PTT 21.4.  Urinalysis was strongly positive for UTI.  The patient had grossly melanotic stool upon rectal exam by ER physician with positive stool Hemoccult.     Assessment/Plan:   Active Problems:   GIB (gastrointestinal bleeding)   Sepsis due to gram-negative UTI (HCC)   Sepsis secondary to UTI (HCC)   Pressure injury of skin   AKI (acute kidney injury) (HCC)   Atrial fibrillation with RVR (HCC)   Chronic CHF (HCC)   Hypernatremia   E. coli sepsis and bacteremia secondary to UTI/worsening leukocytosis: Continue IV Ancef.  GI bleeding with acute blood loss anemia: Patient and family have decided against any endoscopic work-up.  GI has signed off.  Monitor H&H and transfuse as  needed.   Hypernatremia: Continue low rate IV fluids.  PVCs/nonsustained V. tach on telemetry: 2D echo is pending.  History of CHF: Specifics unknown.  Entresto on hold.  Paroxysmal atrial fibrillation: Eliquis on hold because of GI bleed.  Continue Cardizem and carvedilol.  Uncontrolled hypertension: Continue carvedilol and Cardizem.  AKI on CKD stage IIIb: Continue IV fluids.  BUN and creatinine were 66 and 1.48 respectively on 04/19/2017.  Toxic metabolic encephalopathy: Improved  Chronic urinary retention: Patient has Foley catheter in place.     Pressure Injury 05/11/19 Buttocks Medial;Mid Stage 1 -  Intact skin with non-blanchable redness of a localized area usually over a bony prominence. non-blenchable (Active)  05/11/19 1100  Location: Buttocks  Location Orientation: Medial;Mid  Staging: Stage 1 -  Intact skin with non-blanchable redness of a localized area usually over a bony prominence.  Wound Description (Comments): non-blenchable  Present on Admission: Yes         Body mass index is 19.74 kg/m.   Family Communication/Anticipated D/C date and plan/Code Status   DVT prophylaxis: SCDs Code Status: DNR  family Communication: Plan discussed with his wife Disposition Plan: Patient is from home and plan is to discharge patient back home once medically stable for discharge.  Patient is still hypernatremic and creatinine is not back to baseline.  IV fluids has to be given slowly and cautiously to avoid fluid overload.  Possible discharge in 2 to 3 days  Subjective:   No complaints.  No dizziness, shortness of breath or chest pain.  Objective:    Vitals:   05/14/19 1717 05/14/19 2240 05/14/19 2240 05/15/19 0507  BP: (!) 162/95 (!) 142/83  (!) 147/95  Pulse: 88 (!) 110 60 76  Resp: (!) 23 (!) 24  (!) 24  Temp:  98 F (36.7 C)  98.4 F (36.9 C)  TempSrc:  Oral    SpO2: 100% 100% 99% 100%  Weight:      Height:        Intake/Output Summary  (Last 24 hours) at 05/15/2019 1137 Last data filed at 05/15/2019 0507 Gross per 24 hour  Intake 634.57 ml  Output 1400 ml  Net -765.43 ml   Filed Weights   05/10/19 2130 05/11/19 0709  Weight: 59 kg 58.9 kg    Exam:  GEN: NAD SKIN: No rash EYES: No pallor or icterus ENT: MMM CV: RRR PULM: Bilateral expiratory wheezing, no rales heard ABD: soft, ND, NT, +BS CNS: AAO x 3, 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51*  --  3.36*  --  3.33*  --  3.24* 2.90*  CALCIUM 7.6*  --  7.6*  --  7.9*  --  8.4* 8.4*  MG  --   --   --   --   --   --   --  2.4   < > = values in this interval not displayed.   GFR Estimated Creatinine Clearance: 14.1 mL/min (A) (by C-G formula based on SCr of 2.9 mg/dL (H)). Liver Function Tests: Recent Labs  Lab 05/10/19 2136 05/11/19 0615  AST 24 35  ALT 22 28  ALKPHOS 133* 81  BILITOT 0.8 1.0  PROT 5.6* 5.6*  ALBUMIN 2.6* 2.5*   No results for input(s): LIPASE, AMYLASE in the last 168 hours. No results for input(s): AMMONIA in the last 168 hours. Coagulation profile Recent Labs  Lab 05/10/19 2136 05/11/19 0615  INR 1.9* 2.2*    CBC: Recent Labs  Lab 05/10/19 2136 05/10/19 2136 05/11/19 0615 05/11/19 1348 05/11/19 1902 05/12/19 0428 05/13/19 0605 05/14/19 0633 05/15/19 0530  WBC 12.3*   < > 21.5*  --    --  18.5* 19.5* 11.9* 19.7*  NEUTROABS 11.6*  --   --   --   --  15.8* 17.1* 11.0* 17.5*  HGB 7.0*   < > 10.2*   < > 10.4* 10.2* 9.8* 10.3* 10.2*  HCT 23.4*   < > 31.9*  --   --  31.4* 29.5* 32.2* 31.4*  MCV 107.3*   < >  97.9  --   --  96.9 95.5 97.3 96.3  PLT 94*   < > 72*  --   --  80* 84* 83* 92*   < > = values in this interval not displayed.   Cardiac Enzymes: No results for input(s): CKTOTAL, CKMB, CKMBINDEX, TROPONINI in the last 168 hours. BNP (last 3 results) No results for input(s): PROBNP in the last 8760 hours. CBG: No results for input(s): GLUCAP in the last 168 hours. D-Dimer: No results for input(s): DDIMER in the last 72 hours. Hgb A1c: No results for input(s): HGBA1C in the last 72 hours. Lipid Profile: No results for input(s): CHOL, HDL, LDLCALC, TRIG, CHOLHDL, LDLDIRECT in the last 72 hours. Thyroid function studies: No results for input(s): TSH, T4TOTAL, T3FREE, THYROIDAB in the last 72 hours.  Invalid input(s): FREET3 Anemia work up: No results for input(s): VITAMINB12, FOLATE, FERRITIN, TIBC, IRON, RETICCTPCT in the last 72 hours. Sepsis Labs: Recent Labs  Lab 05/10/19 2136 05/10/19 2136 05/10/19 2321 05/11/19 0615 05/11/19 0615 05/11/19 1348 05/11/19 1736 05/12/19 0428 05/13/19 0605 05/14/19 0633 05/15/19 0530  PROCALCITON  --   --   --  >150.00  --   --   --   --   --   --   --   WBC 12.3*   < >  --  21.5*   < >  --   --  18.5* 19.5* 11.9* 19.7*  LATICACIDVEN 2.0*  --  2.9*  --   --  1.6 1.0  --   --   --   --    < > = values in this interval not displayed.    Microbiology Recent Results (from the past 240 hour(s))  Culture, blood (Routine x 2)     Status: Abnormal   Collection Time: 05/10/19  9:35 PM   Specimen: BLOOD  Result Value Ref Range Status   Specimen Description   Final    BLOOD RIGHT FOREARM Performed at [facility], [address], [place], [place] [address]    Special Requests   Final    BOTTLES DRAWN AEROBIC AND  ANAEROBIC Blood Culture adequate volume Performed at [facility], [address] [place], [place] [address]    Culture  Setup Time   Final    GRAM NEGATIVE RODS IN BOTH AEROBIC AND ANAEROBIC BOTTLES CRITICAL RESULT CALLED TO, READ BACK BY AND VERIFIED WITH:  JODY BAREFOOT ON 05/11/2019 AT 0939 TIK    Culture (A)  Final    ESCHERICHIA COLI SUSCEPTIBILITIES PERFORMED ON PREVIOUS CULTURE WITHIN THE LAST 5 DAYS. Performed at [facility], [address] [place], [place] [address]    Report Status 05/13/2019 FINAL  Final  Blood Culture ID Panel (Reflexed)     Status: Abnormal   Collection Time: 05/10/19  9:35 PM  Result Value Ref Range Status   Enterococcus species NOT DETECTED NOT DETECTED Final   Listeria monocytogenes NOT DETECTED NOT DETECTED Final   Staphylococcus species NOT DETECTED NOT DETECTED Final   Staphylococcus aureus (BCID) NOT DETECTED NOT DETECTED Final   Streptococcus species NOT DETECTED NOT DETECTED Final   Streptococcus agalactiae NOT DETECTED NOT DETECTED Final   Streptococcus pneumoniae NOT DETECTED NOT DETECTED Final   Streptococcus pyogenes NOT DETECTED NOT DETECTED Final   Acinetobacter baumannii NOT DETECTED NOT DETECTED Final   Enterobacteriaceae species DETECTED (A) NOT DETECTED Final    Comment: Enterobacteriaceae represent a large family of gram-negative bacteria, not a single organism.  CRITICAL RESULT CALLED TO, READ BACK BY AND VERIFIED WITH:  JODY BAREFOOT ON 05/11/2019 AT 0939 TIK    Enterobacter cloacae complex NOT DETECTED NOT DETECTED Final   Escherichia coli DETECTED (A) NOT DETECTED Final    Comment: CRITICAL RESULT CALLED TO, READ BACK BY AND VERIFIED WITH: JODY BAREFOOT ON 05/11/2019 AT 0939 TIK    Klebsiella oxytoca NOT DETECTED NOT DETECTED Final   Klebsiella pneumoniae NOT DETECTED NOT DETECTED Final   Proteus species NOT DETECTED NOT DETECTED Final   Serratia marcescens NOT DETECTED NOT DETECTED Final   Carbapenem  resistance NOT DETECTED NOT DETECTED Final   Haemophilus influenzae NOT DETECTED NOT DETECTED Final   Neisseria meningitidis NOT DETECTED NOT DETECTED Final   Pseudomonas aeruginosa NOT DETECTED NOT DETECTED Final   Candida albicans NOT DETECTED NOT DETECTED Final   Candida glabrata NOT DETECTED NOT DETECTED Final   Candida krusei NOT DETECTED NOT DETECTED Final   Candida parapsilosis NOT DETECTED NOT DETECTED Final   Candida tropicalis NOT DETECTED NOT DETECTED Final    Comment: Performed at [facility], [address] [place], [place] [address]  Culture, blood (Routine x 2)     Status: Abnormal   Collection Time: 05/10/19  9:36 PM   Specimen: BLOOD  Result Value Ref Range Status   Specimen Description   Final    BLOOD RIGHT ANTECUBITAL Performed at [facility], [address] [place], [place] [address]    Special Requests   Final    BOTTLES DRAWN AEROBIC AND ANAEROBIC Blood Culture adequate volume Performed at [facility], [address] [place], [place] [address]    Culture  Setup Time   Final    GRAM NEGATIVE RODS IN BOTH AEROBIC AND ANAEROBIC BOTTLES CRITICAL RESULT CALLED TO, READ BACK BY AND VERIFIED WITH: JODY BAREFOOT ON 05/11/2019 AT 0939 TIK Performed at [facility], [address] [place], [place] [address]    Culture ESCHERICHIA COLI (A)  Final   Report Status 05/13/2019 FINAL  Final   Organism ID, Bacteria ESCHERICHIA COLI  Final      Susceptibility   Escherichia coli - MIC*    AMPICILLIN <=2 SENSITIVE Sensitive     CEFAZOLIN <=4 SENSITIVE Sensitive     CEFEPIME <=0.12 SENSITIVE Sensitive     CEFTAZIDIME <=1 SENSITIVE Sensitive     CEFTRIAXONE <=0.25 SENSITIVE Sensitive     CIPROFLOXACIN <=0.25 SENSITIVE Sensitive     GENTAMICIN <=1 SENSITIVE Sensitive     IMIPENEM <=0.25 SENSITIVE Sensitive     TRIMETH/SULFA <=20 SENSITIVE Sensitive     AMPICILLIN/SULBACTAM <=2 SENSITIVE Sensitive     PIP/TAZO <=4 SENSITIVE  Sensitive     * ESCHERICHIA COLI  Respiratory Panel by RT PCR (Flu A&B, Covid) - Nasopharyngeal Swab     Status: None   Collection Time: 05/10/19  9:36 PM   Specimen: Nasopharyngeal Swab  Result Value Ref Range Status   SARS Coronavirus 2 by RT PCR NEGATIVE NEGATIVE Final    Comment: (NOTE) SARS-CoV-2 target nucleic acids are NOT DETECTED. The SARS-CoV-2 RNA is generally detectable in upper respiratoy specimens during the acute phase of infection. The lowest concentration of SARS-CoV-2 viral copies this assay can detect is 131 copies/mL. A negative result does not preclude SARS-Cov-2 infection and should not be used as the sole basis for treatment or other patient management decisions. A negative result may occur with  improper specimen collection/handling, submission of specimen other than nasopharyngeal swab, presence of viral mutation(s)  within the areas targeted by this assay, and inadequate number of viral copies (<131 copies/mL). A negative result must be combined with clinical observations, patient history, and epidemiological information. The expected result is Negative. Fact Sheet for Patients:  https://www.moore.com/https://www.fda.gov/media/142436/download Fact Sheet for Healthcare Providers:  https://www.young.biz/https://www.fda.gov/media/142435/download This test is not yet ap proved or cleared by the [place] FDA and  has been authorized for detection and/or diagnosis of SARS-CoV-2 by FDA under an Emergency Use Authorization (EUA). This EUA will remain  in effect (meaning this test can be used) for the duration of the COVID-19 declaration under Section 564(b)(1) of the Act, 21 U.S.C. section 360bbb-3(b)(1), unless the authorization is terminated or revoked sooner.    Influenza A by PCR NEGATIVE NEGATIVE Final   Influenza B by PCR NEGATIVE NEGATIVE Final    Comment: (NOTE) The Xpert Xpress SARS-CoV-2/FLU/RSV assay is intended as an aid in  the diagnosis of influenza from Nasopharyngeal swab specimens and   should not be used as a sole basis for treatment. Nasal washings and  aspirates are unacceptable for Xpert Xpress SARS-CoV-2/FLU/RSV  testing. Fact Sheet for Patients: https://www.moore.com/https://www.fda.gov/media/142436/download Fact Sheet for Healthcare Providers: https://www.young.biz/https://www.fda.gov/media/142435/download This test is not yet approved or cleared by the [place] FDA and  has been authorized for detection and/or diagnosis of SARS-CoV-2 by  FDA under an Emergency Use Authorization (EUA). This EUA will remain  in effect (meaning this test can be used) for the duration of the  Covid-19 declaration under Section 564(b)(1) of the Act, 21  U.S.C. section 360bbb-3(b)(1), unless the authorization is  terminated or revoked. Performed at [facility], [address] [place], [place] [address]   Urine Culture     Status: Abnormal   Collection Time: 05/10/19  9:36 PM   Specimen: Urine, Random  Result Value Ref Range Status   Specimen Description   Final    URINE, RANDOM Performed at [facility], [address] [place], [place] [address]    Special Requests   Final    NONE Performed at [facility], [address] [place], [place] [address]    Culture >=100,000 COLONIES/mL ESCHERICHIA COLI (A)  Final   Report Status 05/13/2019 FINAL  Final   Organism ID, Bacteria ESCHERICHIA COLI (A)  Final      Susceptibility   Escherichia coli - MIC*    AMPICILLIN <=2 SENSITIVE Sensitive     CEFAZOLIN <=4 SENSITIVE Sensitive     CEFTRIAXONE <=0.25 SENSITIVE Sensitive     CIPROFLOXACIN <=0.25 SENSITIVE Sensitive     GENTAMICIN <=1 SENSITIVE Sensitive     IMIPENEM <=0.25 SENSITIVE Sensitive     NITROFURANTOIN <=16 SENSITIVE Sensitive     TRIMETH/SULFA <=20 SENSITIVE Sensitive     AMPICILLIN/SULBACTAM <=2 SENSITIVE Sensitive     PIP/TAZO <=4 SENSITIVE Sensitive     * >=100,000 COLONIES/mL ESCHERICHIA COLI    Procedures and diagnostic studies:  DG Chest Port 1 View  Result  Date: 05/13/2019 CLINICAL DATA:  Inpatient, wheezing, hx of stroke and hypertension EXAM: PORTABLE CHEST 1 VIEW COMPARISON:  None. FINDINGS: Stable cardiac silhouette. Mild central venous congestion. No effusion, infiltrate pneumothorax. Ectatic aorta. No acute osseous abnormality. IMPRESSION: Venous pulmonary congestion similar to prior. Electronically Signed   By: Genevive BiStewart  Edmunds M.D.   On: 05/13/2019 13:13   ECHOCARDIOGRAM COMPLETE  Result Date: 05/15/2019    ECHOCARDIOGRAM REPORT   Patient Name:   Jordan Kline Date of Exam: 05/15/2019 Medical Rec #:  [id number]  Height:       68.0 in Accession #:    [id number]       Weight:       129.9 lb Date of Birth:  1928-01-16        BSA:          1.70 m Patient Age:    86 years         BP:           147/95 mmHg Patient Gender: M                HR:           76 bpm. Exam Location:  [facility] Procedure: 2D Echo, Cardiac Doppler and Color Doppler Indications:     CHF 428.0 Ventricular tachycardia 147.2  History:         Patient has no prior history of Echocardiogram examinations.                  Stroke, Arrythmias:Atrial Fibrillation; Risk                  Factors:Hypertension.  Sonographer:     Cristela Blue RDCS (AE) Referring Phys:  [id number] Lurene Shadow Diagnosing Phys: Julien Nordmann MD IMPRESSIONS  1. Left ventricular ejection fraction, by estimation, is 50 to 55%. The left ventricle has low normal function. The left ventricle has no regional wall motion abnormalities. Left ventricular diastolic parameters are indeterminate.  2. Right ventricular systolic function is normal. The right ventricular size is mildly enlarged. There is severely elevated pulmonary artery systolic pressure.  3. Left atrial size was severely dilated.  4. Right atrial size was severely dilated.  5. Moderate to severe mitral valve regurgitation.  6. Tricuspid valve regurgitation is moderate to severe.  7. The inferior vena cava is dilated in size with <50% respiratory variability, suggesting  right atrial pressure of 15 mmHg. FINDINGS  Left Ventricle: Left ventricular ejection fraction, by estimation, is 50 to 55%. The left ventricle has low normal function. The left ventricle has no regional wall motion abnormalities. The left ventricular internal cavity size was normal in size. There is no left ventricular hypertrophy. Left ventricular diastolic parameters are indeterminate. Right Ventricle: The right ventricular size is mildly enlarged. No increase in right ventricular wall thickness. Right ventricular systolic function is normal. There is severely elevated pulmonary artery systolic pressure. The tricuspid regurgitant velocity is 4.15 m/s, and with an assumed right atrial pressure of 15 mmHg, the estimated right ventricular systolic pressure is 83.9 mmHg. Left Atrium: Left atrial size was severely dilated. Right Atrium: Right atrial size was severely dilated. Pericardium: There is no evidence of pericardial effusion. Mitral Valve: The mitral valve is normal in structure and function. Normal mobility of the mitral valve leaflets. Moderate to severe mitral valve regurgitation. No evidence of mitral valve stenosis. Tricuspid Valve: The tricuspid valve is normal in structure. Tricuspid valve regurgitation is moderate to severe. No evidence of tricuspid stenosis. Aortic Valve: The aortic valve is normal in structure and function. Aortic valve regurgitation is not visualized. Mild to moderate aortic valve sclerosis/calcification is present, without any evidence of aortic stenosis. Aortic valve mean gradient measures 3.2 mmHg. Aortic valve peak gradient measures 5.1 mmHg. Aortic valve area, by VTI measures 1.81 cm. Pulmonic Valve: The pulmonic valve was normal in structure. Pulmonic valve regurgitation is not visualized. No evidence of pulmonic stenosis. Aorta: The aortic root is normal in size and structure. Venous: The inferior vena cava is dilated in  size with less than 50% respiratory variability,  suggesting right atrial pressure of 15 mmHg. IAS/Shunts: No atrial level shunt detected by color flow Doppler.  LEFT VENTRICLE PLAX 2D LVIDd:         4.67 cm LVIDs:         3.21 cm LV PW:         1.18 cm LV IVS:        0.85 cm LVOT diam:     2.10 cm LV SV:         39.83 ml LV SV Index:   35.58 LVOT Area:     3.46 cm  RIGHT VENTRICLE RV Basal diam:  3.01 cm RV S prime:     12.30 cm/s TAPSE (M-mode): 3.4 cm LEFT ATRIUM             Index       RIGHT ATRIUM           Index LA diam:        5.40 cm 3.17 cm/m  RA Area:     25.60 cm LA Vol (A2C):   55.9 ml 32.86 ml/m RA Volume:   78.40 ml  46.09 ml/m LA Vol (A4C):   76.2 ml 44.80 ml/m LA Biplane Vol: 70.6 ml 41.51 ml/m  AORTIC VALVE                   PULMONIC VALVE AV Area (Vmax):    1.79 cm    PV Vmax:        0.64 m/s AV Area (Vmean):   1.85 cm    PV Peak grad:   1.6 mmHg AV Area (VTI):     1.81 cm    RVOT Peak grad: 4 mmHg AV Vmax:           113.25 cm/s AV Vmean:          78.450 cm/s AV VTI:            0.220 m AV Peak Grad:      5.1 mmHg AV Mean Grad:      3.2 mmHg LVOT Vmax:         58.40 cm/s LVOT Vmean:        41.800 cm/s LVOT VTI:          0.115 m LVOT/AV VTI ratio: 0.52  AORTA Ao Root diam: 3.20 cm MITRAL VALVE                TRICUSPID VALVE MV Area (PHT): 4.36 cm     TR Peak grad:   68.9 mmHg MV Decel Time: 174 msec     TR Vmax:        415.00 cm/s MV E velocity: 107.00 cm/s                             SHUNTS                             Systemic VTI:  0.12 m                             Systemic Diam: 2.10 cm Julien Nordmann MD Electronically signed by Julien Nordmann MD Signature Date/Time: 05/15/2019/11:22:46 AM    Final     Medications:   . acidophilus  1 capsule Oral BID  . atorvastatin  80 mg  Oral QPM  . carvedilol  12.5 mg Oral BID  . Chlorhexidine Gluconate Cloth  6 each Topical Daily  . diltiazem  180 mg Oral QPM  . feeding supplement (NEPRO CARB STEADY)  237 mL Oral TID BM  . multivitamin with minerals  1 tablet Oral Daily   Continuous  Infusions: .  ceFAZolin (ANCEF) IV 1 g (05/15/19 0501)  . dextrose Stopped (05/15/19 0500)     LOS: 5 days   Benyamin Jeff  [facility]     05/15/2019, 11:37 AM

## 2019-05-15 NOTE — Evaluation (Signed)
Physical Therapy Evaluation Patient Details Name: Jordan Kline MRN: [id number] DOB: [birth year] Today's Date: 05/15/2019   History of Present Illness  Perry Brucato is a 89yoM who comes to [facility] on 2/13 c AMS. Wife suspected UTI. In ED noted to have anemia and soft BP. Patient had 13 beat run of nonsustained V. tach on telemetry on 2/17. PMH: hypertension, dyslipidemia, CVA, AF, and chronic indwelling Foley catheter. Per CSW note, wife provides caregiver assistance at home, pt largely bed bound, has HHRN.  Clinical Impression  Pt admitted with above diagnosis. Pt currently with functional limitations due to the deficits listed below (see "PT Problem List"). Upon entry, pt in bed, awake and agreeable to participate. Pt HOH so some questioning is made difficult, otherwise pt able to provide some details regarding home set up and PLOF. Pt reports mostly being bed bound at home, will transfer to [facility] for medical appointments- correlates with CSW note. Pt last seen by our services in 2019 >2YA, notes indicated pt had been AMB up to short household distances with QC in late 2018. The pt is alert and oriented to self, pleasant. Declines OOB to recliner for assessment as he doesn't have his AFO, also doesn't feel like it. Pt agreeable for assistance for reposition in bed, as he is unable to with RUE alone. Max-total assist for rolling and scooting up in bed. Functional mobility assessment demonstrates increased effort/time requirements, poor tolerance, and need for physical assistance, whereas the patient performed these at a higher level of independence PTA. Appears likely that pt is at his baseline level of independence for basic mobility. Will plan to keep on caseload if pt/wife are agreeable to work on transfers OOB to maintain baseline function, but will need his AFO to do so. Otherwise, pt's needs for rehab and DME can continue to be managed by in-home palliative care. Pt will benefit from skilled PT  intervention to increase independence and safety with basic mobility in preparation for discharge to the venue listed below.       Follow Up Recommendations Supervision - Intermittent;Supervision for mobility/OOB;Other (comment)(home services managed by palliative care)    Equipment Recommendations  None recommended by PT;Other (comment)(may need mechanical lift is unable to pivot to chair with wife)    Recommendations for Other Services       Precautions / Restrictions Precautions Precautions: Fall Restrictions Weight Bearing Restrictions: No      Mobility  Bed Mobility Overal bed mobility: Needs Assistance             General bed mobility comments: total assist for repositioning inbed; has strong RUE and attempts to assist but leverage is not in his favor for utmost utility.  Transfers Overall transfer level: (Pt declines; reports to be largely bed bound, but typically uses his AFO for stand pivot transfers.)                  Ambulation/Gait                Stairs            Wheelchair Mobility    Modified Rankin (Stroke Patients Only)       Balance                                             Pertinent Vitals/Pain Pain Assessment: No/denies pain    Home Living  Family/patient expects to be discharged to:: Private residence Living Arrangements: Spouse/significant other Available Help at Discharge: Family Type of Home: House Home Access: [place]: One level   Additional Comments: In past was using transport chair/WC, had a ramp, largely bed bound, required assistance with all ADL.    Prior Function Level of Independence: Needs assistance               Hand Dominance   Dominant Hand: Right(Left contracture, hemiplegia.)    Extremity/Trunk Assessment   Upper Extremity Assessment Upper Extremity Assessment: RUE deficits/detail;LUE deficits/detail LUE Deficits / Details: Chronic  contracture, hemiparesis    Lower Extremity Assessment Lower Extremity Assessment: LLE deficits/detail LLE Deficits / Details: chronic weakness, uses AFO at baseline       Communication      Cognition Arousal/Alertness: Awake/alert Behavior During Therapy: WFL for tasks assessed/performed Overall Cognitive Status: History of cognitive impairments - at baseline                                        General Comments      Exercises     Assessment/Plan    PT Assessment    PT Problem List Decreased activity tolerance;Decreased mobility       PT Treatment Interventions      PT Goals (Current goals can be found in the Care Plan section)  Acute Rehab PT Goals PT Goal Formulation: Patient unable to participate in goal setting Time For Goal Achievement: 05/29/19    Frequency     Barriers to discharge        Co-evaluation               AM-PAC PT "6 Clicks" Mobility  Outcome Measure Help needed turning from your back to your side while in a flat bed without using bedrails?: A Lot Help needed moving from lying on your back to sitting on the side of a flat bed without using bedrails?: A Lot Help needed moving to and from a bed to a chair (including a wheelchair)?: Total Help needed standing up from a chair using your arms (e.g., wheelchair or bedside chair)?: Total Help needed to walk in hospital room?: Total Help needed climbing 3-5 steps with a railing? : Total 6 Click Score: 8    End of Session Equipment Utilized During Treatment: Oxygen Activity Tolerance: Patient tolerated treatment well;No increased pain Patient left: in bed;with call bell/phone within reach Nurse Communication: Mobility status PT Visit Diagnosis: Muscle weakness (generalized) (M62.81);Other abnormalities of gait and mobility (R26.89);Unsteadiness on feet (R26.81)    Time: 1350-1400 PT Time Calculation (min) (ACUTE ONLY): 10 min   Charges:   PT Evaluation $PT Eval Low  Complexity: 1 Low          2:40 PM, 05/15/19 [name], PT, DPT Physical Therapist - [facility]  [phone] ([place])    Beaver C 05/15/2019, 2:35 PM

## 2019-05-15 NOTE — Evaluation (Signed)
Occupational Therapy Evaluation Patient Details Name: Jordan Kline MRN: [id number] DOB: Sep 23, 1927 Today's Date: 05/15/2019    History of Present Illness Jordan Kline is a 69yoM who comes to [facility] on 2/13 c AMS. Wife suspected UTI. In ED noted to have anemia and soft BP. Patient had 13 beat run of nonsustained V. tach on telemetry on 2/17. PMH: hypertension, dyslipidemia, CVA, AF, and chronic indwelling Foley catheter. Per CSW note, wife provides caregiver assistance at home, pt largely bed bound, has [place].   Clinical Impression   Patient seen this afternoon for occupational therapy evaluation.  Patient agreeable to session but unmotivated to participate in therapeutic activities.  Per pt and wife, pt's needs are largely taken care of by his wife and palliative homecare/[facility].  Patient is mostly bed bound but will use a transport wheelchair for doctor's appointments.  Per wife, he is able to feed himself and perform simple grooming.  Patient is near PLOF but would benefit from further education regarding safety and positioning while in bed to reduce risk for skin breakdown.  Wife would also benefit from this education.  Based on today's performance, recommending previous set up of assistance at home with palliative care upon discharge.      Follow Up Recommendations  Supervision/Assistance - 24 hour;Other (comment)(Home services management by palliative care/[facility])    Equipment Recommendations  (hoyer lift)    Recommendations for Other Services       Precautions / Restrictions Precautions Precautions: Fall Required Braces or Orthoses: Other Brace Other Brace: Patient states he has an AFO Restrictions Weight Bearing Restrictions: No      Mobility Bed Mobility Overal bed mobility: Needs Assistance             General bed mobility comments: MAX-TOTAL A for rolling and repositioning.  Attempts to assist with R UE.  Transfers                General transfer  comment: Unable to test at this time.    Balance                                           ADL either performed or assessed with clinical judgement   ADL Overall ADL's : Needs assistance/impaired Eating/Feeding: Minimal assistance Eating/Feeding Details (indicate cue type and reason): Requires assistance for cutting foods and openning items Grooming: Wash/dry hands;Oral care;Maximal assistance;Wash/dry face;Minimal assistance                                 General ADL Comments: Patient's wife performs all ADLs.  Per wife, patient is able to feed self and do simple grooming.  Patient uses catheter and brief for toileting and bed baths.     Vision Baseline Vision/History: Wears glasses Wears Glasses: At all times       Perception     Praxis      Pertinent Vitals/Pain Pain Assessment: No/denies pain     Hand Dominance Right   Extremity/Trunk Assessment Upper Extremity Assessment Upper Extremity Assessment: Generalized weakness;LUE deficits/detail RUE Sensation: WNL RUE Coordination: WNL LUE Deficits / Details: Chronic contracture, pt has no splint.  Wrist in flexion, MCP neutral and PIP/DIP in flexion   Lower Extremity Assessment Lower Extremity Assessment: Defer to PT evaluation LLE Deficits / Details: chronic weakness, uses AFO at baseline  Communication Communication Communication: HOH   Cognition Arousal/Alertness: Awake/alert Behavior During Therapy: WFL for tasks assessed/performed Overall Cognitive Status: History of cognitive impairments - at baseline                                     General Comments  Refused to sit at [facility]    Exercises     Shoulder Instructions      Home Living Family/patient expects to be discharged to:: Private residence Living Arrangements: Spouse/significant other Available Help at Discharge: Family Type of Home: House Home Access: Ramped entrance     Home Layout: One  level               Home Equipment: Cane - quad;Wheelchair - manual   Additional Comments: Requires assistance for all ADLs.  Per wife, he only leaves the bed for doctor's appointments.      Prior Functioning/Environment Level of Independence: Needs assistance                 OT Problem List: Decreased strength;Decreased range of motion;Decreased activity tolerance      OT Treatment/Interventions: Therapeutic activities;Patient/family education;Self-care/ADL training;Therapeutic exercise    OT Goals(Current goals can be found in the care plan section) Acute Rehab OT Goals Patient Stated Goal: Go home OT Goal Formulation: With patient Time For Goal Achievement: 05/29/19 Potential to Achieve Goals: Good  OT Frequency: Min 1X/week   Barriers to D/C:            Co-evaluation              AM-PAC OT "6 Clicks" Daily Activity     Outcome Measure Help from another person eating meals?: A Lot Help from another person taking care of personal grooming?: A Lot Help from another person toileting, which includes using toliet, bedpan, or urinal?: Total   Help from another person to put on and taking off regular upper body clothing?: Total Help from another person to put on and taking off regular lower body clothing?: Total 6 Click Score: 7   End of Session Nurse Communication: Other (comment)(Discussed lab values with nurse prior to evaluation)  Activity Tolerance: Other (comment)(Patient with little interest in participation) Patient left: in bed;with call bell/phone within reach;with bed alarm set  OT Visit Diagnosis: Muscle weakness (generalized) (M62.81)                Time: 1405-1430 OT Time Calculation (min): 25 min Charges:  OT General Charges $OT Visit: 1 Visit OT Evaluation $OT Eval Low Complexity: 1 Low OT Treatments $Therapeutic Activity: 23-37 mins  Louanne Belton, MS, OTR/L 05/15/19, 3:15 PM

## 2019-05-16 DIAGNOSIS — I5032 Chronic diastolic (congestive) heart failure: Secondary | ICD-10-CM

## 2019-05-16 LAB — CBC WITH DIFFERENTIAL/PLATELET
Abs Immature Granulocytes: 0.22 10*3/uL — ABNORMAL HIGH (ref 0.00–0.07)
Basophils Absolute: 0 10*3/uL (ref 0.0–0.1)
Basophils Relative: 0 %
Eosinophils Absolute: 0 10*3/uL (ref 0.0–0.5)
Eosinophils Relative: 0 %
HCT: 32.3 % — ABNORMAL LOW (ref 39.0–52.0)
Hemoglobin: 10.5 g/dL — ABNORMAL LOW (ref 13.0–17.0)
Immature Granulocytes: 1 %
Lymphocytes Relative: 3 %
Lymphs Abs: 0.6 10*3/uL — ABNORMAL LOW (ref 0.7–4.0)
MCH: 31.1 pg (ref 26.0–34.0)
MCHC: 32.5 g/dL (ref 30.0–36.0)
MCV: 95.6 fL (ref 80.0–100.0)
Monocytes Absolute: 0.5 10*3/uL (ref 0.1–1.0)
Monocytes Relative: 3 %
Neutro Abs: 16.9 10*3/uL — ABNORMAL HIGH (ref 1.7–7.7)
Neutrophils Relative %: 93 %
Platelets: 85 10*3/uL — ABNORMAL LOW (ref 150–400)
RBC: 3.38 MIL/uL — ABNORMAL LOW (ref 4.22–5.81)
RDW: 17.7 % — ABNORMAL HIGH (ref 11.5–15.5)
WBC: 18.3 10*3/uL — ABNORMAL HIGH (ref 4.0–10.5)
nRBC: 0.2 % (ref 0.0–0.2)

## 2019-05-16 LAB — BASIC METABOLIC PANEL
Anion gap: 7 (ref 5–15)
BUN: 95 mg/dL — ABNORMAL HIGH (ref 8–23)
CO2: 21 mmol/L — ABNORMAL LOW (ref 22–32)
Calcium: 8.4 mg/dL — ABNORMAL LOW (ref 8.9–10.3)
Chloride: 125 mmol/L — ABNORMAL HIGH (ref 98–111)
Creatinine, Ser: 2.44 mg/dL — ABNORMAL HIGH (ref 0.61–1.24)
GFR calc Af Amer: 26 mL/min — ABNORMAL LOW (ref >60)
GFR calc non Af Amer: 22 mL/min — ABNORMAL LOW (ref >60)
Glucose, Bld: 123 mg/dL — ABNORMAL HIGH (ref 70–99)
Potassium: 3.9 mmol/L (ref 3.5–5.1)
Sodium: 153 mmol/L — ABNORMAL HIGH (ref 135–145)

## 2019-05-16 MED ORDER — IPRATROPIUM-ALBUTEROL 0.5-2.5 (3) MG/3ML IN SOLN
3.0000 mL | Freq: Three times a day (TID) | RESPIRATORY_TRACT | Status: DC
  Administered 2019-05-16 – 2019-05-18 (×5): 3 mL via RESPIRATORY_TRACT
  Filled 2019-05-16 (×5): qty 3

## 2019-05-16 NOTE — Progress Notes (Signed)
PT Cancellation Note  Patient Details Name: Jordan Kline MRN: [id number] DOB: [birth year]   Cancelled Treatment:    Reason Eval/Treat Not Completed: PT screened, no needs identified, will sign off. (Discussed case with PMT who report good confidence in home services being able to support any mobility needs, either through equipment or services. As pt is at baseline for level of independence with ADL and mobility needs can be met, PT will sign off at this time. Author defers all home setup needs to PMT. )  10:46 AM, 05/16/19 [name], PT, DPT Physical Therapist - [facility]  [phone] ([place])    [name] 05/16/2019, 10:43 AM

## 2019-05-16 NOTE — Plan of Care (Signed)
Continuing with plan of care. 

## 2019-05-16 NOTE — Progress Notes (Signed)
Progress Note    Jordan Kline  VPX:[id number] DOB: 05/04/1927  DOA: 05/10/2019 PCP: Sherron Monday, MD      Brief Narrative:    Medical records reviewed and are as summarized below:  Jordan Kline  is a 86 y.o. African-American male with a known history of hypertension, dyslipidemia, CVA, chronic urinary retention with indwelling Foley catheter, atrial fibrillation on Eliquis, who presented to the emergency room with acute onset of altered mental status.  Per his wife he had his indwelling Foley catheter replaced a few days prior to admission and was noted to be altered more than usual (for about 1 day prior to admission).  He had similar presentation with previous UTIs.  No reported nausea or vomiting or abdominal pain.  He was noted to have melena in the ER.   Upon presentation to the emergency room, vital signs revealed blood pressure of 78/53 with a pulse of 117 and respirate 33 with pulse oximetry 100% on room air attention 98.7.  Labs revealed BUN of 122 with a creatinine of 4.44 compared to 66 and 1.58, lactic acid of 2 and CBC with leukocytosis 12.3 with neutrophilia as well as anemia with hemoglobin of 7 hematocrit 23.4 compared to 10 and 29.2 in January 2019.  INR is 1.9 with PTT 21.4.  Urinalysis was strongly positive for UTI.  The patient had grossly melanotic stool upon rectal exam by ER physician with positive stool Hemoccult.     Assessment/Plan:   Active Problems:   GIB (gastrointestinal bleeding)   Sepsis due to gram-negative UTI (HCC)   Sepsis secondary to UTI (HCC)   Pressure injury of skin   AKI (acute kidney injury) (HCC)   Atrial fibrillation with RVR (HCC)   Chronic CHF (HCC)   Hypernatremia   Altered mental status   Palliative care by specialist   E. coli sepsis and bacteremia secondary to UTI/ leukocytosis: Continue IV Ancef.  No significant improvement in WBC.  Continue to monitor.  GI bleeding with acute blood loss anemia: Patient  and family have decided against any endoscopic work-up.  GI has signed off.  H&H stable thus far.  Monitor H&H and transfuse as needed.  Hypernatremia: Sodium level slowly improving.  Continue low rate IV fluids.  He appears to be tolerating fluids so far.  PVCs/nonsustained V. tach on telemetry: 2D echo showed normal EF at 50 to 55%.  Continue carvedilol  Chronic CHF with preserved ejection fraction: 2D echo showed EF estimated at 50 to 55%, indeterminate LV diastolic parameters, severe pulmonary hypertension, severely dilated left and right atria, moderate to severe MR, moderate to severe TR.  Entresto on hold.  Paroxysmal atrial fibrillation: Eliquis on hold because of GI bleed.  Continue Cardizem and carvedilol.  Uncontrolled hypertension: Overall, BP is better.  Continue carvedilol and Cardizem.  AKI on CKD stage IIIb: Slowly improving.  Continue IV fluids.  BUN and creatinine were 66 and 1.48 respectively on 04/19/2017.  Monitor BMP  Toxic metabolic encephalopathy: Improved  Chronic urinary retention: Patient has Foley catheter in place.  Decubitus ulcer on bilateral buttocks: This was present on admission.  Patient has pinkish area on bilateral buttocks and it's difficult to determine whether this is healed wound or active wound.  Probable stage II.  Mepilex border in place.   Pressure Injury 05/11/19 Buttocks Medial;Mid Stage 1 -  Intact skin with non-blanchable redness of a localized area usually over a bony prominence. non-blenchable (Active)  05/11/19 1100  Location:  Buttocks  Location Orientation: Medial;Mid  Staging: Stage 1 -  Intact skin with non-blanchable redness of a localized area usually over a bony prominence.  Wound Description (Comments): non-blenchable  Present on Admission: Yes         Body mass index is 19.74 kg/m.   Family Communication/Anticipated D/C date and plan/Code Status   DVT prophylaxis: SCDs Code Status: DNR  family Communication: Plan  discussed with patient Disposition Plan: Patient is from home and plan is to discharge patient back home once medically stable for discharge.  Patient is still hypernatremic and creatinine is not back to baseline.  IV fluids has to be given slowly and cautiously to avoid fluid overload.  Possible discharge in 2 to 3 days      Subjective:   He feels okay.  He has no complaints.  No cough, shortness of breath or chest pain.  Objective:    Vitals:   05/15/19 2034 05/16/19 0402 05/16/19 0738 05/16/19 1211  BP: (!) 154/90 138/87  (!) 151/96  Pulse: 75 83  (!) 42  Resp: (!) 24 20  20   Temp: 98.4 F (36.9 C) 97.6 F (36.4 C)  97.9 F (36.6 C)  TempSrc: Oral Oral  Oral  SpO2: 98% 97% 99% 99%  Weight:      Height:        Intake/Output Summary (Last 24 hours) at 05/16/2019 1320 Last data filed at 05/16/2019 1217 Gross per 24 hour  Intake 1672.02 ml  Output 2700 ml  Net -1027.98 ml   Filed Weights   05/10/19 2130 05/11/19 0709  Weight: 59 kg 58.9 kg    Exam:  GEN: NAD, he is comfortable at rest SKIN: pinkish area on bilateral buttocks  EYES: No pallor or icterus ENT: MMM CV: RRR PULM: He still has occasional bilateral expiratory wheezing but no rales heard ABD: soft, ND, NT, +BS CNS: AAO x 3, 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 EXT: No edema or tenderness GU: Bilateral inguinal hernia  Data Reviewed:   I have personally reviewed following labs and imaging studies:  Labs: Labs show the following:   Basic Metabolic Panel: Recent Labs  Lab 05/13/19 0605 05/13/19 0605 05/13/19 1418 05/13/19 1418 05/14/19 0633 05/14/19 0633 05/15/19 0530 05/16/19 0244  NA 152*  --  151*  --  155*  --  154* 153*  K 4.1   < > 4.2   < > 3.9   < > 4.2 3.9  CL 128*  --  128*  --  128*  --  127* 125*  CO2 17*  --  19*  --  19*  --  22 21*  GLUCOSE 130*  --  177*  --  155*  --  137* 123*  BUN 109*  --  101*  --  105*  --  111* 95*  CREATININE 3.36*   --  3.33*  --  3.24*  --  2.90* 2.44*  CALCIUM 7.6*  --  7.9*  --  8.4*  --  8.4* 8.4*  MG  --   --   --   --   --   --  2.4  --    < > = values in this interval not displayed.   GFR Estimated Creatinine Clearance: 16.8 mL/min (A) (by C-G formula based on SCr of 2.44 mg/dL (H)). Liver Function Tests: Recent Labs  Lab 05/10/19 2136 05/11/19 0615  AST 24 35  ALT 22 28  ALKPHOS 133* 81  BILITOT 0.8 1.0  PROT 5.6* 5.6*  ALBUMIN 2.6* 2.5*   No results for input(s): LIPASE, AMYLASE in the last 168 hours. No results for input(s): AMMONIA in the last 168 hours. Coagulation profile Recent Labs  Lab 05/10/19 2136 05/11/19 0615  INR 1.9* 2.2*    CBC: Recent Labs  Lab 05/12/19 0428 05/13/19 0605 05/14/19 0633 05/15/19 0530 05/16/19 0244  WBC 18.5* 19.5* 11.9* 19.7* 18.3*  NEUTROABS 15.8* 17.1* 11.0* 17.5* 16.9*  HGB 10.2* 9.8* 10.3* 10.2* 10.5*  HCT 31.4* 29.5* 32.2* 31.4* 32.3*  MCV 96.9 95.5 97.3 96.3 95.6  PLT 80* 84* 83* 92* 85*   Cardiac Enzymes: No results for input(s): CKTOTAL, CKMB, CKMBINDEX, TROPONINI in the last 168 hours. BNP (last 3 results) No results for input(s): PROBNP in the last 8760 hours. CBG: No results for input(s): GLUCAP in the last 168 hours. D-Dimer: No results for input(s): DDIMER in the last 72 hours. Hgb A1c: No results for input(s): HGBA1C in the last 72 hours. Lipid Profile: No results for input(s): CHOL, HDL, LDLCALC, TRIG, CHOLHDL, LDLDIRECT in the last 72 hours. Thyroid function studies: No results for input(s): TSH, T4TOTAL, T3FREE, THYROIDAB in the last 72 hours.  Invalid input(s): FREET3 Anemia work up: No results for input(s): VITAMINB12, FOLATE, FERRITIN, TIBC, IRON, RETICCTPCT in the last 72 hours. Sepsis Labs: Recent Labs  Lab 05/10/19 2136 05/10/19 2136 05/10/19 2321 05/11/19 0615 05/11/19 1348 05/11/19 1736 05/12/19 0428 05/13/19 0605 05/14/19 0633 05/15/19 0530 05/16/19 0244  PROCALCITON  --   --   --   >150.00  --   --   --   --   --   --   --   WBC 12.3*   < >  --  21.5*  --   --    < > 19.5* 11.9* 19.7* 18.3*  LATICACIDVEN 2.0*  --  2.9*  --  1.6 1.0  --   --   --   --   --    < > = values in this interval not displayed.    Microbiology Recent Results (from the past 240 hour(s))  Culture, blood (Routine x 2)     Status: Abnormal   Collection Time: 05/10/19  9:35 PM   Specimen: BLOOD  Result Value Ref Range Status   Specimen Description   Final    BLOOD RIGHT FOREARM Performed at [facility], [address] [place], [place] [address]    Special Requests   Final    BOTTLES DRAWN AEROBIC AND ANAEROBIC Blood Culture adequate volume Performed at [facility], [address] [place], [place] [address]    Culture  Setup Time   Final    GRAM NEGATIVE RODS IN BOTH AEROBIC AND ANAEROBIC BOTTLES CRITICAL RESULT CALLED TO, READ BACK BY AND VERIFIED WITH:  JODY BAREFOOT ON 05/11/2019 AT 0939 TIK    Culture (A)  Final    ESCHERICHIA COLI SUSCEPTIBILITIES PERFORMED ON PREVIOUS CULTURE WITHIN THE LAST 5 DAYS. Performed at [facility], [address] [place], [place] [address]    Report Status 05/13/2019 FINAL  Final  Blood Culture ID Panel (Reflexed)     Status: Abnormal   Collection Time: 05/10/19  9:35 PM  Result Value Ref Range Status   Enterococcus species NOT DETECTED NOT DETECTED Final   Listeria monocytogenes NOT DETECTED NOT DETECTED Final   Staphylococcus species NOT DETECTED NOT DETECTED Final   Staphylococcus aureus (BCID) NOT DETECTED NOT DETECTED Final  Streptococcus species NOT DETECTED NOT DETECTED Final   Streptococcus agalactiae NOT DETECTED NOT DETECTED Final   Streptococcus pneumoniae NOT DETECTED NOT DETECTED Final   Streptococcus pyogenes NOT DETECTED NOT DETECTED Final   Acinetobacter baumannii NOT DETECTED NOT DETECTED Final   Enterobacteriaceae species DETECTED (A) NOT DETECTED Final    Comment: Enterobacteriaceae represent a  large family of gram-negative bacteria, not a single organism. CRITICAL RESULT CALLED TO, READ BACK BY AND VERIFIED WITH:  JODY BAREFOOT ON 05/11/2019 AT 0939 TIK    Enterobacter cloacae complex NOT DETECTED NOT DETECTED Final   Escherichia coli DETECTED (A) NOT DETECTED Final    Comment: CRITICAL RESULT CALLED TO, READ BACK BY AND VERIFIED WITH: JODY BAREFOOT ON 05/11/2019 AT 0939 TIK    Klebsiella oxytoca NOT DETECTED NOT DETECTED Final   Klebsiella pneumoniae NOT DETECTED NOT DETECTED Final   Proteus species NOT DETECTED NOT DETECTED Final   Serratia marcescens NOT DETECTED NOT DETECTED Final   Carbapenem resistance NOT DETECTED NOT DETECTED Final   Haemophilus influenzae NOT DETECTED NOT DETECTED Final   Neisseria meningitidis NOT DETECTED NOT DETECTED Final   Pseudomonas aeruginosa NOT DETECTED NOT DETECTED Final   Candida albicans NOT DETECTED NOT DETECTED Final   Candida glabrata NOT DETECTED NOT DETECTED Final   Candida krusei NOT DETECTED NOT DETECTED Final   Candida parapsilosis NOT DETECTED NOT DETECTED Final   Candida tropicalis NOT DETECTED NOT DETECTED Final    Comment: Performed at [facility], [address] [place], [place] [address]  Culture, blood (Routine x 2)     Status: Abnormal   Collection Time: 05/10/19  9:36 PM   Specimen: BLOOD  Result Value Ref Range Status   Specimen Description   Final    BLOOD RIGHT ANTECUBITAL Performed at [facility], [address] [place], [place] [address]    Special Requests   Final    BOTTLES DRAWN AEROBIC AND ANAEROBIC Blood Culture adequate volume Performed at [facility], [address] [place], [place] [address]    Culture  Setup Time   Final    GRAM NEGATIVE RODS IN BOTH AEROBIC AND ANAEROBIC BOTTLES CRITICAL RESULT CALLED TO, READ BACK BY AND VERIFIED WITH: JODY BAREFOOT ON 05/11/2019 AT 0939 TIK Performed at [facility], [address] [place], [place] [address]     Culture ESCHERICHIA COLI (A)  Final   Report Status 05/13/2019 FINAL  Final   Organism ID, Bacteria ESCHERICHIA COLI  Final      Susceptibility   Escherichia coli - MIC*    AMPICILLIN <=2 SENSITIVE Sensitive     CEFAZOLIN <=4 SENSITIVE Sensitive     CEFEPIME <=0.12 SENSITIVE Sensitive     CEFTAZIDIME <=1 SENSITIVE Sensitive     CEFTRIAXONE <=0.25 SENSITIVE Sensitive     CIPROFLOXACIN <=0.25 SENSITIVE Sensitive     GENTAMICIN <=1 SENSITIVE Sensitive     IMIPENEM <=0.25 SENSITIVE Sensitive     TRIMETH/SULFA <=20 SENSITIVE Sensitive     AMPICILLIN/SULBACTAM <=2 SENSITIVE Sensitive     PIP/TAZO <=4 SENSITIVE Sensitive     * ESCHERICHIA COLI  Respiratory Panel by RT PCR (Flu A&B, Covid) - Nasopharyngeal Swab     Status: None   Collection Time: 05/10/19  9:36 PM   Specimen: Nasopharyngeal Swab  Result Value Ref Range Status   SARS Coronavirus 2 by RT PCR NEGATIVE NEGATIVE Final    Comment: (NOTE) SARS-CoV-2 target nucleic acids are NOT DETECTED. The SARS-CoV-2 RNA is generally detectable in  upper respiratoy specimens during the acute phase of infection. The lowest concentration of SARS-CoV-2 viral copies this assay can detect is 131 copies/mL. A negative result does not preclude SARS-Cov-2 infection and should not be used as the sole basis for treatment or other patient management decisions. A negative result may occur with  improper specimen collection/handling, submission of specimen other than nasopharyngeal swab, presence of viral mutation(s) within the areas targeted by this assay, and inadequate number of viral copies (<131 copies/mL). A negative result must be combined with clinical observations, patient history, and epidemiological information. The expected result is Negative. Fact Sheet for Patients:  https://www.moore.com/https://www.fda.gov/media/142436/download Fact Sheet for Healthcare Providers:  https://www.young.biz/https://www.fda.gov/media/142435/download This test is not yet ap proved or cleared by the  [place] FDA and  has been authorized for detection and/or diagnosis of SARS-CoV-2 by FDA under an Emergency Use Authorization (EUA). This EUA will remain  in effect (meaning this test can be used) for the duration of the COVID-19 declaration under Section 564(b)(1) of the Act, 21 U.S.C. section 360bbb-3(b)(1), unless the authorization is terminated or revoked sooner.    Influenza A by PCR NEGATIVE NEGATIVE Final   Influenza B by PCR NEGATIVE NEGATIVE Final    Comment: (NOTE) The Xpert Xpress SARS-CoV-2/FLU/RSV assay is intended as an aid in  the diagnosis of influenza from Nasopharyngeal swab specimens and  should not be used as a sole basis for treatment. Nasal washings and  aspirates are unacceptable for Xpert Xpress SARS-CoV-2/FLU/RSV  testing. Fact Sheet for Patients: https://www.moore.com/https://www.fda.gov/media/142436/download Fact Sheet for Healthcare Providers: https://www.young.biz/https://www.fda.gov/media/142435/download This test is not yet approved or cleared by the [place] FDA and  has been authorized for detection and/or diagnosis of SARS-CoV-2 by  FDA under an Emergency Use Authorization (EUA). This EUA will remain  in effect (meaning this test can be used) for the duration of the  Covid-19 declaration under Section 564(b)(1) of the Act, 21  U.S.C. section 360bbb-3(b)(1), unless the authorization is  terminated or revoked. Performed at [facility], [address] [place], [place] [address]   Urine Culture     Status: Abnormal   Collection Time: 05/10/19  9:36 PM   Specimen: Urine, Random  Result Value Ref Range Status   Specimen Description   Final    URINE, RANDOM Performed at [facility], [address] [place], [place] [address]    Special Requests   Final    NONE Performed at [facility], [address] [place], [place] [address]    Culture >=100,000 COLONIES/mL ESCHERICHIA COLI (A)  Final   Report Status 05/13/2019 FINAL  Final   Organism ID,  Bacteria ESCHERICHIA COLI (A)  Final      Susceptibility   Escherichia coli - MIC*    AMPICILLIN <=2 SENSITIVE Sensitive     CEFAZOLIN <=4 SENSITIVE Sensitive     CEFTRIAXONE <=0.25 SENSITIVE Sensitive     CIPROFLOXACIN <=0.25 SENSITIVE Sensitive     GENTAMICIN <=1 SENSITIVE Sensitive     IMIPENEM <=0.25 SENSITIVE Sensitive     NITROFURANTOIN <=16 SENSITIVE Sensitive     TRIMETH/SULFA <=20 SENSITIVE Sensitive     AMPICILLIN/SULBACTAM <=2 SENSITIVE Sensitive     PIP/TAZO <=4 SENSITIVE Sensitive     * >=100,000 COLONIES/mL ESCHERICHIA COLI    Procedures and diagnostic studies:  DG Chest Port 1 View  Result Date: 05/15/2019 CLINICAL DATA:  CHF EXAM: PORTABLE CHEST 1 VIEW COMPARISON:  05/13/2019 FINDINGS: Stable mild cardiomegaly. Calcific aortic knob. Mild pulmonary vascular congestion. No  new focal airspace consolidation. No pleural effusion or pneumothorax. IMPRESSION: Similar appearance of mild pulmonary vascular congestion. Electronically Signed   By: Duanne Guess D.O.   On: 05/15/2019 12:24   ECHOCARDIOGRAM COMPLETE  Result Date: 05/15/2019    ECHOCARDIOGRAM REPORT   Patient Name:   Jordan Kline Date of Exam: 05/15/2019 Medical Rec #:  [id number]        Height:       68.0 in Accession #:    [id number]       Weight:       129.9 lb Date of Birth:  1927-09-26        BSA:          1.70 m Patient Age:    89 years         BP:           147/95 mmHg Patient Gender: M                HR:           76 bpm. Exam Location:  [facility] Procedure: 2D Echo, Cardiac Doppler and Color Doppler Indications:     CHF 428.0 Ventricular tachycardia 147.2  History:         Patient has no prior history of Echocardiogram examinations.                  Stroke, Arrythmias:Atrial Fibrillation; Risk                  Factors:Hypertension.  Sonographer:     Cristela Blue RDCS (AE) Referring Phys:  [id number] Lurene Shadow Diagnosing Phys: Julien Nordmann MD IMPRESSIONS  1. Left ventricular ejection fraction, by estimation, is 50  to 55%. The left ventricle has low normal function. The left ventricle has no regional wall motion abnormalities. Left ventricular diastolic parameters are indeterminate.  2. Right ventricular systolic function is normal. The right ventricular size is mildly enlarged. There is severely elevated pulmonary artery systolic pressure.  3. Left atrial size was severely dilated.  4. Right atrial size was severely dilated.  5. Moderate to severe mitral valve regurgitation.  6. Tricuspid valve regurgitation is moderate to severe.  7. The inferior vena cava is dilated in size with <50% respiratory variability, suggesting right atrial pressure of 15 mmHg. FINDINGS  Left Ventricle: Left ventricular ejection fraction, by estimation, is 50 to 55%. The left ventricle has low normal function. The left ventricle has no regional wall motion abnormalities. The left ventricular internal cavity size was normal in size. There is no left ventricular hypertrophy. Left ventricular diastolic parameters are indeterminate. Right Ventricle: The right ventricular size is mildly enlarged. No increase in right ventricular wall thickness. Right ventricular systolic function is normal. There is severely elevated pulmonary artery systolic pressure. The tricuspid regurgitant velocity is 4.15 m/s, and with an assumed right atrial pressure of 15 mmHg, the estimated right ventricular systolic pressure is 83.9 mmHg. Left Atrium: Left atrial size was severely dilated. Right Atrium: Right atrial size was severely dilated. Pericardium: There is no evidence of pericardial effusion. Mitral Valve: The mitral valve is normal in structure and function. Normal mobility of the mitral valve leaflets. Moderate to severe mitral valve regurgitation. No evidence of mitral valve stenosis. Tricuspid Valve: The tricuspid valve is normal in structure. Tricuspid valve regurgitation is moderate to severe. No evidence of tricuspid stenosis. Aortic Valve: The aortic valve is  normal in structure and function. Aortic valve regurgitation is not visualized. Mild to moderate aortic valve  sclerosis/calcification is present, without any evidence of aortic stenosis. Aortic valve mean gradient measures 3.2 mmHg. Aortic valve peak gradient measures 5.1 mmHg. Aortic valve area, by VTI measures 1.81 cm. Pulmonic Valve: The pulmonic valve was normal in structure. Pulmonic valve regurgitation is not visualized. No evidence of pulmonic stenosis. Aorta: The aortic root is normal in size and structure. Venous: The inferior vena cava is dilated in size with less than 50% respiratory variability, suggesting right atrial pressure of 15 mmHg. IAS/Shunts: No atrial level shunt detected by color flow Doppler.  LEFT VENTRICLE PLAX 2D LVIDd:         4.67 cm LVIDs:         3.21 cm LV PW:         1.18 cm LV IVS:        0.85 cm LVOT diam:     2.10 cm LV SV:         39.83 ml LV SV Index:   35.58 LVOT Area:     3.46 cm  RIGHT VENTRICLE RV Basal diam:  3.01 cm RV S prime:     12.30 cm/s TAPSE (M-mode): 3.4 cm LEFT ATRIUM             Index       RIGHT ATRIUM           Index LA diam:        5.40 cm 3.17 cm/m  RA Area:     25.60 cm LA Vol (A2C):   55.9 ml 32.86 ml/m RA Volume:   78.40 ml  46.09 ml/m LA Vol (A4C):   76.2 ml 44.80 ml/m LA Biplane Vol: 70.6 ml 41.51 ml/m  AORTIC VALVE                   PULMONIC VALVE AV Area (Vmax):    1.79 cm    PV Vmax:        0.64 m/s AV Area (Vmean):   1.85 cm    PV Peak grad:   1.6 mmHg AV Area (VTI):     1.81 cm    RVOT Peak grad: 4 mmHg AV Vmax:           113.25 cm/s AV Vmean:          78.450 cm/s AV VTI:            0.220 m AV Peak Grad:      5.1 mmHg AV Mean Grad:      3.2 mmHg LVOT Vmax:         58.40 cm/s LVOT Vmean:        41.800 cm/s LVOT VTI:          0.115 m LVOT/AV VTI ratio: 0.52  AORTA Ao Root diam: 3.20 cm MITRAL VALVE                TRICUSPID VALVE MV Area (PHT): 4.36 cm     TR Peak grad:   68.9 mmHg MV Decel Time: 174 msec     TR Vmax:        415.00 cm/s MV  E velocity: 107.00 cm/s                             SHUNTS                             Systemic VTI:  0.12 m  Systemic Diam: 2.10 cm Julien Nordmann MD Electronically signed by Julien Nordmann MD Signature Date/Time: 05/15/2019/11:22:46 AM    Final     Medications:   . acidophilus  1 capsule Oral BID  . atorvastatin  80 mg Oral QPM  . carvedilol  12.5 mg Oral BID  . Chlorhexidine Gluconate Cloth  6 each Topical Daily  . diltiazem  180 mg Oral QPM  . feeding supplement (NEPRO CARB STEADY)  237 mL Oral TID BM  . ipratropium-albuterol  3 mL Nebulization Q6H  . multivitamin with minerals  1 tablet Oral Daily   Continuous Infusions: .  ceFAZolin (ANCEF) IV Stopped (05/16/19 0502)  . dextrose 50 mL/hr at 05/16/19 0932     LOS: 6 days   Jordan Kline  [facility]     05/16/2019, 1:20 PM

## 2019-05-17 LAB — CBC WITH DIFFERENTIAL/PLATELET
Abs Immature Granulocytes: 0.21 10*3/uL — ABNORMAL HIGH (ref 0.00–0.07)
Basophils Absolute: 0 10*3/uL (ref 0.0–0.1)
Basophils Relative: 0 %
Eosinophils Absolute: 0.2 10*3/uL (ref 0.0–0.5)
Eosinophils Relative: 1 %
HCT: 35.2 % — ABNORMAL LOW (ref 39.0–52.0)
Hemoglobin: 11.2 g/dL — ABNORMAL LOW (ref 13.0–17.0)
Immature Granulocytes: 1 %
Lymphocytes Relative: 4 %
Lymphs Abs: 0.6 10*3/uL — ABNORMAL LOW (ref 0.7–4.0)
MCH: 30.2 pg (ref 26.0–34.0)
MCHC: 31.8 g/dL (ref 30.0–36.0)
MCV: 94.9 fL (ref 80.0–100.0)
Monocytes Absolute: 0.6 10*3/uL (ref 0.1–1.0)
Monocytes Relative: 4 %
Neutro Abs: 14.3 10*3/uL — ABNORMAL HIGH (ref 1.7–7.7)
Neutrophils Relative %: 90 %
Platelets: 88 10*3/uL — ABNORMAL LOW (ref 150–400)
RBC: 3.71 MIL/uL — ABNORMAL LOW (ref 4.22–5.81)
RDW: 17.5 % — ABNORMAL HIGH (ref 11.5–15.5)
WBC: 15.9 10*3/uL — ABNORMAL HIGH (ref 4.0–10.5)
nRBC: 0.1 % (ref 0.0–0.2)

## 2019-05-17 LAB — BASIC METABOLIC PANEL
Anion gap: 11 (ref 5–15)
BUN: 80 mg/dL — ABNORMAL HIGH (ref 8–23)
CO2: 22 mmol/L (ref 22–32)
Calcium: 8.6 mg/dL — ABNORMAL LOW (ref 8.9–10.3)
Chloride: 120 mmol/L — ABNORMAL HIGH (ref 98–111)
Creatinine, Ser: 1.92 mg/dL — ABNORMAL HIGH (ref 0.61–1.24)
GFR calc Af Amer: 35 mL/min — ABNORMAL LOW (ref >60)
GFR calc non Af Amer: 30 mL/min — ABNORMAL LOW (ref >60)
Glucose, Bld: 115 mg/dL — ABNORMAL HIGH (ref 70–99)
Potassium: 3.6 mmol/L (ref 3.5–5.1)
Sodium: 153 mmol/L — ABNORMAL HIGH (ref 135–145)

## 2019-05-17 MED ORDER — AMOXICILLIN 500 MG PO CAPS
500.0000 mg | ORAL_CAPSULE | Freq: Two times a day (BID) | ORAL | Status: DC
  Administered 2019-05-17 – 2019-05-19 (×5): 500 mg via ORAL
  Filled 2019-05-17 (×6): qty 1

## 2019-05-17 NOTE — Plan of Care (Signed)
Continuing with plan of care. 

## 2019-05-17 NOTE — Progress Notes (Signed)
  Speech Language Pathology Treatment: Dysphagia  Patient Details Name: Jordan Kline MRN: [id number] DOB: June 19, 1927 Today's Date: 05/17/2019 Time: 0962-8366 SLP Time Calculation (min) (ACUTE ONLY): 17 min  Assessment / Plan / Recommendation Clinical Impression  Skilled ST services focused on clinical observations and education with patient. Upon entry, patient reclined in bed with eyes open. Patient stated no difficult with current puree/thin liquid diet. When SLP offered to assess patient with different food textures/consistencies, patient politely declined and stated preference for softer foods. SLP assisted with oral care with toothbrush and toothpaste. Given assistance, patient consumed ~2oz of thin liquids via straw demonstrating min anterior oral spillage on L side of oral cavity (suspected decreased oral containment d/t facial asymmetry on L side and decreased labial seal). No coughing, choking, changes in respiratory statues, and/or vocal quality changes with straw sips of thin liquids.  At this time, SLP recommends patient to continue current puree/thin liquid diet while following all recommend aspiration precautions. Education provided to patient re: above recommendations - He verbalized understanding and agreement. Evaluating SLP to upgrade diet as patient prefers in future sessions and consider MBSS in s/s of pharyngeal dysphagia occur. Patient was left with call bell in reach and all needs met. Continue POC.         SLP Plan  Continue with current plan of care       Recommendations  Diet recommendations: Dysphagia 1 (puree);Thin liquid Liquids provided via: Straw Medication Administration: Other (Comment)(Per MD/RN preference) Supervision: Staff to assist with self feeding Compensations: Minimize environmental distractions;Slow rate;Small sips/bites;Monitor for anterior loss                Oral Care Recommendations: Oral care before and after PO Follow up  Recommendations: Other (comment)(TBD) SLP Visit Diagnosis: Dysphagia, oral phase (R13.11) Plan: Continue with current plan of care       [place], M.S. CCC-SLP Speech-Language Pathologist  Jordan Kline 05/17/2019, 11:54 AM

## 2019-05-17 NOTE — Progress Notes (Addendum)
Progress Note    Jordan Kline  ZOX:[id number] DOB: [birth year]  DOA: 05/10/2019 PCP: [name], S Ahmed, MD      Brief Narrative:    Medical records reviewed and are as summarized below:  Jordan Kline  is a 86 y.o. African-American male with a known history of hypertension, dyslipidemia, CVA, chronic urinary retention with indwelling Foley catheter, atrial fibrillation on Eliquis, who presented to the emergency room with acute onset of altered mental status.  Per his wife he had his indwelling Foley catheter replaced a few days prior to admission and was noted to be altered more than usual (for about 1 day prior to admission).  He had similar presentation with previous UTIs.  No reported nausea or vomiting or abdominal pain.  He was noted to have melena in the ER.   Upon presentation to the emergency room, vital signs revealed blood pressure of 78/53 with a pulse of 117 and respirate 33 with pulse oximetry 100% on room air attention 98.7.  Labs revealed BUN of 122 with a creatinine of 4.44 compared to 66 and 1.58, lactic acid of 2 and CBC with leukocytosis 12.3 with neutrophilia as well as anemia with hemoglobin of 7 hematocrit 23.4 compared to 10 and 29.2 in January 2019.  INR is 1.9 with PTT 21.4.  Urinalysis was strongly positive for UTI.  The patient had grossly melanotic stool upon rectal exam by ER physician with positive stool Hemoccult.     Assessment/Plan:   Active Problems:   GIB (gastrointestinal bleeding)   Sepsis due to gram-negative UTI (HCC)   Sepsis secondary to UTI (HCC)   Pressure injury of skin   AKI (acute kidney injury) (HCC)   Atrial fibrillation with RVR (HCC)   Chronic CHF (HCC)   Hypernatremia   Altered mental status   Palliative care by specialist   E. coli sepsis and bacteremia secondary to UTI/ leukocytosis: Change IV Ancef to oral amoxicillin (to be completed on May 20, 2019).  Leukocytosis is improving.  Follow-up CBC.    GI  bleeding with acute blood loss anemia: Patient and family have decided against any endoscopic work-up.  GI has signed off.  H&H stable thus far.  Monitor H&H and transfuse as needed.  Hypernatremia: No change in sodium level overnight.  Continue low rate IV fluids.  He appears to be tolerating fluids so far.  Intermittent PVCs/nonsustained V. tach on telemetry: 2D echo showed normal EF at 50 to 55%.  Continue carvedilol  Chronic CHF with preserved ejection fraction: 2D echo showed EF estimated at 50 to 55%, indeterminate LV diastolic parameters, severe pulmonary hypertension, severely dilated left and right atria, moderate to severe MR, moderate to severe TR.  Entresto on hold.  Paroxysmal atrial fibrillation: Eliquis on hold because of GI bleed.  Continue Cardizem and carvedilol.  Uncontrolled hypertension: Overall, BP is better.  Continue carvedilol and Cardizem.  AKI on CKD stage IIIb: Slowly improving.  Continue IV fluids.  BUN and creatinine were 66 and 1.48 respectively on 04/19/2017.  Monitor BMP  Toxic metabolic encephalopathy: Improved  Chronic urinary retention: Patient has Foley catheter in place.  Decubitus ulcer on bilateral buttocks: This was present on admission.  Patient has pinkish area on bilateral buttocks and it's difficult to determine whether this is healed wound or active wound.  Probable stage II.  Mepilex border in place.   Pressure Injury 05/11/19 Buttocks Medial;Mid Stage 1 -  Intact skin with non-blanchable redness of a localized area  usually over a bony prominence. non-blenchable (Active)  05/11/19 1100  Location: Buttocks  Location Orientation: Medial;Mid  Staging: Stage 1 -  Intact skin with non-blanchable redness of a localized area usually over a bony prominence.  Wound Description (Comments): non-blenchable  Present on Admission: Yes         Body mass index is 19.74 kg/m.   Family Communication/Anticipated D/C date and plan/Code Status   DVT  prophylaxis: SCDs Code Status: DNR  family Communication: Plan discussed with patient Disposition Plan: Patient is from home and plan is to discharge patient back home once medically stable for discharge.  Patient is still hypernatremic and creatinine is not back to baseline.  IV fluids has to be given slowly and cautiously to avoid fluid overload.  Possible discharge in 2 to 3 days      Subjective:   No shortness of breath or chest pain.  He has no complaints.  Objective:    Vitals:   05/16/19 2012 05/16/19 2054 05/17/19 0453 05/17/19 0734  BP:  (!) 155/99 (!) 154/100   Pulse:  97 82   Resp:  20 20   Temp:  (!) 97.5 F (36.4 C) 97.9 F (36.6 C)   TempSrc:  Oral Oral   SpO2: 98% 96% 100% 100%  Weight:      Height:        Intake/Output Summary (Last 24 hours) at 05/17/2019 1151 Last data filed at 05/17/2019 0900 Gross per 24 hour  Intake 980.47 ml  Output 1225 ml  Net -244.53 ml   Filed Weights   05/10/19 2130 05/11/19 0709  Weight: 59 kg 58.9 kg    Exam:  GEN: NAD, he is comfortable at rest SKIN: pinkish area on bilateral buttocks  EYES: No pallor or icterus ENT: MMM CV: Irregular rate and rhythm PULM: No wheezing or rales heard ABD: soft, ND, NT, +BS CNS: AAO x 3, 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 EXT: No edema or tenderness GU: Bilateral inguinal hernia  Data Reviewed:   I have personally reviewed following labs and imaging studies:  Labs: Labs show the following:   Basic Metabolic Panel: Recent Labs  Lab 05/13/19 1418 05/13/19 1418 05/14/19 0633 05/14/19 0633 05/15/19 0530 05/15/19 0530 05/16/19 0244 05/17/19 0651  NA 151*  --  155*  --  154*  --  153* 153*  K 4.2   < > 3.9   < > 4.2   < > 3.9 3.6  CL 128*  --  128*  --  127*  --  125* 120*  CO2 19*  --  19*  --  22  --  21* 22  GLUCOSE 177*  --  155*  --  137*  --  123* 115*  BUN 101*  --  105*  --  111*  --  95* 80*  CREATININE 3.33*  --  3.24*  --   2.90*  --  2.44* 1.92*  CALCIUM 7.9*  --  8.4*  --  8.4*  --  8.4* 8.6*  MG  --   --   --   --  2.4  --   --   --    < > = values in this interval not displayed.   GFR Estimated Creatinine Clearance: 21.3 mL/min (A) (by C-G formula based on SCr of 1.92 mg/dL (H)). Liver Function Tests: Recent Labs  Lab 05/10/19 2136 05/11/19 0615  AST 24 35  ALT 22 28  ALKPHOS 133* 81  BILITOT 0.8 1.0  PROT 5.6* 5.6*  ALBUMIN 2.6* 2.5*   No results for input(s): LIPASE, AMYLASE in the last 168 hours. No results for input(s): AMMONIA in the last 168 hours. Coagulation profile Recent Labs  Lab 05/10/19 2136 05/11/19 0615  INR 1.9* 2.2*    CBC: Recent Labs  Lab 05/13/19 0605 05/14/19 0633 05/15/19 0530 05/16/19 0244 05/17/19 0651  WBC 19.5* 11.9* 19.7* 18.3* 15.9*  NEUTROABS 17.1* 11.0* 17.5* 16.9* 14.3*  HGB 9.8* 10.3* 10.2* 10.5* 11.2*  HCT 29.5* 32.2* 31.4* 32.3* 35.2*  MCV 95.5 97.3 96.3 95.6 94.9  PLT 84* 83* 92* 85* 88*   Cardiac Enzymes: No results for input(s): CKTOTAL, CKMB, CKMBINDEX, TROPONINI in the last 168 hours. BNP (last 3 results) No results for input(s): PROBNP in the last 8760 hours. CBG: No results for input(s): GLUCAP in the last 168 hours. D-Dimer: No results for input(s): DDIMER in the last 72 hours. Hgb A1c: No results for input(s): HGBA1C in the last 72 hours. Lipid Profile: No results for input(s): CHOL, HDL, LDLCALC, TRIG, CHOLHDL, LDLDIRECT in the last 72 hours. Thyroid function studies: No results for input(s): TSH, T4TOTAL, T3FREE, THYROIDAB in the last 72 hours.  Invalid input(s): FREET3 Anemia work up: No results for input(s): VITAMINB12, FOLATE, FERRITIN, TIBC, IRON, RETICCTPCT in the last 72 hours. Sepsis Labs: Recent Labs  Lab 05/10/19 2136 05/10/19 2136 05/10/19 2321 05/11/19 0615 05/11/19 1348 05/11/19 1736 05/12/19 0428 05/14/19 8182 05/15/19 0530 05/16/19 0244 05/17/19 0651  PROCALCITON  --   --   --  >150.00  --   --   --    --   --   --   --   WBC 12.3*   < >  --  21.5*  --   --    < > 11.9* 19.7* 18.3* 15.9*  LATICACIDVEN 2.0*  --  2.9*  --  1.6 1.0  --   --   --   --   --    < > = values in this interval not displayed.    Microbiology Recent Results (from the past 240 hour(s))  Culture, blood (Routine x 2)     Status: Abnormal   Collection Time: 05/10/19  9:35 PM   Specimen: BLOOD  Result Value Ref Range Status   Specimen Description   Final    BLOOD RIGHT FOREARM Performed at [facility], [address], [place], [place] [address]    Special Requests   Final    BOTTLES DRAWN AEROBIC AND ANAEROBIC Blood Culture adequate volume Performed at [facility], [address] [place], [place] [address]    Culture  Setup Time   Final    GRAM NEGATIVE RODS IN BOTH AEROBIC AND ANAEROBIC BOTTLES CRITICAL RESULT CALLED TO, READ BACK BY AND VERIFIED WITH:  JODY BAREFOOT ON 05/11/2019 AT 0939 TIK    Culture (A)  Final    ESCHERICHIA COLI SUSCEPTIBILITIES PERFORMED ON PREVIOUS CULTURE WITHIN THE LAST 5 DAYS. Performed at [facility], [address] [place], [place] [address]    Report Status 05/13/2019 FINAL  Final  Blood Culture ID Panel (Reflexed)     Status: Abnormal   Collection Time: 05/10/19  9:35 PM  Result Value Ref Range Status   Enterococcus species NOT DETECTED NOT DETECTED Final   Listeria monocytogenes NOT DETECTED NOT DETECTED Final   Staphylococcus species NOT DETECTED NOT DETECTED Final   Staphylococcus aureus (BCID) NOT DETECTED NOT DETECTED Final  Streptococcus species NOT DETECTED NOT DETECTED Final   Streptococcus agalactiae NOT DETECTED NOT DETECTED Final   Streptococcus pneumoniae NOT DETECTED NOT DETECTED Final   Streptococcus pyogenes NOT DETECTED NOT DETECTED Final   Acinetobacter baumannii NOT DETECTED NOT DETECTED Final   Enterobacteriaceae species DETECTED (A) NOT DETECTED Final    Comment: Enterobacteriaceae represent a large family of  gram-negative bacteria, not a single organism. CRITICAL RESULT CALLED TO, READ BACK BY AND VERIFIED WITH:  JODY BAREFOOT ON 05/11/2019 AT 0939 TIK    Enterobacter cloacae complex NOT DETECTED NOT DETECTED Final   Escherichia coli DETECTED (A) NOT DETECTED Final    Comment: CRITICAL RESULT CALLED TO, READ BACK BY AND VERIFIED WITH: JODY BAREFOOT ON 05/11/2019 AT 0939 TIK    Klebsiella oxytoca NOT DETECTED NOT DETECTED Final   Klebsiella pneumoniae NOT DETECTED NOT DETECTED Final   Proteus species NOT DETECTED NOT DETECTED Final   Serratia marcescens NOT DETECTED NOT DETECTED Final   Carbapenem resistance NOT DETECTED NOT DETECTED Final   Haemophilus influenzae NOT DETECTED NOT DETECTED Final   Neisseria meningitidis NOT DETECTED NOT DETECTED Final   Pseudomonas aeruginosa NOT DETECTED NOT DETECTED Final   Candida albicans NOT DETECTED NOT DETECTED Final   Candida glabrata NOT DETECTED NOT DETECTED Final   Candida krusei NOT DETECTED NOT DETECTED Final   Candida parapsilosis NOT DETECTED NOT DETECTED Final   Candida tropicalis NOT DETECTED NOT DETECTED Final    Comment: Performed at [facility], [address] [place], [place] [address]  Culture, blood (Routine x 2)     Status: Abnormal   Collection Time: 05/10/19  9:36 PM   Specimen: BLOOD  Result Value Ref Range Status   Specimen Description   Final    BLOOD RIGHT ANTECUBITAL Performed at [facility], [address] [place], [place] [address]    Special Requests   Final    BOTTLES DRAWN AEROBIC AND ANAEROBIC Blood Culture adequate volume Performed at [facility], [address] [place], [place] [address]    Culture  Setup Time   Final    GRAM NEGATIVE RODS IN BOTH AEROBIC AND ANAEROBIC BOTTLES CRITICAL RESULT CALLED TO, READ BACK BY AND VERIFIED WITH: JODY BAREFOOT ON 05/11/2019 AT 0939 TIK Performed at [facility], [address] [place], [place] [address]    Culture  ESCHERICHIA COLI (A)  Final   Report Status 05/13/2019 FINAL  Final   Organism ID, Bacteria ESCHERICHIA COLI  Final      Susceptibility   Escherichia coli - MIC*    AMPICILLIN <=2 SENSITIVE Sensitive     CEFAZOLIN <=4 SENSITIVE Sensitive     CEFEPIME <=0.12 SENSITIVE Sensitive     CEFTAZIDIME <=1 SENSITIVE Sensitive     CEFTRIAXONE <=0.25 SENSITIVE Sensitive     CIPROFLOXACIN <=0.25 SENSITIVE Sensitive     GENTAMICIN <=1 SENSITIVE Sensitive     IMIPENEM <=0.25 SENSITIVE Sensitive     TRIMETH/SULFA <=20 SENSITIVE Sensitive     AMPICILLIN/SULBACTAM <=2 SENSITIVE Sensitive     PIP/TAZO <=4 SENSITIVE Sensitive     * ESCHERICHIA COLI  Respiratory Panel by RT PCR (Flu A&B, Covid) - Nasopharyngeal Swab     Status: None   Collection Time: 05/10/19  9:36 PM   Specimen: Nasopharyngeal Swab  Result Value Ref Range Status   SARS Coronavirus 2 by RT PCR NEGATIVE NEGATIVE Final    Comment: (NOTE) SARS-CoV-2 target nucleic acids are NOT DETECTED. The SARS-CoV-2 RNA is generally detectable in  upper respiratoy specimens during the acute phase of infection. The lowest concentration of SARS-CoV-2 viral copies this assay can detect is 131 copies/mL. A negative result does not preclude SARS-Cov-2 infection and should not be used as the sole basis for treatment or other patient management decisions. A negative result may occur with  improper specimen collection/handling, submission of specimen other than nasopharyngeal swab, presence of viral mutation(s) within the areas targeted by this assay, and inadequate number of viral copies (<131 copies/mL). A negative result must be combined with clinical observations, patient history, and epidemiological information. The expected result is Negative. Fact Sheet for Patients:  https://www.moore.com/ Fact Sheet for Healthcare Providers:  https://www.young.biz/ This test is not yet ap proved or cleared by the [place] FDA and  has been authorized for detection and/or diagnosis of SARS-CoV-2 by FDA under an Emergency Use Authorization (EUA). This EUA will remain  in effect (meaning this test can be used) for the duration of the COVID-19 declaration under Section 564(b)(1) of the Act, 21 U.S.C. section 360bbb-3(b)(1), unless the authorization is terminated or revoked sooner.    Influenza A by PCR NEGATIVE NEGATIVE Final   Influenza B by PCR NEGATIVE NEGATIVE Final    Comment: (NOTE) The Xpert Xpress SARS-CoV-2/FLU/RSV assay is intended as an aid in  the diagnosis of influenza from Nasopharyngeal swab specimens and  should not be used as a sole basis for treatment. Nasal washings and  aspirates are unacceptable for Xpert Xpress SARS-CoV-2/FLU/RSV  testing. Fact Sheet for Patients: https://www.moore.com/ Fact Sheet for Healthcare Providers: https://www.young.biz/ This test is not yet approved or cleared by the [place] FDA and  has been authorized for detection and/or diagnosis of SARS-CoV-2 by  FDA under an Emergency Use Authorization (EUA). This EUA will remain  in effect (meaning this test can be used) for the duration of the  Covid-19 declaration under Section 564(b)(1) of the Act, 21  U.S.C. section 360bbb-3(b)(1), unless the authorization is  terminated or revoked. Performed at [facility], [address] [place], [place] [address]   Urine Culture     Status: Abnormal   Collection Time: 05/10/19  9:36 PM   Specimen: Urine, Random  Result Value Ref Range Status   Specimen Description   Final    URINE, RANDOM Performed at [facility], [address] [place], [place] [address]    Special Requests   Final    NONE Performed at [facility], [address] [place], [place] [address]    Culture >=100,000 COLONIES/mL ESCHERICHIA COLI (A)  Final   Report Status 05/13/2019 FINAL  Final   Organism ID, Bacteria  ESCHERICHIA COLI (A)  Final      Susceptibility   Escherichia coli - MIC*    AMPICILLIN <=2 SENSITIVE Sensitive     CEFAZOLIN <=4 SENSITIVE Sensitive     CEFTRIAXONE <=0.25 SENSITIVE Sensitive     CIPROFLOXACIN <=0.25 SENSITIVE Sensitive     GENTAMICIN <=1 SENSITIVE Sensitive     IMIPENEM <=0.25 SENSITIVE Sensitive     NITROFURANTOIN <=16 SENSITIVE Sensitive     TRIMETH/SULFA <=20 SENSITIVE Sensitive     AMPICILLIN/SULBACTAM <=2 SENSITIVE Sensitive     PIP/TAZO <=4 SENSITIVE Sensitive     * >=100,000 COLONIES/mL ESCHERICHIA COLI    Procedures and diagnostic studies:  DG Chest Port 1 View  Result Date: 05/15/2019 CLINICAL DATA:  CHF EXAM: PORTABLE CHEST 1 VIEW COMPARISON:  05/13/2019 FINDINGS: Stable mild cardiomegaly. Calcific aortic knob. Mild pulmonary vascular congestion. No  new focal airspace consolidation. No pleural effusion or pneumothorax. IMPRESSION: Similar appearance of mild pulmonary vascular congestion. Electronically Signed   By: Duanne Guess D.O.   On: 05/15/2019 12:24    Medications:   . acidophilus  1 capsule Oral BID  . amoxicillin  500 mg Oral Q12H  . atorvastatin  80 mg Oral QPM  . carvedilol  12.5 mg Oral BID  . Chlorhexidine Gluconate Cloth  6 each Topical Daily  . diltiazem  180 mg Oral QPM  . feeding supplement (NEPRO CARB STEADY)  237 mL Oral TID BM  . ipratropium-albuterol  3 mL Nebulization TID  . multivitamin with minerals  1 tablet Oral Daily   Continuous Infusions: . dextrose 50 mL/hr at 05/17/19 0900     LOS: 7 days   Pranika Finks  [facility]     05/17/2019, 11:51 AM

## 2019-05-18 LAB — BASIC METABOLIC PANEL
Anion gap: 4 — ABNORMAL LOW (ref 5–15)
BUN: 71 mg/dL — ABNORMAL HIGH (ref 8–23)
CO2: 26 mmol/L (ref 22–32)
Calcium: 8.2 mg/dL — ABNORMAL LOW (ref 8.9–10.3)
Chloride: 119 mmol/L — ABNORMAL HIGH (ref 98–111)
Creatinine, Ser: 1.75 mg/dL — ABNORMAL HIGH (ref 0.61–1.24)
GFR calc Af Amer: 39 mL/min — ABNORMAL LOW (ref >60)
GFR calc non Af Amer: 34 mL/min — ABNORMAL LOW (ref >60)
Glucose, Bld: 114 mg/dL — ABNORMAL HIGH (ref 70–99)
Potassium: 3.5 mmol/L (ref 3.5–5.1)
Sodium: 149 mmol/L — ABNORMAL HIGH (ref 135–145)

## 2019-05-18 LAB — CBC WITH DIFFERENTIAL/PLATELET
Abs Immature Granulocytes: 0.11 10*3/uL — ABNORMAL HIGH (ref 0.00–0.07)
Basophils Absolute: 0 10*3/uL (ref 0.0–0.1)
Basophils Relative: 0 %
Eosinophils Absolute: 0.4 10*3/uL (ref 0.0–0.5)
Eosinophils Relative: 2 %
HCT: 34.1 % — ABNORMAL LOW (ref 39.0–52.0)
Hemoglobin: 11.1 g/dL — ABNORMAL LOW (ref 13.0–17.0)
Immature Granulocytes: 1 %
Lymphocytes Relative: 4 %
Lymphs Abs: 0.6 10*3/uL — ABNORMAL LOW (ref 0.7–4.0)
MCH: 30.9 pg (ref 26.0–34.0)
MCHC: 32.6 g/dL (ref 30.0–36.0)
MCV: 95 fL (ref 80.0–100.0)
Monocytes Absolute: 0.6 10*3/uL (ref 0.1–1.0)
Monocytes Relative: 4 %
Neutro Abs: 14.3 10*3/uL — ABNORMAL HIGH (ref 1.7–7.7)
Neutrophils Relative %: 89 %
Platelets: 93 10*3/uL — ABNORMAL LOW (ref 150–400)
RBC: 3.59 MIL/uL — ABNORMAL LOW (ref 4.22–5.81)
RDW: 17.3 % — ABNORMAL HIGH (ref 11.5–15.5)
WBC: 16 10*3/uL — ABNORMAL HIGH (ref 4.0–10.5)
nRBC: 0 % (ref 0.0–0.2)

## 2019-05-18 MED ORDER — APIXABAN 2.5 MG PO TABS
2.5000 mg | ORAL_TABLET | Freq: Two times a day (BID) | ORAL | Status: DC
  Administered 2019-05-18 – 2019-05-19 (×2): 2.5 mg via ORAL
  Filled 2019-05-18 (×2): qty 1

## 2019-05-18 MED ORDER — IPRATROPIUM-ALBUTEROL 0.5-2.5 (3) MG/3ML IN SOLN
3.0000 mL | Freq: Two times a day (BID) | RESPIRATORY_TRACT | Status: DC
  Administered 2019-05-18 – 2019-05-19 (×2): 3 mL via RESPIRATORY_TRACT
  Filled 2019-05-18 (×2): qty 3

## 2019-05-18 NOTE — Progress Notes (Addendum)
Progress Note    Jordan Kline  QQP:[id number] DOB: [birth year]  DOA: 05/10/2019 PCP: [name], MD      Brief Narrative:    Medical records reviewed and are as summarized below:  [name]  is a 86 y.o. African-American male with a known history of hypertension, dyslipidemia, CVA, chronic urinary retention with indwelling Foley catheter, atrial fibrillation on Eliquis, who presented to the emergency room with acute onset of altered mental status.  Per his wife he had his indwelling Foley catheter replaced a few days prior to admission and was noted to be altered more than usual (for about 1 day prior to admission).  He had similar presentation with previous UTIs.  No reported nausea or vomiting or abdominal pain.  He was noted to have melena in the ER.   Upon presentation to the emergency room, vital signs revealed blood pressure of 78/53 with a pulse of 117 and respirate 33 with pulse oximetry 100% on room air attention 98.7.  Labs revealed BUN of 122 with a creatinine of 4.44 compared to 66 and 1.58, lactic acid of 2 and CBC with leukocytosis 12.3 with neutrophilia as well as anemia with hemoglobin of 7 hematocrit 23.4 compared to 10 and 29.2 in January 2019.  INR is 1.9 with PTT 21.4.  Urinalysis was strongly positive for UTI.  The patient had grossly melanotic stool upon rectal exam by ER physician with positive stool Hemoccult.     Assessment/Plan:   Active Problems:   GIB (gastrointestinal bleeding)   Sepsis due to gram-negative UTI (HCC)   Sepsis secondary to UTI (HCC)   Pressure injury of skin   AKI (acute kidney injury) ([place])   Atrial fibrillation with RVR (HCC)   Chronic CHF (HCC)   Hypernatremia   Altered mental status   Palliative care by specialist   E. coli sepsis and bacteremia secondary to UTI/ leukocytosis: Continue amoxicillin (to be completed on May 20, 2019).  Leukocytosis is improving.  Follow-up CBC.    GI bleeding with  acute blood loss anemia: Patient and family have decided against any endoscopic work-up.  GI has signed off.  H&H stable thus far.  Monitor H&H and transfuse as needed.  Hypernatremia: Sodium level is slowly improving. Continue low rate IV fluids.  He appears to be tolerating fluids so far.  Intermittent PVCs/nonsustained V. tach on telemetry: 2D echo showed normal EF at 50 to 55%.  Continue carvedilol  Chronic CHF with preserved ejection fraction: 2D echo showed EF estimated at 50 to 55%, indeterminate LV diastolic parameters, severe pulmonary hypertension, severely dilated left and right atria, moderate to severe MR, moderate to severe TR.  Entresto on hold.  Paroxysmal atrial fibrillation: Risks and benefits of continuing to hold Eliquis for recent GI bleed was discussed with his wife.  She instructed me to resume Eliquis and she understands that patient is at increased risk of recurrent GI bleeding.  Continue Cardizem and carvedilol.  Uncontrolled hypertension: Overall, BP is better.  Continue carvedilol and Cardizem.  AKI on CKD stage IIIb: Slowly improving.  Continue IV fluids.  BUN and creatinine were 66 and 1.48 respectively on 04/19/2017.  Monitor BMP  Toxic metabolic encephalopathy: Improved  Chronic urinary retention: Patient has Foley catheter in place.  Decubitus ulcer on bilateral buttocks: This was present on admission.  Patient has pinkish area on bilateral buttocks and it's difficult to determine whether this is healed wound or active wound.  Probable stage II.  Mepilex border in place.   Pressure Injury 05/11/19 Buttocks Medial;Mid Stage 1 -  Intact skin with non-blanchable redness of a localized area usually over a bony prominence. non-blenchable (Active)  05/11/19 1100  Location: Buttocks  Location Orientation: Medial;Mid  Staging: Stage 1 -  Intact skin with non-blanchable redness of a localized area usually over a bony prominence.  Wound Description (Comments):  non-blenchable  Present on Admission: Yes         Body mass index is 19.74 kg/m.   Family Communication/Anticipated D/C date and plan/Code Status   DVT prophylaxis: SCDs Code Status: DNR  family Communication: Plan discussed with patient and his wife Disposition Plan: Patient is from home and plan is to discharge patient back home once medically stable for discharge.  Patient is still hypernatremic and creatinine is not back to baseline.  IV fluids has to be given slowly and cautiously to avoid fluid overload.  Possible discharge in 1 to 2 days      Subjective:   No shortness of breath or chest pain.  He has no complaints.  Objective:    Vitals:   05/17/19 0734 05/17/19 1338 05/17/19 2005 05/18/19 0524  BP:  (!) 167/90 (!) 148/91 (!) 154/96  Pulse:  84 82 (!) 59  Resp:  (!) 22 20 20   Temp:  98.2 F (36.8 C) 98.4 F (36.9 C) 98.3 F (36.8 C)  TempSrc:  Oral Oral Oral  SpO2: 100% 100% 100% 100%  Weight:      Height:        Intake/Output Summary (Last 24 hours) at 05/18/2019 1214 Last data filed at 05/18/2019 1200 Gross per 24 hour  Intake 1706.54 ml  Output 1150 ml  Net 556.54 ml   Filed Weights   05/10/19 2130 05/11/19 0709  Weight: 59 kg 58.9 kg    Exam:  GEN: NAD, he is comfortable at rest SKIN: pinkish area on bilateral buttocks  EYES: No pallor or icterus ENT: MMM CV: Iregular rate and rhythm PULM: Clear to auscultation bilaterally ABD: soft, ND, NT, +BS CNS: AAO x 3, 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 EXT: No edema or tenderness GU: Bilateral inguinal hernia  Data Reviewed:   I have personally reviewed following labs and imaging studies:  Labs: Labs show the following:   Basic Metabolic Panel: Recent Labs  Lab 05/14/19 0633 05/14/19 0633 05/15/19 0530 05/15/19 0530 05/16/19 0244 05/16/19 0244 05/17/19 0651 05/18/19 0401  NA 155*  --  154*  --  153*  --  153* 149*  K 3.9   < > 4.2   < > 3.9   <  > 3.6 3.5  CL 128*  --  127*  --  125*  --  120* 119*  CO2 19*  --  22  --  21*  --  22 26  GLUCOSE 155*  --  137*  --  123*  --  115* 114*  BUN 105*  --  111*  --  95*  --  80* 71*  CREATININE 3.24*  --  2.90*  --  2.44*  --  1.92* 1.75*  CALCIUM 8.4*  --  8.4*  --  8.4*  --  8.6* 8.2*  MG  --   --  2.4  --   --   --   --   --    < > = values in this interval not displayed.   GFR Estimated Creatinine Clearance: 23.4 mL/min (A) (by C-G  formula based on SCr of 1.75 mg/dL (H)). Liver Function Tests: No results for input(s): AST, ALT, ALKPHOS, BILITOT, PROT, ALBUMIN in the last 168 hours. No results for input(s): LIPASE, AMYLASE in the last 168 hours. No results for input(s): AMMONIA in the last 168 hours. Coagulation profile No results for input(s): INR, PROTIME in the last 168 hours.  CBC: Recent Labs  Lab 05/14/19 0633 05/15/19 0530 05/16/19 0244 05/17/19 0651 05/18/19 0401  WBC 11.9* 19.7* 18.3* 15.9* 16.0*  NEUTROABS 11.0* 17.5* 16.9* 14.3* 14.3*  HGB 10.3* 10.2* 10.5* 11.2* 11.1*  HCT 32.2* 31.4* 32.3* 35.2* 34.1*  MCV 97.3 96.3 95.6 94.9 95.0  PLT 83* 92* 85* 88* 93*   Cardiac Enzymes: No results for input(s): CKTOTAL, CKMB, CKMBINDEX, TROPONINI in the last 168 hours. BNP (last 3 results) No results for input(s): PROBNP in the last 8760 hours. CBG: No results for input(s): GLUCAP in the last 168 hours. D-Dimer: No results for input(s): DDIMER in the last 72 hours. Hgb A1c: No results for input(s): HGBA1C in the last 72 hours. Lipid Profile: No results for input(s): CHOL, HDL, LDLCALC, TRIG, CHOLHDL, LDLDIRECT in the last 72 hours. Thyroid function studies: No results for input(s): TSH, T4TOTAL, T3FREE, THYROIDAB in the last 72 hours.  Invalid input(s): FREET3 Anemia work up: No results for input(s): VITAMINB12, FOLATE, FERRITIN, TIBC, IRON, RETICCTPCT in the last 72 hours. Sepsis Labs: Recent Labs  Lab 05/11/19 1348 05/11/19 1736 05/12/19 0428  05/15/19 0530 05/16/19 0244 05/17/19 0651 05/18/19 0401  WBC  --   --    < > 19.7* 18.3* 15.9* 16.0*  LATICACIDVEN 1.6 1.0  --   --   --   --   --    < > = values in this interval not displayed.    Microbiology Recent Results (from the past 240 hour(s))  Culture, blood (Routine x 2)     Status: Abnormal   Collection Time: 05/10/19  9:35 PM   Specimen: BLOOD  Result Value Ref Range Status   Specimen Description   Final    BLOOD RIGHT FOREARM Performed at [facility], [address], [place], [place] [address]    Special Requests   Final    BOTTLES DRAWN AEROBIC AND ANAEROBIC Blood Culture adequate volume Performed at [facility], [address] [place], [place] [address]    Culture  Setup Time   Final    GRAM NEGATIVE RODS IN BOTH AEROBIC AND ANAEROBIC BOTTLES CRITICAL RESULT CALLED TO, READ BACK BY AND VERIFIED WITH:  JODY BAREFOOT ON 05/11/2019 AT 0939 TIK    Culture (A)  Final    ESCHERICHIA COLI SUSCEPTIBILITIES PERFORMED ON PREVIOUS CULTURE WITHIN THE LAST 5 DAYS. Performed at [facility], [address], [place], [place] [address]    Report Status 05/13/2019 FINAL  Final  Blood Culture ID Panel (Reflexed)     Status: Abnormal   Collection Time: 05/10/19  9:35 PM  Result Value Ref Range Status   Enterococcus species NOT DETECTED NOT DETECTED Final   Listeria monocytogenes NOT DETECTED NOT DETECTED Final   Staphylococcus species NOT DETECTED NOT DETECTED Final   Staphylococcus aureus (BCID) NOT DETECTED NOT DETECTED Final   Streptococcus species NOT DETECTED NOT DETECTED Final   Streptococcus agalactiae NOT DETECTED NOT DETECTED Final   Streptococcus pneumoniae NOT DETECTED NOT DETECTED Final   Streptococcus pyogenes NOT DETECTED NOT DETECTED Final   Acinetobacter baumannii NOT DETECTED NOT DETECTED Final   Enterobacteriaceae species DETECTED (  A) NOT DETECTED Final    Comment: Enterobacteriaceae represent a large family of  gram-negative bacteria, not a single organism. CRITICAL RESULT CALLED TO, READ BACK BY AND VERIFIED WITH:  JODY BAREFOOT ON 05/11/2019 AT 0939 TIK    Enterobacter cloacae complex NOT DETECTED NOT DETECTED Final   Escherichia coli DETECTED (A) NOT DETECTED Final    Comment: CRITICAL RESULT CALLED TO, READ BACK BY AND VERIFIED WITH: JODY BAREFOOT ON 05/11/2019 AT 0939 TIK    Klebsiella oxytoca NOT DETECTED NOT DETECTED Final   Klebsiella pneumoniae NOT DETECTED NOT DETECTED Final   Proteus species NOT DETECTED NOT DETECTED Final   Serratia marcescens NOT DETECTED NOT DETECTED Final   Carbapenem resistance NOT DETECTED NOT DETECTED Final   Haemophilus influenzae NOT DETECTED NOT DETECTED Final   Neisseria meningitidis NOT DETECTED NOT DETECTED Final   Pseudomonas aeruginosa NOT DETECTED NOT DETECTED Final   Candida albicans NOT DETECTED NOT DETECTED Final   Candida glabrata NOT DETECTED NOT DETECTED Final   Candida krusei NOT DETECTED NOT DETECTED Final   Candida parapsilosis NOT DETECTED NOT DETECTED Final   Candida tropicalis NOT DETECTED NOT DETECTED Final    Comment: Performed at [facility], [address] [place], [place] [address]  Culture, blood (Routine x 2)     Status: Abnormal   Collection Time: 05/10/19  9:36 PM   Specimen: BLOOD  Result Value Ref Range Status   Specimen Description   Final    BLOOD RIGHT ANTECUBITAL Performed at [facility], [address] [place], [place] [address]    Special Requests   Final    BOTTLES DRAWN AEROBIC AND ANAEROBIC Blood Culture adequate volume Performed at [facility], [address] [place], [place] [address]    Culture  Setup Time   Final    GRAM NEGATIVE RODS IN BOTH AEROBIC AND ANAEROBIC BOTTLES CRITICAL RESULT CALLED TO, READ BACK BY AND VERIFIED WITH: JODY BAREFOOT ON 05/11/2019 AT 0939 TIK Performed at [facility], [address] [place], [place] [address]    Culture  ESCHERICHIA COLI (A)  Final   Report Status 05/13/2019 FINAL  Final   Organism ID, Bacteria ESCHERICHIA COLI  Final      Susceptibility   Escherichia coli - MIC*    AMPICILLIN <=2 SENSITIVE Sensitive     CEFAZOLIN <=4 SENSITIVE Sensitive     CEFEPIME <=0.12 SENSITIVE Sensitive     CEFTAZIDIME <=1 SENSITIVE Sensitive     CEFTRIAXONE <=0.25 SENSITIVE Sensitive     CIPROFLOXACIN <=0.25 SENSITIVE Sensitive     GENTAMICIN <=1 SENSITIVE Sensitive     IMIPENEM <=0.25 SENSITIVE Sensitive     TRIMETH/SULFA <=20 SENSITIVE Sensitive     AMPICILLIN/SULBACTAM <=2 SENSITIVE Sensitive     PIP/TAZO <=4 SENSITIVE Sensitive     * ESCHERICHIA COLI  Respiratory Panel by RT PCR (Flu A&B, Covid) - Nasopharyngeal Swab     Status: None   Collection Time: 05/10/19  9:36 PM   Specimen: Nasopharyngeal Swab  Result Value Ref Range Status   SARS Coronavirus 2 by RT PCR NEGATIVE NEGATIVE Final    Comment: (NOTE) SARS-CoV-2 target nucleic acids are NOT DETECTED. The SARS-CoV-2 RNA is generally detectable in upper respiratoy specimens during the acute phase of infection. The lowest concentration of SARS-CoV-2 viral copies this assay can detect is 131 copies/mL. A negative result does not preclude SARS-Cov-2 infection and should not be used as the sole basis for treatment or other patient management decisions. A  negative result may occur with  improper specimen collection/handling, submission of specimen other than nasopharyngeal swab, presence of viral mutation(s) within the areas targeted by this assay, and inadequate number of viral copies (<131 copies/mL). A negative result must be combined with clinical observations, patient history, and epidemiological information. The expected result is Negative. Fact Sheet for Patients:  https://www.moore.com/ Fact Sheet for Healthcare Providers:  https://www.young.biz/ This test is not yet ap proved or cleared by the [place] FDA and  has been authorized for detection and/or diagnosis of SARS-CoV-2 by FDA under an Emergency Use Authorization (EUA). This EUA will remain  in effect (meaning this test can be used) for the duration of the COVID-19 declaration under Section 564(b)(1) of the Act, 21 U.S.C. section 360bbb-3(b)(1), unless the authorization is terminated or revoked sooner.    Influenza A by PCR NEGATIVE NEGATIVE Final   Influenza B by PCR NEGATIVE NEGATIVE Final    Comment: (NOTE) The Xpert Xpress SARS-CoV-2/FLU/RSV assay is intended as an aid in  the diagnosis of influenza from Nasopharyngeal swab specimens and  should not be used as a sole basis for treatment. Nasal washings and  aspirates are unacceptable for Xpert Xpress SARS-CoV-2/FLU/RSV  testing. Fact Sheet for Patients: https://www.moore.com/ Fact Sheet for Healthcare Providers: https://www.young.biz/ This test is not yet approved or cleared by the [place] FDA and  has been authorized for detection and/or diagnosis of SARS-CoV-2 by  FDA under an Emergency Use Authorization (EUA). This EUA will remain  in effect (meaning this test can be used) for the duration of the  Covid-19 declaration under Section 564(b)(1) of the Act, 21  U.S.C. section 360bbb-3(b)(1), unless the authorization is  terminated or revoked. Performed at [facility], [address] [place], [place] [address]   Urine Culture     Status: Abnormal   Collection Time: 05/10/19  9:36 PM   Specimen: Urine, Random  Result Value Ref Range Status   Specimen Description   Final    URINE, RANDOM Performed at [facility], [address] [place], [place] [address]    Special Requests   Final    NONE Performed at [facility], [address] [place], [place] [address]    Culture >=100,000 COLONIES/mL ESCHERICHIA COLI (A)  Final   Report Status 05/13/2019 FINAL  Final   Organism ID, Bacteria  ESCHERICHIA COLI (A)  Final      Susceptibility   Escherichia coli - MIC*    AMPICILLIN <=2 SENSITIVE Sensitive     CEFAZOLIN <=4 SENSITIVE Sensitive     CEFTRIAXONE <=0.25 SENSITIVE Sensitive     CIPROFLOXACIN <=0.25 SENSITIVE Sensitive     GENTAMICIN <=1 SENSITIVE Sensitive     IMIPENEM <=0.25 SENSITIVE Sensitive     NITROFURANTOIN <=16 SENSITIVE Sensitive     TRIMETH/SULFA <=20 SENSITIVE Sensitive     AMPICILLIN/SULBACTAM <=2 SENSITIVE Sensitive     PIP/TAZO <=4 SENSITIVE Sensitive     * >=100,000 COLONIES/mL ESCHERICHIA COLI    Procedures and diagnostic studies:  No results found.  Medications:   . acidophilus  1 capsule Oral BID  . amoxicillin  500 mg Oral Q12H  . apixaban  2.5 mg Oral BID  . atorvastatin  80 mg Oral QPM  . carvedilol  12.5 mg Oral BID  . Chlorhexidine Gluconate Cloth  6 each Topical Daily  . diltiazem  180 mg Oral QPM  . feeding supplement (NEPRO CARB STEADY)  237 mL Oral TID BM  . ipratropium-albuterol  3 mL Nebulization TID  . multivitamin with minerals  1 tablet Oral Daily   Continuous Infusions: . dextrose 50 mL/hr at 05/18/19 1200     LOS: 8 days   Sherial Ebrahim  [facility]     05/18/2019, 12:14 PM

## 2019-05-18 NOTE — Plan of Care (Signed)
Continuing with plan of care. 

## 2019-05-19 LAB — CBC WITH DIFFERENTIAL/PLATELET
Abs Immature Granulocytes: 0.07 10*3/uL (ref 0.00–0.07)
Basophils Absolute: 0 10*3/uL (ref 0.0–0.1)
Basophils Relative: 0 %
Eosinophils Absolute: 0.5 10*3/uL (ref 0.0–0.5)
Eosinophils Relative: 3 %
HCT: 32.7 % — ABNORMAL LOW (ref 39.0–52.0)
Hemoglobin: 10.4 g/dL — ABNORMAL LOW (ref 13.0–17.0)
Immature Granulocytes: 1 %
Lymphocytes Relative: 4 %
Lymphs Abs: 0.6 10*3/uL — ABNORMAL LOW (ref 0.7–4.0)
MCH: 30.8 pg (ref 26.0–34.0)
MCHC: 31.8 g/dL (ref 30.0–36.0)
MCV: 96.7 fL (ref 80.0–100.0)
Monocytes Absolute: 0.8 10*3/uL (ref 0.1–1.0)
Monocytes Relative: 5 %
Neutro Abs: 13.2 10*3/uL — ABNORMAL HIGH (ref 1.7–7.7)
Neutrophils Relative %: 87 %
Platelets: 98 10*3/uL — ABNORMAL LOW (ref 150–400)
RBC: 3.38 MIL/uL — ABNORMAL LOW (ref 4.22–5.81)
RDW: 16.8 % — ABNORMAL HIGH (ref 11.5–15.5)
WBC: 15.1 10*3/uL — ABNORMAL HIGH (ref 4.0–10.5)
nRBC: 0 % (ref 0.0–0.2)

## 2019-05-19 LAB — BASIC METABOLIC PANEL
Anion gap: 4 — ABNORMAL LOW (ref 5–15)
BUN: 59 mg/dL — ABNORMAL HIGH (ref 8–23)
CO2: 26 mmol/L (ref 22–32)
Calcium: 8.2 mg/dL — ABNORMAL LOW (ref 8.9–10.3)
Chloride: 117 mmol/L — ABNORMAL HIGH (ref 98–111)
Creatinine, Ser: 1.62 mg/dL — ABNORMAL HIGH (ref 0.61–1.24)
GFR calc Af Amer: 43 mL/min — ABNORMAL LOW (ref >60)
GFR calc non Af Amer: 37 mL/min — ABNORMAL LOW (ref >60)
Glucose, Bld: 102 mg/dL — ABNORMAL HIGH (ref 70–99)
Potassium: 3.5 mmol/L (ref 3.5–5.1)
Sodium: 147 mmol/L — ABNORMAL HIGH (ref 135–145)

## 2019-05-19 MED ORDER — DILTIAZEM HCL ER COATED BEADS 120 MG PO CP24: 120.0000 mg | ORAL_CAPSULE | Freq: Every evening | ORAL | 0 refills | Status: DC

## 2019-05-19 NOTE — Progress Notes (Signed)
Occupational Therapy Treatment Patient Details Name: Jordan Kline MRN: [id number] DOB: [birth year] Today's Date: 05/19/2019    History of present illness Jordan Kline is a 48yoM who comes to [facility] on 2/13 c AMS. Wife suspected UTI. In ED noted to have anemia and soft BP. Patient had 13 beat run of nonsustained V. tach on telemetry on 2/17. PMH: hypertension, dyslipidemia, CVA, AF, and chronic indwelling Foley catheter. Per CSW note, wife provides caregiver assistance at home, pt largely bed bound, has [place].   OT comments  Met with patient for grooming skills and to discuss rec for home set up to assist with positioning and reduce bed sores.  His wife was not present for session and rec one more OT session for caregiver ed and training in early afternoon when his wife visits.  Left adaptive equip catalog for pt and his wife to review for any further needs at home.   Follow Up Recommendations  Supervision/Assistance - 24 hour;Other (comment)    Equipment Recommendations       Recommendations for Other Services      Precautions / Restrictions Precautions Precautions: Fall Required Braces or Orthoses: Other Brace Other Brace: Patient states he has an AFO Restrictions Weight Bearing Restrictions: No       Mobility Bed Mobility                  Transfers                      Balance                                           ADL either performed or assessed with clinical judgement   ADL Overall ADL's : Needs assistance/impaired                                       General ADL Comments: Met with patient for grooming skills and to discuss rec for home set up to assist with positioning and reduce bed sores.  His wife was not present for session and rec one more OT session for caregiver ed and training in early afternoon when his wife visits.  Left adaptive equip catalog for pt and his wife to review for any further needs at  home.     Vision Baseline Vision/History: Wears glasses Wears Glasses: At all times     Perception     Praxis      Cognition Arousal/Alertness: Awake/alert Behavior During Therapy: WFL for tasks assessed/performed Overall Cognitive Status: History of cognitive impairments - at baseline                                          Exercises     Shoulder Instructions       General Comments      Pertinent Vitals/ Pain          Home Living                                          Prior Functioning/Environment  Frequency  Min 1X/week        Progress Toward Goals  OT Goals(current goals can now be found in the care plan section)  Progress towards OT goals: Progressing toward goals  Acute Rehab OT Goals Patient Stated Goal: Go home OT Goal Formulation: With patient Time For Goal Achievement: 05/29/19 Potential to Achieve Goals: Good  Plan      Co-evaluation                 AM-PAC OT "6 Clicks" Daily Activity     Outcome Measure   Help from another person eating meals?: A Lot Help from another person taking care of personal grooming?: A Lot Help from another person toileting, which includes using toliet, bedpan, or urinal?: Total Help from another person bathing (including washing, rinsing, drying)?: Total Help from another person to put on and taking off regular upper body clothing?: Total Help from another person to put on and taking off regular lower body clothing?: Total 6 Click Score: 8    End of Session    OT Visit Diagnosis: Muscle weakness (generalized) (M62.81)   Activity Tolerance Patient tolerated treatment well   Patient Left in bed;with call bell/phone within reach;with bed alarm set   Nurse Communication Other (comment)(discussed lab values before session and seen for in bed activity only)        Time: 3244-0102 OT Time Calculation (min): 15 min  Charges: OT General  Charges $OT Visit: 1 Visit OT Treatments $Self Care/Home Management : 8-22 mins  Chrys Racer, OTR/L, [place] ascom [phone] 05/19/19, 11:48 AM

## 2019-05-19 NOTE — Progress Notes (Signed)
Smith Mince to be D/C'd home per MD order.  Discussed prescriptions and follow up appointments with the patient. Prescriptions given to patient, medication list explained in detail. Pt verbalized understanding.  Allergies as of 05/19/2019   No Known Allergies     Medication List    TAKE these medications   acetaminophen 325 MG tablet Commonly known as: TYLENOL Take 650 mg by mouth every 4 (four) hours as needed. for pain/ increased temp. May be administered orally, per G-tube if needed or rectally if unable to swallow (separate order). Maximum dose for 24 hours is 3,000 mg from all sources of Acetaminophen/ Tylenol   apixaban 2.5 MG Tabs tablet Commonly known as: ELIQUIS Take 1 tablet (2.5 mg total) by mouth 2 (two) times daily.   atorvastatin 80 MG tablet Commonly known as: LIPITOR Take 80 mg by mouth every evening.   carvedilol 6.25 MG tablet Commonly known as: COREG Take 1 tablet (6.25 mg total) by mouth 2 (two) times daily.   Dermacloud Crea Apply liberal amount topically to area of skin irritation prn. OK to leave at bedside.   diltiazem 120 MG 24 hr capsule Commonly known as: CARDIZEM CD Take 1 capsule (120 mg total) by mouth every evening.   docusate sodium 100 MG capsule Commonly known as: COLACE Take 100 mg by mouth 2 (two) times daily.   ENSURE ENLIVE PO Take 1 Bottle by mouth 3 (three) times daily. For 8 oz bottle, add 2 tablespoons of thickner. Stir for 15 seconds until dissolved.   feeding supplement (PRO-STAT SUGAR FREE 64) Liqd Take 30 mLs by mouth 2 (two) times daily between meals.   ferrous sulfate 325 (65 FE) MG tablet Take 1 tablet (325 mg total) by mouth 2 (two) times daily with a meal.   food thickener Powd Commonly known as: THICK IT Take by mouth. Use to mix thin liquids to nectar thick consistency as needed   multivitamin with minerals Tabs tablet Take 1 tablet by mouth daily.   mupirocin cream 2 % Commonly known as: BACTROBAN Apply  thin film topically to open areas on penis 2 times a day until healed   Risa-Bid Probiotic Tabs Take 1 tablet by mouth 2 (two) times daily.   sacubitril-valsartan 49-51 MG Commonly known as: ENTRESTO Take 1 tablet by mouth 2 (two) times daily.            Durable Medical Equipment  (From admission, onward)         Start     Ordered   05/19/19 0000  For home use only DME lightweight manual wheelchair with seat cushion    Comments: Patient suffers from chronic CHF which impairs their ability to perform daily activities like toileting in the home.  A walker will not resolve  issue with performing activities of daily living. A wheelchair will allow patient to safely perform daily activities. Patient is not able to propel themselves in the home using a standard weight wheelchair due to general weakness. Patient can self propel in the lightweight wheelchair. Length of need Lifetime.  With back cushion Accessories: elevating leg rests (ELRs), wheel locks, extensions and anti-tippers.   05/19/19 1457          Vitals:   05/19/19 1425 05/19/19 1637  BP: (!) 148/108 (!) 170/100  Pulse:  89  Resp:  18  Temp:  98.4 F (36.9 C)  SpO2:  100%    Skin clean, dry and intact without evidence of skin break down, no evidence  of skin tears noted. IV catheter discontinued intact. Site without signs and symptoms of complications. Dressing and pressure applied. Pt denies pain at this time. No complaints noted.  An After Visit Summary was printed and given to the patient. Patient escorted via EMS.  Leonides Cave 05/19/2019 5:17 PM

## 2019-05-19 NOTE — Progress Notes (Signed)
   05/19/19 1130  Clinical Encounter Type  Visited With Patient  Visit Type Follow-up  Referral From Chaplain  Consult/Referral To Chaplain  Whil rounding Chaplain briefly visited with patient. Chaplain offered pastoral presence, empathy, and prayer.

## 2019-05-19 NOTE — Progress Notes (Addendum)
Nutrition Follow Up Note   DOCUMENTATION CODES:   Severe malnutrition in context of chronic illness  INTERVENTION:   Nepro Shake po BID, each supplement provides 425 kcal and 19 grams protein  Magic cup TID with meals, each supplement provides 290 kcal and 9 grams of protein  MVI daily   Consider appetite stimulant   NUTRITION DIAGNOSIS:   Severe Malnutrition related to chronic illness(h/o stroke, advanced age) as evidenced by severe muscle depletion, moderate to severe fat depletions.  GOAL:   Patient will meet greater than or equal to 90% of their needs -not met   MONITOR:   PO intake, Supplement acceptance, Labs, Weight trends, Skin, I & O's  ASSESSMENT:   86 y.o. African-American male with a known history of hypertension, dyslipidemia, CVA and chronic indwelling Foley catheter, as well as atrial fibrillation, who presented to the emergency room with acute onset of altered mental status. Pt found to have UTI with sepsis and GIB   Pt continues to do poorly. Pt eating only sips and bites of meals but is drinking some Nepro. Palliative care following; family wishes to treat the treatable but no CPR or intubation. Feeding tube was not discussed. Recommend continue supplements and MVI. Can consider appetite stimulant. No new weight since admit; will request weekly weights.   Medications reviewed and include: risaquad, amoxicillin, 5% dextrose '@50ml' /hr, MVI   Labs reviewed: Na 147(H), BUN 59(H), creat 1.62(H) Wbc- 15.1(H), Hgb 10.4(L), Hct 32.7(L)  Diet Order:   Diet Order            DIET - DYS 1 Room service appropriate? Yes with Assist; Fluid consistency: Thin  Diet effective now             EDUCATION NEEDS:   Education needs have been addressed  Skin:  Skin Assessment: Reviewed RN Assessment(Stage I buttocks)  Last BM:  2/22- TYPE 4  Height:   Ht Readings from Last 1 Encounters:  05/11/19 '5\' 8"'  (1.727 m)    Weight:   Wt Readings from Last 1 Encounters:   05/11/19 58.9 kg    Ideal Body Weight:  70 kg  BMI:  Body mass index is 19.74 kg/m.  Estimated Nutritional Needs:   Kcal:  1700-1900kcal/day  Protein:  85-95g/day  Fluid:  >1.5L/day  Koleen Distance MS, RD, LDN Contact information available in Amion

## 2019-05-19 NOTE — Care Management Important Message (Signed)
Important Message  Patient Details  Name: Jordan Kline MRN: [id number] Date of Birth: Mar 17, 1928   Medicare Important Message Given:  Yes     [name] 05/19/2019, 11:36 AM

## 2019-05-19 NOTE — Discharge Summary (Signed)
Physician Discharge Summary  Jordan PennaDouglas Kline WUJ:[id number] DOB: 1927/08/12 DOA: 05/10/2019  PCP: [name], S Ahmed, MD  Admit date: 05/10/2019 Discharge date: 05/19/2019  Discharge disposition: Home   Recommendations for Outpatient Follow-Up:   Follow-up with PCP in 1 week Follow-up with palliative care team as scheduled   Discharge Diagnosis:   Active Problems:   GIB (gastrointestinal bleeding)   Sepsis due to gram-negative UTI (HCC)   Sepsis secondary to UTI (HCC)   Pressure injury of skin   AKI (acute kidney injury) (HCC)   Atrial fibrillation with RVR (HCC)   Chronic CHF (HCC)   Hypernatremia   Altered mental status   Palliative care by specialist    Discharge Condition: Stable.  Diet recommendation: Low-salt diet  Code status: DNR.    Hospital Course:   [name] a86 y.o.African-American malewith a known history ofhypertension, dyslipidemia, CVA, chronic urinary retention with indwelling Foley catheter, atrial fibrillation on Eliquis, recurrent hypernatremia, CKD stage IIIb, chronic diastolic CHF, decubitus ulcers on bilateral buttocks, who presented to the emergency room with acute onset ofaltered mental status.Per his wife he had his indwelling Foley catheter replaced a few days prior to admission and was noted to be altered more than usual (for about 1 day prior to admission). He had similar presentation with previous UTIs. No reported nausea or vomiting or abdominal pain. He was noted to have melena in the ER.   Upon presentation to the emergency room, vital signs revealed blood pressure of 78/53 with a pulse of 117 and respirate 33 with pulse oximetry 100% on room air attention 98.7. Labs revealed BUN of122 with a creatinine of 4.44 compared to 66 and 1.58,lactic acid of 2 and CBC with leukocytosis 12.3with neutrophilia as well as anemia with hemoglobin of 7 hematocrit 23.4 compared to10 and 29.2 in January 2019. INR is 1.9 with  PTT 21.4. Urinalysis was strongly positive for UTI. The patient had grossly melanotic stool upon rectal exam by ER physician with positive stool Hemoccult.  Patient was seen by the gastroenterologist for occult GI bleeding.  However, patient his wife declined endoscopic work-up.  Fortunately, his H&H has remained stable.  Patient was found to have E. coli sepsis bacteremia secondary to UTI.  He was treated with empiric IV antibiotics and transition to amoxicillin.  He has completed 9 days of antibiotics.  He also had hyponatremia and AKI on CKD stage IIIb.  He was treated with IV fluids and creatinine and sodium levels have improved.  He had toxic metabolic encephalopathy that has resolved as well.  He has chronic diastolic CHF and he had intermittent PVCs and nonsustained V. tach on telemetry.  2D echo showed EF estimated at 50 to 55%.  His condition has improved.  He is back to his baseline.  He has been weaned off of oxygen.  He is deemed stable for discharge to home.  He is followed by the palliative care team at home.  Discharge plan was discussed with his wife.  His wife understands that he is at increased risk of dehydration and current AKI and hypernatremia because of poor oral intake.  She said she would make sure the patient stays well-hydrated at home.     Discharge Exam:   Vitals:   05/19/19 1421 05/19/19 1425  BP: (!) 157/92 (!) 148/108  Pulse: 85   Resp: 16   Temp: 98.1 F (36.7 C)   SpO2: 100%    Vitals:   05/19/19 0907 05/19/19 1156 05/19/19 1421 05/19/19 1425  BP: (!) 144/90 140/83 (!) 157/92 (!) 148/108  Pulse: 81 79 85   Resp: 18 16 16    Temp: (!) 97.3 F (36.3 C) 98.2 F (36.8 C) 98.1 F (36.7 C)   TempSrc: Oral Oral Oral   SpO2: 100% 100% 100%   Weight:      Height:         GEN: NAD SKIN: No rash EYES: EOMI ENT: MMM CV: RRR PULM: CTA B ABD: soft, ND, NT, +BS CNS: AAO x 3, non focal EXT: No edema or tenderness   The results of significant  diagnostics from this hospitalization (including imaging, microbiology, ancillary and laboratory) are listed below for reference.     Procedures and Diagnostic Studies:   DG Chest Port 1 View  Result Date: 05/15/2019 CLINICAL DATA:  CHF EXAM: PORTABLE CHEST 1 VIEW COMPARISON:  05/13/2019 FINDINGS: Stable mild cardiomegaly. Calcific aortic knob. Mild pulmonary vascular congestion. No new focal airspace consolidation. No pleural effusion or pneumothorax. IMPRESSION: Similar appearance of mild pulmonary vascular congestion. Electronically Signed   By: 05/15/2019 D.O.   On: 05/15/2019 12:24   ECHOCARDIOGRAM COMPLETE  Result Date: 05/15/2019    ECHOCARDIOGRAM REPORT   Patient Name:   Jordan Kline Date of Exam: 05/15/2019 Medical Rec #:  05/17/2019        Height:       68.0 in Accession #:    [id number]       Weight:       129.9 lb Date of Birth:  May 06, 1927        BSA:          1.70 m Patient Age:    86 years         BP:           147/95 mmHg Patient Gender: M                HR:           76 bpm. Exam Location:  [facility] Procedure: 2D Echo, Cardiac Doppler and Color Doppler Indications:     CHF 428.0 Ventricular tachycardia 147.2  History:         Patient has no prior history of Echocardiogram examinations.                  Stroke, Arrythmias:Atrial Fibrillation; Risk                  Factors:Hypertension.  Sonographer:     05/14/19[birth year] RDCS (AE) Referring Phys:  Cristela Blue [id number] Diagnosing Phys: Lurene Shadow MD IMPRESSIONS  1. Left ventricular ejection fraction, by estimation, is 50 to 55%. The left ventricle has low normal function. The left ventricle has no regional wall motion abnormalities. Left ventricular diastolic parameters are indeterminate.  2. Right ventricular systolic function is normal. The right ventricular size is mildly enlarged. There is severely elevated pulmonary artery systolic pressure.  3. Left atrial size was severely dilated.  4. Right atrial size was severely dilated.  5.  Moderate to severe mitral valve regurgitation.  6. Tricuspid valve regurgitation is moderate to severe.  7. The inferior vena cava is dilated in size with <50% respiratory variability, suggesting right atrial pressure of 15 mmHg. FINDINGS  Left Ventricle: Left ventricular ejection fraction, by estimation, is 50 to 55%. The left ventricle has low normal function. The left ventricle has no regional wall motion abnormalities. The left ventricular internal cavity size was normal in size. There is no left ventricular hypertrophy. Left  ventricular diastolic parameters are indeterminate. Right Ventricle: The right ventricular size is mildly enlarged. No increase in right ventricular wall thickness. Right ventricular systolic function is normal. There is severely elevated pulmonary artery systolic pressure. The tricuspid regurgitant velocity is 4.15 m/s, and with an assumed right atrial pressure of 15 mmHg, the estimated right ventricular systolic pressure is 83.9 mmHg. Left Atrium: Left atrial size was severely dilated. Right Atrium: Right atrial size was severely dilated. Pericardium: There is no evidence of pericardial effusion. Mitral Valve: The mitral valve is normal in structure and function. Normal mobility of the mitral valve leaflets. Moderate to severe mitral valve regurgitation. No evidence of mitral valve stenosis. Tricuspid Valve: The tricuspid valve is normal in structure. Tricuspid valve regurgitation is moderate to severe. No evidence of tricuspid stenosis. Aortic Valve: The aortic valve is normal in structure and function. Aortic valve regurgitation is not visualized. Mild to moderate aortic valve sclerosis/calcification is present, without any evidence of aortic stenosis. Aortic valve mean gradient measures 3.2 mmHg. Aortic valve peak gradient measures 5.1 mmHg. Aortic valve area, by VTI measures 1.81 cm. Pulmonic Valve: The pulmonic valve was normal in structure. Pulmonic valve regurgitation is not  visualized. No evidence of pulmonic stenosis. Aorta: The aortic root is normal in size and structure. Venous: The inferior vena cava is dilated in size with less than 50% respiratory variability, suggesting right atrial pressure of 15 mmHg. IAS/Shunts: No atrial level shunt detected by color flow Doppler.  LEFT VENTRICLE PLAX 2D LVIDd:         4.67 cm LVIDs:         3.21 cm LV PW:         1.18 cm LV IVS:        0.85 cm LVOT diam:     2.10 cm LV SV:         39.83 ml LV SV Index:   35.58 LVOT Area:     3.46 cm  RIGHT VENTRICLE RV Basal diam:  3.01 cm RV S prime:     12.30 cm/s TAPSE (M-mode): 3.4 cm LEFT ATRIUM             Index       RIGHT ATRIUM           Index LA diam:        5.40 cm 3.17 cm/m  RA Area:     25.60 cm LA Vol (A2C):   55.9 ml 32.86 ml/m RA Volume:   78.40 ml  46.09 ml/m LA Vol (A4C):   76.2 ml 44.80 ml/m LA Biplane Vol: 70.6 ml 41.51 ml/m  AORTIC VALVE                   PULMONIC VALVE AV Area (Vmax):    1.79 cm    PV Vmax:        0.64 m/s AV Area (Vmean):   1.85 cm    PV Peak grad:   1.6 mmHg AV Area (VTI):     1.81 cm    RVOT Peak grad: 4 mmHg AV Vmax:           113.25 cm/s AV Vmean:          78.450 cm/s AV VTI:            0.220 m AV Peak Grad:      5.1 mmHg AV Mean Grad:      3.2 mmHg LVOT Vmax:         58.40  cm/s LVOT Vmean:        41.800 cm/s LVOT VTI:          0.115 m LVOT/AV VTI ratio: 0.52  AORTA Ao Root diam: 3.20 cm MITRAL VALVE                TRICUSPID VALVE MV Area (PHT): 4.36 cm     TR Peak grad:   68.9 mmHg MV Decel Time: 174 msec     TR Vmax:        415.00 cm/s MV E velocity: 107.00 cm/s                             SHUNTS                             Systemic VTI:  0.12 m                             Systemic Diam: 2.10 cm Julien Nordmann MD Electronically signed by Julien Nordmann MD Signature Date/Time: 05/15/2019/11:22:46 AM    Final      Labs:   Basic Metabolic Panel: Recent Labs  Lab 05/15/19 0530 05/15/19 0530 05/16/19 0244 05/16/19 0244 05/17/19 3524  05/17/19 0651 05/18/19 0401 05/19/19 0328  NA 154*  --  153*  --  153*  --  149* 147*  K 4.2   < > 3.9   < > 3.6   < > 3.5 3.5  CL 127*  --  125*  --  120*  --  119* 117*  CO2 22  --  21*  --  22  --  26 26  GLUCOSE 137*  --  123*  --  115*  --  114* 102*  BUN 111*  --  95*  --  80*  --  71* 59*  CREATININE 2.90*  --  2.44*  --  1.92*  --  1.75* 1.62*  CALCIUM 8.4*  --  8.4*  --  8.6*  --  8.2* 8.2*  MG 2.4  --   --   --   --   --   --   --    < > = values in this interval not displayed.   GFR Estimated Creatinine Clearance: 25.2 mL/min (A) (by C-G formula based on SCr of 1.62 mg/dL (H)). Liver Function Tests: No results for input(s): AST, ALT, ALKPHOS, BILITOT, PROT, ALBUMIN in the last 168 hours. No results for input(s): LIPASE, AMYLASE in the last 168 hours. No results for input(s): AMMONIA in the last 168 hours. Coagulation profile No results for input(s): INR, PROTIME in the last 168 hours.  CBC: Recent Labs  Lab 05/15/19 0530 05/16/19 0244 05/17/19 0651 05/18/19 0401 05/19/19 0328  WBC 19.7* 18.3* 15.9* 16.0* 15.1*  NEUTROABS 17.5* 16.9* 14.3* 14.3* 13.2*  HGB 10.2* 10.5* 11.2* 11.1* 10.4*  HCT 31.4* 32.3* 35.2* 34.1* 32.7*  MCV 96.3 95.6 94.9 95.0 96.7  PLT 92* 85* 88* 93* 98*   Cardiac Enzymes: No results for input(s): CKTOTAL, CKMB, CKMBINDEX, TROPONINI in the last 168 hours. BNP: Invalid input(s): POCBNP CBG: No results for input(s): GLUCAP in the last 168 hours. D-Dimer No results for input(s): DDIMER in the last 72 hours. Hgb A1c No results for input(s): HGBA1C in the last 72 hours. Lipid Profile No results for input(s): CHOL, HDL, LDLCALC, TRIG, CHOLHDL,  LDLDIRECT in the last 72 hours. Thyroid function studies No results for input(s): TSH, T4TOTAL, T3FREE, THYROIDAB in the last 72 hours.  Invalid input(s): FREET3 Anemia work up No results for input(s): VITAMINB12, FOLATE, FERRITIN, TIBC, IRON, RETICCTPCT in the last 72 hours. Microbiology Recent  Results (from the past 240 hour(s))  Culture, blood (Routine x 2)     Status: Abnormal   Collection Time: 05/10/19  9:35 PM   Specimen: BLOOD  Result Value Ref Range Status   Specimen Description   Final    BLOOD RIGHT FOREARM Performed at [facility], [address] [place], [place] [address]    Special Requests   Final    BOTTLES DRAWN AEROBIC AND ANAEROBIC Blood Culture adequate volume Performed at [facility], [address] [place], [place] [address]    Culture  Setup Time   Final    GRAM NEGATIVE RODS IN BOTH AEROBIC AND ANAEROBIC BOTTLES CRITICAL RESULT CALLED TO, READ BACK BY AND VERIFIED WITH:  JODY BAREFOOT ON 05/11/2019 AT 0939 TIK    Culture (A)  Final    ESCHERICHIA COLI SUSCEPTIBILITIES PERFORMED ON PREVIOUS CULTURE WITHIN THE LAST 5 DAYS. Performed at [facility], [address] [place], [place] [address]    Report Status 05/13/2019 FINAL  Final  Blood Culture ID Panel (Reflexed)     Status: Abnormal   Collection Time: 05/10/19  9:35 PM  Result Value Ref Range Status   Enterococcus species NOT DETECTED NOT DETECTED Final   Listeria monocytogenes NOT DETECTED NOT DETECTED Final   Staphylococcus species NOT DETECTED NOT DETECTED Final   Staphylococcus aureus (BCID) NOT DETECTED NOT DETECTED Final   Streptococcus species NOT DETECTED NOT DETECTED Final   Streptococcus agalactiae NOT DETECTED NOT DETECTED Final   Streptococcus pneumoniae NOT DETECTED NOT DETECTED Final   Streptococcus pyogenes NOT DETECTED NOT DETECTED Final   Acinetobacter baumannii NOT DETECTED NOT DETECTED Final   Enterobacteriaceae species DETECTED (A) NOT DETECTED Final    Comment: Enterobacteriaceae represent a large family of gram-negative bacteria, not a single organism. CRITICAL RESULT CALLED TO, READ BACK BY AND VERIFIED WITH:  JODY BAREFOOT ON 05/11/2019 AT 0939 TIK    Enterobacter cloacae complex NOT DETECTED NOT DETECTED Final   Escherichia coli  DETECTED (A) NOT DETECTED Final    Comment: CRITICAL RESULT CALLED TO, READ BACK BY AND VERIFIED WITH: JODY BAREFOOT ON 05/11/2019 AT 0939 TIK    Klebsiella oxytoca NOT DETECTED NOT DETECTED Final   Klebsiella pneumoniae NOT DETECTED NOT DETECTED Final   Proteus species NOT DETECTED NOT DETECTED Final   Serratia marcescens NOT DETECTED NOT DETECTED Final   Carbapenem resistance NOT DETECTED NOT DETECTED Final   Haemophilus influenzae NOT DETECTED NOT DETECTED Final   Neisseria meningitidis NOT DETECTED NOT DETECTED Final   Pseudomonas aeruginosa NOT DETECTED NOT DETECTED Final   Candida albicans NOT DETECTED NOT DETECTED Final   Candida glabrata NOT DETECTED NOT DETECTED Final   Candida krusei NOT DETECTED NOT DETECTED Final   Candida parapsilosis NOT DETECTED NOT DETECTED Final   Candida tropicalis NOT DETECTED NOT DETECTED Final    Comment: Performed at [facility], [address] [place], [place] [address]  Culture, blood (Routine x 2)     Status: Abnormal   Collection Time: 05/10/19  9:36 PM   Specimen: BLOOD  Result Value Ref Range Status   Specimen Description   Final    BLOOD RIGHT ANTECUBITAL Performed at [facility], [address] [place], [place] [address]    Special Requests   Final    BOTTLES DRAWN AEROBIC AND ANAEROBIC Blood Culture adequate volume Performed at [facility], [address] [place], [place] [address]    Culture  Setup Time   Final    GRAM NEGATIVE RODS IN BOTH AEROBIC AND ANAEROBIC BOTTLES CRITICAL RESULT CALLED TO, READ BACK BY AND VERIFIED WITH: JODY BAREFOOT ON 05/11/2019 AT 8295 TIK Performed at [facility], [address] [place], [place] [address]    Culture ESCHERICHIA COLI (A)  Final   Report Status 05/13/2019 FINAL  Final   Organism ID, Bacteria ESCHERICHIA COLI  Final      Susceptibility   Escherichia coli - MIC*    AMPICILLIN <=2 SENSITIVE Sensitive     CEFAZOLIN <=4 SENSITIVE Sensitive      CEFEPIME <=0.12 SENSITIVE Sensitive     CEFTAZIDIME <=1 SENSITIVE Sensitive     CEFTRIAXONE <=0.25 SENSITIVE Sensitive     CIPROFLOXACIN <=0.25 SENSITIVE Sensitive     GENTAMICIN <=1 SENSITIVE Sensitive     IMIPENEM <=0.25 SENSITIVE Sensitive     TRIMETH/SULFA <=20 SENSITIVE Sensitive     AMPICILLIN/SULBACTAM <=2 SENSITIVE Sensitive     PIP/TAZO <=4 SENSITIVE Sensitive     * ESCHERICHIA COLI  Respiratory Panel by RT PCR (Flu A&B, Covid) - Nasopharyngeal Swab     Status: None   Collection Time: 05/10/19  9:36 PM   Specimen: Nasopharyngeal Swab  Result Value Ref Range Status   SARS Coronavirus 2 by RT PCR NEGATIVE NEGATIVE Final    Comment: (NOTE) SARS-CoV-2 target nucleic acids are NOT DETECTED. The SARS-CoV-2 RNA is generally detectable in upper respiratoy specimens during the acute phase of infection. The lowest concentration of SARS-CoV-2 viral copies this assay can detect is 131 copies/mL. A negative result does not preclude SARS-Cov-2 infection and should not be used as the sole basis for treatment or other patient management decisions. A negative result may occur with  improper specimen collection/handling, submission of specimen other than nasopharyngeal swab, presence of viral mutation(s) within the areas targeted by this assay, and inadequate number of viral copies (<131 copies/mL). A negative result must be combined with clinical observations, patient history, and epidemiological information. The expected result is Negative. Fact Sheet for Patients:  https://www.moore.com/ Fact Sheet for Healthcare Providers:  https://www.young.biz/ This test is not yet ap proved or cleared by the [place] FDA and  has been authorized for detection and/or diagnosis of SARS-CoV-2 by FDA under an Emergency Use Authorization (EUA). This EUA will remain  in effect (meaning this test can be used) for the duration of the COVID-19 declaration under  Section 564(b)(1) of the Act, 21 U.S.C. section 360bbb-3(b)(1), unless the authorization is terminated or revoked sooner.    Influenza A by PCR NEGATIVE NEGATIVE Final   Influenza B by PCR NEGATIVE NEGATIVE Final    Comment: (NOTE) The Xpert Xpress SARS-CoV-2/FLU/RSV assay is intended as an aid in  the diagnosis of influenza from Nasopharyngeal swab specimens and  should not be used as a sole basis for treatment. Nasal washings and  aspirates are unacceptable for Xpert Xpress SARS-CoV-2/FLU/RSV  testing. Fact Sheet for Patients: https://www.moore.com/ Fact Sheet for Healthcare Providers: https://www.young.biz/ This test is not yet approved or cleared by the [place] FDA and  has been authorized for detection and/or diagnosis of SARS-CoV-2 by  FDA under an Emergency Use Authorization (EUA). This EUA will remain  in effect (meaning this test can  be used) for the duration of the  Covid-19 declaration under Section 564(b)(1) of the Act, 21  U.S.C. section 360bbb-3(b)(1), unless the authorization is  terminated or revoked. Performed at [facility], [address] [place], [place] [address]   Urine Culture     Status: Abnormal   Collection Time: 05/10/19  9:36 PM   Specimen: Urine, Random  Result Value Ref Range Status   Specimen Description   Final    URINE, RANDOM Performed at [facility], [address] [place], [place] [address]    Special Requests   Final    NONE Performed at [facility], [address] [place], [place] [address]    Culture >=100,000 COLONIES/mL ESCHERICHIA COLI (A)  Final   Report Status 05/13/2019 FINAL  Final   Organism ID, Bacteria ESCHERICHIA COLI (A)  Final      Susceptibility   Escherichia coli - MIC*    AMPICILLIN <=2 SENSITIVE Sensitive     CEFAZOLIN <=4 SENSITIVE Sensitive     CEFTRIAXONE <=0.25 SENSITIVE Sensitive     CIPROFLOXACIN <=0.25 SENSITIVE Sensitive      GENTAMICIN <=1 SENSITIVE Sensitive     IMIPENEM <=0.25 SENSITIVE Sensitive     NITROFURANTOIN <=16 SENSITIVE Sensitive     TRIMETH/SULFA <=20 SENSITIVE Sensitive     AMPICILLIN/SULBACTAM <=2 SENSITIVE Sensitive     PIP/TAZO <=4 SENSITIVE Sensitive     * >=100,000 COLONIES/mL ESCHERICHIA COLI     Discharge Instructions:   Discharge Instructions    Diet - low sodium heart healthy   Complete by: As directed    Face-to-face encounter (required for Medicare/Medicaid patients)   Complete by: As directed    I Mikaylee Arseneau certify that this patient is under my care and that I, or a nurse practitioner or physician's assistant working with me, had a face-to-face encounter that meets the physician face-to-face encounter requirements with this patient on 05/19/2019. The encounter with the patient was in whole, or in part for the following medical condition(s) which is the primary reason for home health care (List medical condition): Assist with medication management   The encounter with the patient was in whole, or in part, for the following medical condition, which is the primary reason for home health care: Assist with medication management   I certify that, based on my findings, the following services are medically necessary home health services: Nursing   Reason for Medically Necessary Home Health Services:  Skilled Nursing- Change/Decline in Patient Status Skilled Nursing- Changes in Medication/Medication Management     My clinical findings support the need for the above services: Unable to leave home safely without assistance and/or assistive device   Further, I certify that my clinical findings support that this patient is homebound due to: Unable to leave home safely without assistance   For home use only DME lightweight manual wheelchair with seat cushion   Complete by: As directed    Patient suffers from chronic CHF which impairs their ability to perform daily activities like toileting in  the home.  A walker will not resolve  issue with performing activities of daily living. A wheelchair will allow patient to safely perform daily activities. Patient is not able to propel themselves in the home using a standard weight wheelchair due to general weakness. Patient can self propel in the lightweight wheelchair. Length of need Lifetime.  With back cushion Accessories: elevating leg rests (ELRs), wheel locks, extensions and anti-tippers.   Home Health   Complete  by: As directed    To provide the following care/treatments: RN   Increase activity slowly   Complete by: As directed      Allergies as of 05/19/2019   No Known Allergies     Medication List    TAKE these medications   acetaminophen 325 MG tablet Commonly known as: TYLENOL Take 650 mg by mouth every 4 (four) hours as needed. for pain/ increased temp. May be administered orally, per G-tube if needed or rectally if unable to swallow (separate order). Maximum dose for 24 hours is 3,000 mg from all sources of Acetaminophen/ Tylenol   apixaban 2.5 MG Tabs tablet Commonly known as: ELIQUIS Take 1 tablet (2.5 mg total) by mouth 2 (two) times daily.   atorvastatin 80 MG tablet Commonly known as: LIPITOR Take 80 mg by mouth every evening.   carvedilol 6.25 MG tablet Commonly known as: COREG Take 1 tablet (6.25 mg total) by mouth 2 (two) times daily.   Dermacloud Crea Apply liberal amount topically to area of skin irritation prn. OK to leave at bedside.   diltiazem 120 MG 24 hr capsule Commonly known as: CARDIZEM CD Take 1 capsule (120 mg total) by mouth every evening.   docusate sodium 100 MG capsule Commonly known as: COLACE Take 100 mg by mouth 2 (two) times daily.   ENSURE ENLIVE PO Take 1 Bottle by mouth 3 (three) times daily. For 8 oz bottle, add 2 tablespoons of thickner. Stir for 15 seconds until dissolved.   feeding supplement (PRO-STAT SUGAR FREE 64) Liqd Take 30 mLs by mouth 2 (two) times daily  between meals.   ferrous sulfate 325 (65 FE) MG tablet Take 1 tablet (325 mg total) by mouth 2 (two) times daily with a meal.   food thickener Powd Commonly known as: THICK IT Take by mouth. Use to mix thin liquids to nectar thick consistency as needed   multivitamin with minerals Tabs tablet Take 1 tablet by mouth daily.   mupirocin cream 2 % Commonly known as: BACTROBAN Apply thin film topically to open areas on penis 2 times a day until healed   Risa-Bid Probiotic Tabs Take 1 tablet by mouth 2 (two) times daily.   sacubitril-valsartan 49-51 MG Commonly known as: ENTRESTO Take 1 tablet by mouth 2 (two) times daily.            Durable Medical Equipment  (From admission, onward)         Start     Ordered   05/19/19 0000  For home use only DME lightweight manual wheelchair with seat cushion    Comments: Patient suffers from chronic CHF which impairs their ability to perform daily activities like toileting in the home.  A walker will not resolve  issue with performing activities of daily living. A wheelchair will allow patient to safely perform daily activities. Patient is not able to propel themselves in the home using a standard weight wheelchair due to general weakness. Patient can self propel in the lightweight wheelchair. Length of need Lifetime.  With back cushion Accessories: elevating leg rests (ELRs), wheel locks, extensions and anti-tippers.   05/19/19 1457         Follow-up Information    [name], MD. Go on 05/21/2019.   Specialty: Internal Medicine Why: 10:45appointment Contact information: [address] [place] [id number] [phone]            Time coordinating discharge: 32 minutes  Signed:  Jennye Boroughs  [facility] 05/19/2019,  2:57 PM

## 2019-05-19 NOTE — Progress Notes (Signed)
   05/19/19 1330  Clinical Encounter Type  Visited With Patient  Visit Type Follow-up  Referral From Patient  Consult/Referral To Chaplain  While rounding Chaplain briefly visited with patient, offering pastoral presence, empathy, prayer.

## 2019-05-19 NOTE — Clinical Social Work Note (Signed)
Patient suffers from chronic CHF which impairs their ability to perform daily activities like meal preparation, toileting, and home making in the home. A walker will not resolve issue with performing activities of daily living. A wheelchair will allow patient to safely perform daily activities. Patient is not able to propel themselves in the home using a standard weight wheelchair due to weakness and pain. Patient can self propel in a lightweight wheelchair.   Length of need: indefinite.   Accessories: elevating leg rests, wheel locks, extensions, anti tippers, back cushion.

## 2019-05-19 NOTE — TOC Transition Note (Signed)
Transition of Care [facility]) - CM/SW Discharge Note   Patient Details  Name: Jordan Kline MRN: [id number] Date of Birth: Jul 18, 1927  Transition of Care [facility]) CM/SW Contact:  Liliana Cline, LCSWA Phone Number: 05/19/2019, 3:01 PM   Clinical Narrative:   Patient has orders to discharge home today. Patient will need EMS transport home, confirmed address with wife. Wife requested a new wheelchair, stated his current one is 84 years old. Order being sent to [facility], Representative Brad notified. Wife was agreeable with wheelchair being delivered to the home. Home health services arranged with Christy Gentles at [facility]. Outpatient palliative services arranged with Dayna Barker at [facility]. No additional needs at this time. CSW signing off.     Final next level of care: Home w Home Health Services([facility]) Barriers to Discharge: Barriers Resolved   Patient Goals and CMS Choice   CMS Medicare.gov Compare Post Acute Care list provided to:: (Not provided, patient was active with [facility] already and wife wanted to continue services with them.)    Discharge Placement                Patient to be transferred to facility by: non emergent EMS Name of family member notified: wife- [name] Patient and family notified of of transfer: 05/19/19  Discharge Plan and Services   Discharge Planning Services: CM Consult Post Acute Care Choice: Home Health          DME Arranged: Lightweight manual wheelchair with seat cushion DME Agency: [facility] Date DME Agency Contacted: 05/19/19 Time DME Agency Contacted: 0230 Representative spoke with at DME Agency: [facility] Arranged: RN HH Agency: [facility] Date [facility] Agency Contacted: 05/19/19   Representative spoke with at [facility] Agency: [place]  Social Determinants of Health (SDOH) Interventions     Readmission Risk Interventions Readmission Risk Prevention Plan 05/13/2019  Transportation Screening Complete   PCP or Specialist Appt within 5-7 Days Complete  Home Care Screening Complete  Medication Review (RN CM) Not Complete  Med Review comments Not completed yet.  Some recent data might be hidden

## 2019-05-19 NOTE — TOC Progression Note (Signed)
Transition of Care [facility]) - Progression Note    Patient Details  Name: Joziyah Roblero MRN: [id number] Date of Birth: 1928/03/16  Transition of Care [facility]) CM/SW Contact  Margarito Liner, LCSW Phone Number: 05/19/2019, 12:05 PM  Clinical Narrative: Beverly Sessions representative that, per RN in progression meeting, patient will likely discharge home tomorrow. Patient is active with nursing.    Expected Discharge Plan: Home w Home Health Services Barriers to Discharge: Continued Medical Work up  Expected Discharge Plan and Services Expected Discharge Plan: Home w Home Health Services   Discharge Planning Services: CM Consult Post Acute Care Choice: Home Health Living arrangements for the past 2 months: Single Family Home                                       Social Determinants of Health (SDOH) Interventions    Readmission Risk Interventions Readmission Risk Prevention Plan 05/13/2019  Transportation Screening Complete  PCP or Specialist Appt within 5-7 Days Complete  Home Care Screening Complete  Medication Review (RN CM) Not Complete  Med Review comments Not completed yet.  Some recent data might be hidden

## 2019-06-16 ENCOUNTER — Other Ambulatory Visit: Payer: Self-pay

## 2019-06-16 ENCOUNTER — Other Ambulatory Visit: Payer: Medicare HMO | Admitting: Nurse Practitioner

## 2019-06-16 ENCOUNTER — Encounter: Payer: Self-pay | Admitting: Nurse Practitioner

## 2019-06-16 DIAGNOSIS — I639 Cerebral infarction, unspecified: Secondary | ICD-10-CM

## 2019-06-16 DIAGNOSIS — Z515 Encounter for palliative care: Secondary | ICD-10-CM

## 2019-06-16 NOTE — Progress Notes (Signed)
Therapist, nutritional Palliative Care Consult Note Telephone: [phone]  Fax: [phone]  PATIENT NAME: Jordan Kline DOB: 01/01/1928 MRN: [id number]  PRIMARY CARE PROVIDER:   Sherron Monday, MD  REFERRING PROVIDER:  Sherron Monday, MD [address] [place],  [place] [address]  RESPONSIBLE PARTY:RESPONSIBLE PARTY:Wife Jordan Kline [id number]  Due to the COVID-19 crisis, this visit was done via telemedicine from my office and it was initiated and consent by this patient and or family.  RECOMMENDATIONS and PLAN: 1.ACP: DNR in place with GOC to focus on comfort; plan to refer back to hospice when eligible.   2.Debility secondary to late onset CVA progressive, encourage passive rom;  3.Palliative care encounter;Palliative medicine team will continue to support patient, patient's family, and medical team. Visit consisted of counseling and education dealing with the complex and emotionally intense issues of symptom management and palliative care in the setting of serious and potentially life-threatening illness  Palliative care 9/27 / 2019 Palliative care 1/ 25 / 2019 to 2 / 15 / 2019 Hospice 2 / 17 / 2019 to 8 / 15 / 2019  I spent 40 minutes providing this consultation,  from 1:00pm to 1:40pm. More than 50% of the time in this consultation was spent coordinating communication.   HISTORY OF PRESENT ILLNESS:  Jordan Kline is a 86 y.o. year old male with multiple medical problems including Left-sided hemiparesis, atrial fibrillation, chronic kidney disease, hypertension, hyperlipidemia, bph, history of GI bleed, protein calorie malnutrition, inguinal hernia, appendectomy. Hospitalization 2 / 13 / 2021 to 2 / 22 / 2021 for altered mental status. Indwelling catheter replaced a few days prior to admission and noted to be hypotensive, tachycardic with work out significant for UTI e-coli sepsis bacteremia. Workup also significant for chronic  diastolic congestive heart failure with intermittent PVCs and non sustained VT, echo with EF estimated 50 to 55% hyponatremia with acute on chronic kidney disease stage 3 improve with fluids. Jordan Kline return back to baseline weaned off oxygen discharged home. Today follow-up palliative care telemedicine telephonic is video not available. I called Jordan Kline. We talked about purpose of palliative care visit. Jordan Kline in agreement. We talked about recent hospitalization. We talked about how Jordan Kline has been doing since he's returned home. Jordan Kline endorses that he is doing much better. Jordan. Scheier has continued to get short of breath with turning though improves with rest. Jordan Kline remains bed down, total ADL dependence. Jordan Kline endorses she was able with the help of family to get him in the transportation Zenaida Niece to take him to his Hospital follow-up visit. Jordan Bon endorses he tolerated trip better than she thought. We talked about symptoms a pain. We talked about appetite. Jordan Kline endorses he does continue to feed himself within a reasonable amount of time. His appetite continues to improve. No noted weight loss. We talked about wounds on his buttocks. We talked about previous Hospice Services Jordan Kline was under but discharge due to stability. We talked about Hospice Services at length. We talked about current clinical scenario and with good appetite though symptomatic with shortness of breath with turning and positioning. Jordan. Kline endorses she feels like Jordan Kline been doing okay. Jordan. Baeten endorses she does not feel Jordan Kline is back at the point where he would need Hospice Services. Discuss Services provided. We revisited Jordan Kline current clinical condition. Jordan Kline endorses if she does see a decline she will notify palliative care. We talked about  role of palliative care and plan of care. Discuss will follow closely and monitor with recent  hospitalization in 4 weeks if needed or sooner should decline. Will revisit hospice with a decline. Jordan Kline in agreement. Appointments scheduled. Questions answered to satisfaction. Contact information provided. Therapeutic listening and emotional support provided.  Palliative Care was asked to help to continue to address goals of care.   CODE STATUS: DNR  PPS: 30% HOSPICE ELIGIBILITY/DIAGNOSIS: TBD  PAST MEDICAL HISTORY:  Past Medical History:  Diagnosis Date  . A-fib ([place])    On Xarelto  . Chronic kidney disease (CKD)   . High cholesterol   . Hypertension   . Inguinal hernia   . Stroke [facility])    With left-sided hemiparesis  . Urinary retention    Chronic Foley catheter since December 2016    SOCIAL HX:  Social History   Tobacco Use  . Smoking status: Former Research scientist (life sciences)  . Smokeless tobacco: Never Used  . Tobacco comment: quit 50 years  Substance Use Topics  . Alcohol use: No    Alcohol/week: 0.0 standard drinks    ALLERGIES: No Known Allergies   PERTINENT MEDICATIONS:  Outpatient Encounter Medications as of 06/16/2019  Medication Sig  . acetaminophen (TYLENOL) 325 MG tablet Take 650 mg by mouth every 4 (four) hours as needed. for pain/ increased temp. May be administered orally, per G-tube if needed or rectally if unable to swallow (separate order). Maximum dose for 24 hours is 3,000 mg from all sources of Acetaminophen/ Tylenol  . Amino Acids-Protein Hydrolys (FEEDING SUPPLEMENT, PRO-STAT SUGAR FREE 64,) LIQD Take 30 mLs by mouth 2 (two) times daily between meals.  Marland Kitchen apixaban (ELIQUIS) 2.5 MG TABS tablet Take 1 tablet (2.5 mg total) by mouth 2 (two) times daily.  Marland Kitchen atorvastatin (LIPITOR) 80 MG tablet Take 80 mg by mouth every evening.   . carvedilol (COREG) 6.25 MG tablet Take 1 tablet (6.25 mg total) by mouth 2 (two) times daily.  Marland Kitchen diltiazem (CARDIZEM CD) 120 MG 24 hr capsule Take 1 capsule (120 mg total) by mouth every evening.  . docusate sodium (COLACE) 100 MG  capsule Take 100 mg by mouth 2 (two) times daily.   . ferrous sulfate 325 (65 FE) MG tablet Take 1 tablet (325 mg total) by mouth 2 (two) times daily with a meal.  . food thickener (THICK IT) POWD Take by mouth. Use to mix thin liquids to nectar thick consistency as needed  . Infant Care Products (DERMACLOUD) CREA Apply liberal amount topically to area of skin irritation prn. OK to leave at bedside.  . Multiple Vitamin (MULTIVITAMIN WITH MINERALS) TABS tablet Take 1 tablet by mouth daily.  . mupirocin cream (BACTROBAN) 2 % Apply thin film topically to open areas on penis 2 times a day until healed  . Nutritional Supplements (ENSURE ENLIVE PO) Take 1 Bottle by mouth 3 (three) times daily. For 8 oz bottle, add 2 tablespoons of thickner. Stir for 15 seconds until dissolved.  . Probiotic Product (RISA-BID PROBIOTIC) TABS Take 1 tablet by mouth 2 (two) times daily.  . sacubitril-valsartan (ENTRESTO) 49-51 MG Take 1 tablet by mouth 2 (two) times daily.   No facility-administered encounter medications on file as of 06/16/2019.    PHYSICAL EXAM:   Deferred  Lou Irigoyen Z Markitta Ausburn, NP

## 2019-07-15 ENCOUNTER — Encounter: Payer: Self-pay | Admitting: Nurse Practitioner

## 2019-07-15 ENCOUNTER — Other Ambulatory Visit: Payer: Medicare HMO | Admitting: Nurse Practitioner

## 2019-07-15 ENCOUNTER — Other Ambulatory Visit: Payer: Self-pay

## 2019-07-15 DIAGNOSIS — Z515 Encounter for palliative care: Secondary | ICD-10-CM

## 2019-07-15 DIAGNOSIS — I639 Cerebral infarction, unspecified: Secondary | ICD-10-CM

## 2019-07-15 NOTE — Progress Notes (Signed)
[place] Consult Note Telephone: [phone]  Fax: [phone]  PATIENT NAME: Jordan Kline DOB: 14-Sep-1927 MRN: [id number]  PRIMARY CARE PROVIDER:   [name], MD  REFERRING PROVIDER:  [name], MD [place],  [place] [address]   RESPONSIBLE PARTY:RESPONSIBLE PARTY:Wife Jordan Kline [id number]  Due to the COVID-19 crisis, this visit was done via telemedicine from my office and it was initiated and consent by this patient and or family.  RECOMMENDATIONS and PLAN: 1.ACP: DNR in place with GOC to focus on comfort; plan to refer back to hospice when eligible.   2.Debility secondary to late onset CVA progressive, encourage passive rom;  3.Palliative care encounter;Palliative medicine team will continue to support patient, patient's family, and medical team. Visit consisted of counseling and education dealing with the complex and emotionally intense issues of symptom management and palliative care in the setting of serious and potentially life-threatening illness  Palliative care 9/27 / 2019 Palliative care 1/ 25 / 2019 to 2 / 15 / 2019 Hospice 2 / 17 / 2019 to 8 / 15 / 2019  I spent 50 minutes providing this consultation,  from 1:00pm to 1:50pm. More than 50% of the time in this consultation was spent coordinating communication.   HISTORY OF PRESENT ILLNESS:  Jordan Kline is a 86 y.o. year old male with multiple medical problems including Left-sided hemiparesis, atrial fibrillation, chronic kidney disease, hypertension, hyperlipidemia, bph, history of GI bleed, protein calorie malnutrition, inguinal hernia, appendectomy. I called Mrs. Jordan Kline for [name] telemedicine telephonic is video not available follow-up palliative care visit. Mrs Jordan Kline I talked about purpose of palliative care visit. Mrs Jordan Kline in agreement. We talked about how Mr Jordan Kline has been doing. Mrs.  Jordan Kline and divorces that there has been no new changes. No recent hospitalization, wounds, infections, falls. Mrs. Jordan Kline endorses that his appetite remains about the same fair, with no notice weight loss. We talked about his current functional stability total care. We talked about chronic disease progression. We talked about previous Hospice Services that were provided but discharge due to stability. We talked about when to revisit hospice. At present time [name] does remain stable. We talked about the kind of exact scene. We talked about medical goals of care to focus on comfort with DNR in place. We talked about role of palliative care and plan of care. Discuss follow-up palliative care visit in two months if needed or sooner should he declined. Mrs Jordan Kline in agreement, appointment scheduled. We talked about caregiver fatigue and coping strategies. Therapeutic listening, emotional support provided. Contact information provided. Questions answer to satisfaction  Palliative Care was asked to help to continue to address goals of care.   CODE STATUS: DNR  PPS: 30% HOSPICE ELIGIBILITY/DIAGNOSIS: TBD  PAST MEDICAL HISTORY:  Past Medical History:  Diagnosis Date   A-fib ([place])    On Xarelto   Chronic kidney disease (CKD)    High cholesterol    Hypertension    Inguinal hernia    Stroke [facility])    With left-sided hemiparesis   Urinary retention    Chronic Foley catheter since December 2016    SOCIAL HX:  Social History   Tobacco Use   Smoking status: Former Smoker   Smokeless tobacco: Never Used   Tobacco comment: quit 50 years  Substance Use Topics   Alcohol use: No    Alcohol/week: 0.0 standard drinks    ALLERGIES: No Known Allergies   PERTINENT  MEDICATIONS:  Outpatient Encounter Medications as of 07/15/2019  Medication Sig   acetaminophen (TYLENOL) 325 MG tablet Take 650 mg by mouth every 4 (four) hours as needed. for pain/ increased temp. May be administered  orally, per G-tube if needed or rectally if unable to swallow (separate order). Maximum dose for 24 hours is 3,000 mg from all sources of Acetaminophen/ Tylenol   Amino Acids-Protein Hydrolys (FEEDING SUPPLEMENT, PRO-STAT SUGAR FREE 64,) LIQD Take 30 mLs by mouth 2 (two) times daily between meals.   apixaban (ELIQUIS) 2.5 MG TABS tablet Take 1 tablet (2.5 mg total) by mouth 2 (two) times daily.   atorvastatin (LIPITOR) 80 MG tablet Take 80 mg by mouth every evening.    carvedilol (COREG) 6.25 MG tablet Take 1 tablet (6.25 mg total) by mouth 2 (two) times daily.   diltiazem (CARDIZEM CD) 120 MG 24 hr capsule Take 1 capsule (120 mg total) by mouth every evening.   docusate sodium (COLACE) 100 MG capsule Take 100 mg by mouth 2 (two) times daily.    ferrous sulfate 325 (65 FE) MG tablet Take 1 tablet (325 mg total) by mouth 2 (two) times daily with a meal.   food thickener (THICK IT) POWD Take by mouth. Use to mix thin liquids to nectar thick consistency as needed   Infant Care Products (DERMACLOUD) CREA Apply liberal amount topically to area of skin irritation prn. OK to leave at bedside.   Multiple Vitamin (MULTIVITAMIN WITH MINERALS) TABS tablet Take 1 tablet by mouth daily.   mupirocin cream (BACTROBAN) 2 % Apply thin film topically to open areas on penis 2 times a day until healed   Nutritional Supplements (ENSURE ENLIVE PO) Take 1 Bottle by mouth 3 (three) times daily. For 8 oz bottle, add 2 tablespoons of thickner. Stir for 15 seconds until dissolved.   Probiotic Product (RISA-BID PROBIOTIC) TABS Take 1 tablet by mouth 2 (two) times daily.   sacubitril-valsartan (ENTRESTO) 49-51 MG Take 1 tablet by mouth 2 (two) times daily.   No facility-administered encounter medications on file as of 07/15/2019.    PHYSICAL EXAM:   Deferred  Asuzena Weis Z Ollin Hochmuth, NP

## 2019-09-02 ENCOUNTER — Telehealth: Payer: Self-pay | Admitting: Nurse Practitioner

## 2019-09-02 ENCOUNTER — Other Ambulatory Visit: Payer: Medicare HMO | Admitting: Nurse Practitioner

## 2019-09-02 ENCOUNTER — Other Ambulatory Visit: Payer: Self-pay

## 2019-09-02 NOTE — Telephone Encounter (Signed)
I called Jordan Kline to check prior to coming for in-home PC visit, Jordan Kline requested to reschedule. Rescheduled, no new complaints or concerns. Contact information provided

## 2019-10-07 ENCOUNTER — Other Ambulatory Visit: Payer: Medicare HMO | Admitting: Nurse Practitioner

## 2019-10-07 ENCOUNTER — Other Ambulatory Visit: Payer: Self-pay

## 2019-10-07 ENCOUNTER — Encounter: Payer: Self-pay | Admitting: Nurse Practitioner

## 2019-10-07 DIAGNOSIS — I639 Cerebral infarction, unspecified: Secondary | ICD-10-CM

## 2019-10-07 DIAGNOSIS — Z515 Encounter for palliative care: Secondary | ICD-10-CM

## 2019-10-07 NOTE — Progress Notes (Signed)
Therapist, nutritional Palliative Care Consult Note Telephone: [phone]  Fax: [phone]  PATIENT NAME: Jordan Kline DOB: 05-Nov-1927 MRN: [id number]  PRIMARY CARE PROVIDER:   Sherron Monday, MD  REFERRING PROVIDER:  Sherron Monday, MD [address] [place],  [place] [address] RESPONSIBLE PARTY:RESPONSIBLE PARTY:Wife Olanrewaju Osborn [phone]  Due to the COVID-19 crisis, this visit was done via telemedicine from my office and it was initiated and consent by this patient and or family.  RECOMMENDATIONS and PLAN: 1.ACP: DNR in place with GOC to focus on comfort; plan to refer back to hospice when eligible.  2.Debility secondary to late onset CVA progressive, encourage passive rom;  3.Palliative care encounter;Palliative medicine team will continue to support patient, patient's family, and medical team. Visit consisted of counseling and education dealing with the complex and emotionally intense issues of symptom management and palliative care in the setting of serious and potentially life-threatening illness  Palliative care 9/27 / 2019 Palliative care 1/ 25 / 2019 to 2 / 15 / 2019 Hospice 2 / 17 / 2019 to 8 / 15 / 2019  I spent 50 minutes providing this consultation,  from 1:00pm to 1:50pm More than 50% of the time in this consultation was spent coordinating communication.   HISTORY OF PRESENT ILLNESS:  Jordan Kline is a 86 y.o. year old male with multiple medical problems including Left-sided hemiparesis, atrial fibrillation, chronic kidney disease, hypertension, hyperlipidemia, bph, history of GI bleed, protein calorie malnutrition, inguinal hernia, appendectomy. I called Jordan Kline for scheduled in-person palliative care follow-up visit confirmation. Jordan. Kline requested to change visit to a telemedicine as she has been having a slower morning than expected. We talked about telemedicine visit by phone as video not available.  Discuss that will continue telemedicine visit at scheduled time today. We talked about purpose of palliative care visit. We talked about how Jordan Kline has been doing. Jordan Kline continues to be bed-bound in a hospital bed requiring assistance for transfers and positioning. Jordan. Kline does provide all adl's. Jordan. Kline has continued to be incontinent. Jordan. Kline is able to feed himself but some days it takes more promising than others. Jordan Kline endorses Jordan Kline does have a fairly good appetite. Jordan. Kline endorses that he is a small frame man and has lost weight since his stroke but does not appear to be further weight loss. We talked about when Jordan Kline was on hospice. No recent falls, infections, hospitalizations. Jordan Kline and I talked about hospice and at this point in time did not feel like Jordan Kline was at that point again. We talked about medical goals of care. We talked about symptoms as presently Jordan. Kline appears comfortable. Jordan. Kline endorses that he does seem to sleep all night. During the day Jordan Kline watch TV and is able to have a conversation with her. We talked about quality of life. We talked about coping strategies. We talked about caregiver fatigue in the importance of self-care. Jordan. Kline talked about having family that does help and support her. Sometimes they will come over so she could run errands. We talked about follow up palliative care visit in 8 weeks if needed or sooner if declines. Jordan Kline in agreement. Appointment scheduled. Therapeutic listening and emotional support provided. Questions answered is satisfaction.  Palliative Care was asked to help to continue to address goals of care.   CODE STATUS: DNR  PPS: 30% HOSPICE ELIGIBILITY/DIAGNOSIS: TBD  PAST MEDICAL HISTORY:  Past  Medical History:  Diagnosis Date  . A-fib (HCC)    On Xarelto  . Chronic kidney disease (CKD)   . High cholesterol   . Hypertension   . Inguinal  hernia   . Stroke [facility])    With left-sided hemiparesis  . Urinary retention    Chronic Foley catheter since December 2016    SOCIAL HX:  Social History   Tobacco Use  . Smoking status: Former Games developer  . Smokeless tobacco: Never Used  . Tobacco comment: quit 50 years  Substance Use Topics  . Alcohol use: No    Alcohol/week: 0.0 standard drinks    ALLERGIES: No Known Allergies   PERTINENT MEDICATIONS:  Outpatient Encounter Medications as of 10/07/2019  Medication Sig  . acetaminophen (TYLENOL) 325 MG tablet Take 650 mg by mouth every 4 (four) hours as needed. for pain/ increased temp. May be administered orally, per G-tube if needed or rectally if unable to swallow (separate order). Maximum dose for 24 hours is 3,000 mg from all sources of Acetaminophen/ Tylenol  . Amino Acids-Protein Hydrolys (FEEDING SUPPLEMENT, PRO-STAT SUGAR FREE 64,) LIQD Take 30 mLs by mouth 2 (two) times daily between meals.  Marland Kitchen apixaban (ELIQUIS) 2.5 MG TABS tablet Take 1 tablet (2.5 mg total) by mouth 2 (two) times daily.  Marland Kitchen atorvastatin (LIPITOR) 80 MG tablet Take 80 mg by mouth every evening.   . carvedilol (COREG) 6.25 MG tablet Take 1 tablet (6.25 mg total) by mouth 2 (two) times daily.  Marland Kitchen diltiazem (CARDIZEM CD) 120 MG 24 hr capsule Take 1 capsule (120 mg total) by mouth every evening.  . docusate sodium (COLACE) 100 MG capsule Take 100 mg by mouth 2 (two) times daily.   . ferrous sulfate 325 (65 FE) MG tablet Take 1 tablet (325 mg total) by mouth 2 (two) times daily with a meal.  . food thickener (THICK IT) POWD Take by mouth. Use to mix thin liquids to nectar thick consistency as needed  . Infant Care Products (DERMACLOUD) CREA Apply liberal amount topically to area of skin irritation prn. OK to leave at bedside.  . Multiple Vitamin (MULTIVITAMIN WITH MINERALS) TABS tablet Take 1 tablet by mouth daily.  . mupirocin cream (BACTROBAN) 2 % Apply thin film topically to open areas on penis 2 times a day until  healed  . Nutritional Supplements (ENSURE ENLIVE PO) Take 1 Bottle by mouth 3 (three) times daily. For 8 oz bottle, add 2 tablespoons of thickner. Stir for 15 seconds until dissolved.  . Probiotic Product (RISA-BID PROBIOTIC) TABS Take 1 tablet by mouth 2 (two) times daily.  . sacubitril-valsartan (ENTRESTO) 49-51 MG Take 1 tablet by mouth 2 (two) times daily.   No facility-administered encounter medications on file as of 10/07/2019.    PHYSICAL EXAM:  Deferred  Caralee Morea Z Cleona Doubleday, NP

## 2019-10-23 ENCOUNTER — Telehealth: Payer: Self-pay | Admitting: Nurse Practitioner

## 2019-10-23 NOTE — Telephone Encounter (Signed)
I called Jordan Kline to confirm f/u PC visit, when placed in schedule, did not take, no answer, message left with contact information to return call

## 2020-01-05 ENCOUNTER — Other Ambulatory Visit: Payer: Self-pay

## 2020-01-05 ENCOUNTER — Encounter: Payer: Self-pay | Admitting: Nurse Practitioner

## 2020-01-05 ENCOUNTER — Other Ambulatory Visit: Payer: Medicare HMO | Admitting: Nurse Practitioner

## 2020-01-05 DIAGNOSIS — Z515 Encounter for palliative care: Secondary | ICD-10-CM

## 2020-01-05 DIAGNOSIS — I639 Cerebral infarction, unspecified: Secondary | ICD-10-CM

## 2020-01-05 NOTE — Progress Notes (Signed)
Therapist, nutritional Palliative Care Consult Note Telephone: [phone]  Fax: [phone]  PATIENT NAME: Jordan Kline DOB: 10-13-1927 MRN: [id number]  PRIMARY CARE PROVIDER:   Sherron Monday, MD  REFERRING PROVIDER:  Sherron Monday, MD [address] [place],  [place] [address]  RESPONSIBLE PARTY:RESPONSIBLE PARTY:Wife Antavion Bartoszek [phone]  Due to the COVID-19 crisis, this visit was done via telemedicine from my office and it was initiated and consent by this patient and or family.  RECOMMENDATIONS and PLAN: 1.ACP: DNR in place with GOC to focus on comfort; plan to refer back to hospice when eligible.  2.Debility secondary to late onset CVA progressive, encourage passive rom;  3.Palliative care encounter;Palliative medicine team will continue to support patient, patient's family, and medical team. Visit consisted of counseling and education dealing with the complex and emotionally intense issues of symptom management and palliative care in the setting of serious and potentially life-threatening illness  Palliative care 9/27 / 2019 Palliative care 1/ 25 / 2019 to 2 / 15 / 2019 Hospice 2 / 17 / 2019 to 8 / 15 / 2019  4. F/u 2 months for ongoing monitoring decline, weight, disease progression  I spent 60 minutes providing this consultation,  from 12:00pm to 1:00pm. More than 50% of the time in this consultation was spent coordinating communication.   HISTORY OF PRESENT ILLNESS:  Soma Lizak is a 86 y.o. year old male with multiple medical problems including Left-sided hemiparesis, atrial fibrillation, chronic kidney disease, hypertension, hyperlipidemia, bph, history of GI bleed, protein calorie malnutrition, inguinal hernia, appendectomy. I called Mrs Cunnington for in-person palliative care visit confirmation. Mrs Giovanelli requested that it be changed to telemedicine telephonic is video not available. Honored Mrs Touch's  request and proceeded with palliative care visit. We talked about Mrs Abebe, caregiver fatigue and stress. Mrs Detweiler endorses that she is actually having dental surgery and dealing with her own health issues. Mrs. Loiseau endorses Mr. Huskins has been stable, doing well though appetite still remains declined. Mrs Holub endorses that she feels like Mr. Suhr may have lost a little bit of weight but not back at the point where he would need Hospice Services. 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 Mr Foglio is had Hospice Services in the past though discharge due to stability. Mrs Melvin endorses Mr. Laurent remains total ADL dependent. They have their daily routine and he remains bed bath, but she turns him frequently. Mr Speranza continues to have a red area sacrum stage one wounds that reoccurs after she gets it healed. Mrs Latulippe continues to work on the wound and at this point it is closed with red area. We talked about nutrition. We reviewed medical goals of care with focus on comfort. We talked about role of palliative care and plan of care. We talked about next Palliative care visit in two months if needed or sooner should he decline. Mrs Masoud in agreement appointment scheduled. Therapeutic listening, emotional support provided. Contact information.  Palliative Care was asked to help to continue to address goals of care.   CODE STATUS: DNR  PPS: 30% HOSPICE ELIGIBILITY/DIAGNOSIS: TBD  PAST MEDICAL HISTORY:  Past Medical History:  Diagnosis Date  . A-fib (HCC)    On Xarelto  . Chronic kidney disease (CKD)   . High cholesterol   . Hypertension   . Inguinal hernia   . Stroke [facility])    With left-sided hemiparesis  . Urinary retention    Chronic Foley catheter since December  2016    SOCIAL HX:  Social History   Tobacco Use  . Smoking status: Former Games developer  . Smokeless tobacco: Never Used  . Tobacco comment: quit 50 years  Substance Use Topics  . Alcohol use: No     Alcohol/week: 0.0 standard drinks    ALLERGIES: No Known Allergies   PERTINENT MEDICATIONS:  Outpatient Encounter Medications as of 10/84/2021  Medication Sig  . acetaminophen (TYLENOL) 325 MG tablet Take 650 mg by mouth every 4 (four) hours as needed. for pain/ increased temp. May be administered orally, per G-tube if needed or rectally if unable to swallow (separate order). Maximum dose for 24 hours is 3,000 mg from all sources of Acetaminophen/ Tylenol  . Amino Acids-Protein Hydrolys (FEEDING SUPPLEMENT, PRO-STAT SUGAR FREE 64,) LIQD Take 30 mLs by mouth 2 (two) times daily between meals.  Marland Kitchen apixaban (ELIQUIS) 2.5 MG TABS tablet Take 1 tablet (2.5 mg total) by mouth 2 (two) times daily.  Marland Kitchen atorvastatin (LIPITOR) 80 MG tablet Take 80 mg by mouth every evening.   . carvedilol (COREG) 6.25 MG tablet Take 1 tablet (6.25 mg total) by mouth 2 (two) times daily.  Marland Kitchen diltiazem (CARDIZEM CD) 120 MG 24 hr capsule Take 1 capsule (120 mg total) by mouth every evening.  . docusate sodium (COLACE) 100 MG capsule Take 100 mg by mouth 2 (two) times daily.   . ferrous sulfate 325 (65 FE) MG tablet Take 1 tablet (325 mg total) by mouth 2 (two) times daily with a meal.  . food thickener (THICK IT) POWD Take by mouth. Use to mix thin liquids to nectar thick consistency as needed  . Infant Care Products (DERMACLOUD) CREA Apply liberal amount topically to area of skin irritation prn. OK to leave at bedside.  . Multiple Vitamin (MULTIVITAMIN WITH MINERALS) TABS tablet Take 1 tablet by mouth daily.  . mupirocin cream (BACTROBAN) 2 % Apply thin film topically to open areas on penis 2 times a day until healed  . Nutritional Supplements (ENSURE ENLIVE PO) Take 1 Bottle by mouth 3 (three) times daily. For 8 oz bottle, add 2 tablespoons of thickner. Stir for 15 seconds until dissolved.  . Probiotic Product (RISA-BID PROBIOTIC) TABS Take 1 tablet by mouth 2 (two) times daily.  . sacubitril-valsartan (ENTRESTO) 49-51 MG  Take 1 tablet by mouth 2 (two) times daily.   No facility-administered encounter medications on file as of 01/05/2020.    PHYSICAL EXAM:   Deferred  Derrica Sieg Z Elexis Pollak, NP

## 2020-03-11 ENCOUNTER — Other Ambulatory Visit: Payer: Medicare HMO | Admitting: Nurse Practitioner

## 2020-04-09 ENCOUNTER — Other Ambulatory Visit: Payer: Self-pay

## 2020-04-09 ENCOUNTER — Inpatient Hospital Stay: Payer: HMO

## 2020-04-09 ENCOUNTER — Emergency Department: Payer: HMO

## 2020-04-09 ENCOUNTER — Inpatient Hospital Stay
Admission: EM | Admit: 2020-04-09 | Discharge: 2020-04-27 | DRG: 871 | Disposition: E | Payer: HMO | Attending: Pulmonary Disease | Admitting: Pulmonary Disease

## 2020-04-09 DIAGNOSIS — E875 Hyperkalemia: Secondary | ICD-10-CM | POA: Diagnosis present

## 2020-04-09 DIAGNOSIS — R7881 Bacteremia: Secondary | ICD-10-CM | POA: Diagnosis not present

## 2020-04-09 DIAGNOSIS — Z87891 Personal history of nicotine dependence: Secondary | ICD-10-CM

## 2020-04-09 DIAGNOSIS — D631 Anemia in chronic kidney disease: Secondary | ICD-10-CM | POA: Diagnosis present

## 2020-04-09 DIAGNOSIS — N390 Urinary tract infection, site not specified: Secondary | ICD-10-CM | POA: Diagnosis present

## 2020-04-09 DIAGNOSIS — A4151 Sepsis due to Escherichia coli [E. coli]: Principal | ICD-10-CM | POA: Diagnosis present

## 2020-04-09 DIAGNOSIS — D696 Thrombocytopenia, unspecified: Secondary | ICD-10-CM | POA: Diagnosis present

## 2020-04-09 DIAGNOSIS — I4891 Unspecified atrial fibrillation: Secondary | ICD-10-CM | POA: Diagnosis not present

## 2020-04-09 DIAGNOSIS — Y95 Nosocomial condition: Secondary | ICD-10-CM | POA: Diagnosis present

## 2020-04-09 DIAGNOSIS — N1832 Chronic kidney disease, stage 3b: Secondary | ICD-10-CM | POA: Diagnosis present

## 2020-04-09 DIAGNOSIS — I639 Cerebral infarction, unspecified: Secondary | ICD-10-CM | POA: Diagnosis present

## 2020-04-09 DIAGNOSIS — E785 Hyperlipidemia, unspecified: Secondary | ICD-10-CM | POA: Diagnosis present

## 2020-04-09 DIAGNOSIS — N179 Acute kidney failure, unspecified: Secondary | ICD-10-CM | POA: Diagnosis present

## 2020-04-09 DIAGNOSIS — E861 Hypovolemia: Secondary | ICD-10-CM | POA: Diagnosis present

## 2020-04-09 DIAGNOSIS — J9601 Acute respiratory failure with hypoxia: Secondary | ICD-10-CM | POA: Diagnosis present

## 2020-04-09 DIAGNOSIS — Z66 Do not resuscitate: Secondary | ICD-10-CM | POA: Diagnosis present

## 2020-04-09 DIAGNOSIS — Z79899 Other long term (current) drug therapy: Secondary | ICD-10-CM

## 2020-04-09 DIAGNOSIS — R4020 Unspecified coma: Secondary | ICD-10-CM | POA: Diagnosis present

## 2020-04-09 DIAGNOSIS — Z515 Encounter for palliative care: Secondary | ICD-10-CM

## 2020-04-09 DIAGNOSIS — J189 Pneumonia, unspecified organism: Secondary | ICD-10-CM | POA: Diagnosis present

## 2020-04-09 DIAGNOSIS — I69354 Hemiplegia and hemiparesis following cerebral infarction affecting left non-dominant side: Secondary | ICD-10-CM | POA: Diagnosis not present

## 2020-04-09 DIAGNOSIS — R6521 Severe sepsis with septic shock: Secondary | ICD-10-CM | POA: Diagnosis present

## 2020-04-09 DIAGNOSIS — A419 Sepsis, unspecified organism: Secondary | ICD-10-CM | POA: Diagnosis present

## 2020-04-09 DIAGNOSIS — Z20822 Contact with and (suspected) exposure to covid-19: Secondary | ICD-10-CM | POA: Diagnosis present

## 2020-04-09 DIAGNOSIS — Z7901 Long term (current) use of anticoagulants: Secondary | ICD-10-CM | POA: Diagnosis not present

## 2020-04-09 DIAGNOSIS — J9600 Acute respiratory failure, unspecified whether with hypoxia or hypercapnia: Secondary | ICD-10-CM

## 2020-04-09 DIAGNOSIS — Z7401 Bed confinement status: Secondary | ICD-10-CM

## 2020-04-09 DIAGNOSIS — I4821 Permanent atrial fibrillation: Secondary | ICD-10-CM | POA: Diagnosis present

## 2020-04-09 DIAGNOSIS — R6520 Severe sepsis without septic shock: Secondary | ICD-10-CM | POA: Diagnosis not present

## 2020-04-09 DIAGNOSIS — D509 Iron deficiency anemia, unspecified: Secondary | ICD-10-CM | POA: Diagnosis present

## 2020-04-09 DIAGNOSIS — I130 Hypertensive heart and chronic kidney disease with heart failure and stage 1 through stage 4 chronic kidney disease, or unspecified chronic kidney disease: Secondary | ICD-10-CM | POA: Diagnosis present

## 2020-04-09 DIAGNOSIS — I509 Heart failure, unspecified: Secondary | ICD-10-CM | POA: Diagnosis not present

## 2020-04-09 DIAGNOSIS — I10 Essential (primary) hypertension: Secondary | ICD-10-CM | POA: Diagnosis not present

## 2020-04-09 DIAGNOSIS — J9602 Acute respiratory failure with hypercapnia: Secondary | ICD-10-CM | POA: Diagnosis present

## 2020-04-09 DIAGNOSIS — Z87440 Personal history of urinary (tract) infections: Secondary | ICD-10-CM

## 2020-04-09 DIAGNOSIS — Z538 Procedure and treatment not carried out for other reasons: Secondary | ICD-10-CM | POA: Diagnosis not present

## 2020-04-09 DIAGNOSIS — B9620 Unspecified Escherichia coli [E. coli] as the cause of diseases classified elsewhere: Secondary | ICD-10-CM

## 2020-04-09 DIAGNOSIS — Z8249 Family history of ischemic heart disease and other diseases of the circulatory system: Secondary | ICD-10-CM | POA: Diagnosis not present

## 2020-04-09 DIAGNOSIS — Z7189 Other specified counseling: Secondary | ICD-10-CM | POA: Diagnosis not present

## 2020-04-09 DIAGNOSIS — I5033 Acute on chronic diastolic (congestive) heart failure: Secondary | ICD-10-CM | POA: Diagnosis present

## 2020-04-09 LAB — BLOOD CULTURE ID PANEL (REFLEXED) - BCID2

## 2020-04-09 LAB — URINALYSIS, COMPLETE (UACMP) WITH MICROSCOPIC
Bilirubin Urine: NEGATIVE
Glucose, UA: NEGATIVE mg/dL
Ketones, ur: NEGATIVE mg/dL
Nitrite: NEGATIVE
Protein, ur: 100 mg/dL — AB
Specific Gravity, Urine: 1.014 (ref 1.005–1.030)
WBC, UA: >50 WBC/hpf — ABNORMAL HIGH (ref 0–5)
pH: 5 (ref 5.0–8.0)

## 2020-04-09 LAB — COMPREHENSIVE METABOLIC PANEL
ALT: 20 U/L (ref 0–44)
AST: 28 U/L (ref 15–41)
Albumin: 2.8 g/dL — ABNORMAL LOW (ref 3.5–5.0)
Alkaline Phosphatase: 84 U/L (ref 38–126)
Anion gap: 11 (ref 5–15)
BUN: 69 mg/dL — ABNORMAL HIGH (ref 8–23)
CO2: 22 mmol/L (ref 22–32)
Calcium: 8.7 mg/dL — ABNORMAL LOW (ref 8.9–10.3)
Chloride: 107 mmol/L (ref 98–111)
Creatinine, Ser: 2.98 mg/dL — ABNORMAL HIGH (ref 0.61–1.24)
GFR, Estimated: 19 mL/min — ABNORMAL LOW (ref >60)
Glucose, Bld: 124 mg/dL — ABNORMAL HIGH (ref 70–99)
Potassium: 4.3 mmol/L (ref 3.5–5.1)
Sodium: 140 mmol/L (ref 135–145)
Total Bilirubin: 0.7 mg/dL (ref 0.3–1.2)
Total Protein: 5.7 g/dL — ABNORMAL LOW (ref 6.5–8.1)

## 2020-04-09 LAB — CBC WITH DIFFERENTIAL/PLATELET
Abs Immature Granulocytes: 0.02 10*3/uL (ref 0.00–0.07)
Basophils Absolute: 0 10*3/uL (ref 0.0–0.1)
Basophils Relative: 0 %
Eosinophils Absolute: 0 10*3/uL (ref 0.0–0.5)
Eosinophils Relative: 0 %
HCT: 34 % — ABNORMAL LOW (ref 39.0–52.0)
Hemoglobin: 10.8 g/dL — ABNORMAL LOW (ref 13.0–17.0)
Immature Granulocytes: 0 %
Lymphocytes Relative: 7 %
Lymphs Abs: 0.4 10*3/uL — ABNORMAL LOW (ref 0.7–4.0)
MCH: 32.2 pg (ref 26.0–34.0)
MCHC: 31.8 g/dL (ref 30.0–36.0)
MCV: 101.5 fL — ABNORMAL HIGH (ref 80.0–100.0)
Monocytes Absolute: 0.2 10*3/uL (ref 0.1–1.0)
Monocytes Relative: 3 %
Neutro Abs: 5.2 10*3/uL (ref 1.7–7.7)
Neutrophils Relative %: 90 %
Platelets: 107 10*3/uL — ABNORMAL LOW (ref 150–400)
RBC: 3.35 MIL/uL — ABNORMAL LOW (ref 4.22–5.81)
RDW: 14.6 % (ref 11.5–15.5)
Smear Review: NORMAL
WBC: 5.8 10*3/uL (ref 4.0–10.5)
nRBC: 0 % (ref 0.0–0.2)

## 2020-04-09 LAB — PROCALCITONIN: Procalcitonin: 11.76 ng/mL

## 2020-04-09 LAB — TYPE AND SCREEN
ABO/RH(D): O POS
Antibody Screen: NEGATIVE

## 2020-04-09 LAB — APTT: aPTT: 28 seconds (ref 24–36)

## 2020-04-09 LAB — RESP PANEL BY RT-PCR (FLU A&B, COVID) ARPGX2
Influenza A by PCR: NEGATIVE
Influenza B by PCR: NEGATIVE
SARS Coronavirus 2 by RT PCR: NEGATIVE

## 2020-04-09 LAB — MRSA PCR SCREENING: MRSA by PCR: NEGATIVE

## 2020-04-09 LAB — LACTIC ACID, PLASMA
Lactic Acid, Venous: 3.2 mmol/L (ref 0.5–1.9)
Lactic Acid, Venous: 3.9 mmol/L (ref 0.5–1.9)
Lactic Acid, Venous: 4 mmol/L (ref 0.5–1.9)

## 2020-04-09 LAB — BRAIN NATRIURETIC PEPTIDE: B Natriuretic Peptide: 2221.4 pg/mL — ABNORMAL HIGH (ref 0.0–100.0)

## 2020-04-09 LAB — PROTIME-INR
INR: 1.2 (ref 0.8–1.2)
Prothrombin Time: 14.7 seconds (ref 11.4–15.2)

## 2020-04-09 LAB — TROPONIN I (HIGH SENSITIVITY): Troponin I (High Sensitivity): 95 ng/L — ABNORMAL HIGH (ref <18)

## 2020-04-09 LAB — STREP PNEUMONIAE URINARY ANTIGEN: Strep Pneumo Urinary Antigen: NEGATIVE

## 2020-04-09 MED ORDER — LACTATED RINGERS IV BOLUS (SEPSIS): 500.0000 mL | Freq: Once | INTRAVENOUS | Status: DC

## 2020-04-09 MED ORDER — SODIUM CHLORIDE 0.9 % IV SOLN
250.0000 mL | INTRAVENOUS | Status: DC
  Administered 2020-04-10: 250 mL via INTRAVENOUS

## 2020-04-09 MED ORDER — LACTATED RINGERS IV BOLUS
500.0000 mL | Freq: Once | INTRAVENOUS | Status: AC
  Administered 2020-04-09: 500 mL via INTRAVENOUS

## 2020-04-09 MED ORDER — METRONIDAZOLE IN NACL 5-0.79 MG/ML-% IV SOLN
500.0000 mg | Freq: Three times a day (TID) | INTRAVENOUS | Status: DC
  Administered 2020-04-09 – 2020-04-10 (×2): 500 mg via INTRAVENOUS
  Filled 2020-04-09 (×2): qty 100

## 2020-04-09 MED ORDER — ONDANSETRON HCL 4 MG/2ML IJ SOLN
4.0000 mg | Freq: Once | INTRAMUSCULAR | Status: AC
  Administered 2020-04-09: 4 mg via INTRAVENOUS
  Filled 2020-04-09: qty 2

## 2020-04-09 MED ORDER — MORPHINE SULFATE (PF) 2 MG/ML IV SOLN
INTRAVENOUS | Status: AC
  Administered 2020-04-09: 0.5 mg via INTRAVENOUS
  Filled 2020-04-09: qty 1

## 2020-04-09 MED ORDER — MIDAZOLAM HCL 2 MG/2ML IJ SOLN
0.5000 mg | Freq: Once | INTRAMUSCULAR | Status: AC
  Administered 2020-04-09: 0.5 mg via INTRAVENOUS

## 2020-04-09 MED ORDER — PHENYLEPHRINE CONCENTRATED 100MG/250ML (0.4 MG/ML) INFUSION SIMPLE
0.0000 ug/min | INTRAVENOUS | Status: DC
  Administered 2020-04-09: 20 ug/min via INTRAVENOUS
  Administered 2020-04-10: 200 ug/min via INTRAVENOUS
  Filled 2020-04-09 (×2): qty 250

## 2020-04-09 MED ORDER — STARCH (THICKENING) PO POWD
ORAL | Status: DC | PRN
  Filled 2020-04-09: qty 227

## 2020-04-09 MED ORDER — NOREPINEPHRINE 4 MG/250ML-% IV SOLN
2.0000 ug/min | INTRAVENOUS | Status: DC
  Filled 2020-04-09: qty 250

## 2020-04-09 MED ORDER — NOREPINEPHRINE 16 MG/250ML-% IV SOLN
0.0000 ug/min | INTRAVENOUS | Status: DC
  Administered 2020-04-09: 30 ug/min via INTRAVENOUS
  Administered 2020-04-09: 28 ug/min via INTRAVENOUS
  Filled 2020-04-09 (×3): qty 250

## 2020-04-09 MED ORDER — ACETAMINOPHEN 650 MG RE SUPP: 650.0000 mg | Freq: Four times a day (QID) | RECTAL | Status: DC | PRN

## 2020-04-09 MED ORDER — LACTATED RINGERS IV BOLUS (SEPSIS): 250.0000 mL | Freq: Once | INTRAVENOUS | Status: DC

## 2020-04-09 MED ORDER — ACETAMINOPHEN 325 MG PO TABS: 650.0000 mg | ORAL_TABLET | Freq: Four times a day (QID) | ORAL | Status: DC | PRN

## 2020-04-09 MED ORDER — FERROUS SULFATE 325 (65 FE) MG PO TABS
325.0000 mg | ORAL_TABLET | Freq: Two times a day (BID) | ORAL | Status: DC
  Filled 2020-04-09 (×3): qty 1

## 2020-04-09 MED ORDER — ENSURE ENLIVE PO LIQD: 237.0000 mL | Freq: Two times a day (BID) | ORAL | Status: DC

## 2020-04-09 MED ORDER — IPRATROPIUM-ALBUTEROL 0.5-2.5 (3) MG/3ML IN SOLN
3.0000 mL | RESPIRATORY_TRACT | Status: DC
  Administered 2020-04-09 – 2020-04-10 (×6): 3 mL via RESPIRATORY_TRACT
  Filled 2020-04-09 (×6): qty 3

## 2020-04-09 MED ORDER — SODIUM CHLORIDE 0.9 % IV SOLN
500.0000 mg | Freq: Once | INTRAVENOUS | Status: AC
  Administered 2020-04-09: 500 mg via INTRAVENOUS
  Filled 2020-04-09: qty 500

## 2020-04-09 MED ORDER — VANCOMYCIN HCL 750 MG/150ML IV SOLN: 750.0000 mg | INTRAVENOUS | Status: DC

## 2020-04-09 MED ORDER — SODIUM CHLORIDE 0.9 % IV SOLN
2.0000 g | INTRAVENOUS | Status: DC
  Administered 2020-04-10: 2 g via INTRAVENOUS
  Filled 2020-04-09: qty 20

## 2020-04-09 MED ORDER — NOREPINEPHRINE 4 MG/250ML-% IV SOLN: 0.0000 ug/min | INTRAVENOUS | Status: DC

## 2020-04-09 MED ORDER — ADULT MULTIVITAMIN W/MINERALS CH: 1.0000 | ORAL_TABLET | Freq: Every day | ORAL | Status: DC

## 2020-04-09 MED ORDER — VASOPRESSIN 20 UNITS/100 ML INFUSION FOR SHOCK
0.0000 [IU]/min | INTRAVENOUS | Status: DC
  Administered 2020-04-09 – 2020-04-10 (×3): 0.03 [IU]/min via INTRAVENOUS
  Filled 2020-04-09 (×4): qty 100

## 2020-04-09 MED ORDER — HEPARIN SODIUM (PORCINE) 5000 UNIT/ML IJ SOLN
5000.0000 [IU] | Freq: Three times a day (TID) | INTRAMUSCULAR | Status: DC
  Administered 2020-04-09 – 2020-04-10 (×3): 5000 [IU] via SUBCUTANEOUS
  Filled 2020-04-09 (×3): qty 1

## 2020-04-09 MED ORDER — DM-GUAIFENESIN ER 30-600 MG PO TB12: 1.0000 | ORAL_TABLET | Freq: Two times a day (BID) | ORAL | Status: DC | PRN

## 2020-04-09 MED ORDER — VANCOMYCIN HCL IN DEXTROSE 1-5 GM/200ML-% IV SOLN
1000.0000 mg | Freq: Once | INTRAVENOUS | Status: AC
  Administered 2020-04-09: 1000 mg via INTRAVENOUS
  Filled 2020-04-09: qty 200

## 2020-04-09 MED ORDER — ACETAMINOPHEN 650 MG RE SUPP
650.0000 mg | Freq: Once | RECTAL | Status: AC
  Administered 2020-04-09: 650 mg via RECTAL
  Filled 2020-04-09: qty 1

## 2020-04-09 MED ORDER — LACTATED RINGERS IV SOLN: INTRAVENOUS | Status: DC

## 2020-04-09 MED ORDER — MIDAZOLAM HCL 2 MG/2ML IJ SOLN
INTRAMUSCULAR | Status: AC
  Filled 2020-04-09: qty 2

## 2020-04-09 MED ORDER — LACTATED RINGERS IV BOLUS (SEPSIS)
1000.0000 mL | Freq: Once | INTRAVENOUS | Status: AC
  Administered 2020-04-09: 1000 mL via INTRAVENOUS

## 2020-04-09 MED ORDER — NOREPINEPHRINE 4 MG/250ML-% IV SOLN
INTRAVENOUS | Status: AC
  Administered 2020-04-09: 2 ug/min via INTRAVENOUS
  Filled 2020-04-09: qty 250

## 2020-04-09 MED ORDER — HYDROCORTISONE NA SUCCINATE PF 100 MG IJ SOLR
50.0000 mg | Freq: Four times a day (QID) | INTRAMUSCULAR | Status: DC
  Administered 2020-04-09 – 2020-04-10 (×5): 50 mg via INTRAVENOUS
  Filled 2020-04-09 (×5): qty 2

## 2020-04-09 MED ORDER — MORPHINE SULFATE (PF) 2 MG/ML IV SOLN
0.5000 mg | INTRAVENOUS | Status: DC | PRN
  Administered 2020-04-10: 0.5 mg via INTRAVENOUS
  Filled 2020-04-09: qty 1

## 2020-04-09 MED ORDER — LACTATED RINGERS IV BOLUS
1000.0000 mL | Freq: Once | INTRAVENOUS | Status: AC
  Administered 2020-04-09: 1000 mL via INTRAVENOUS

## 2020-04-09 MED ORDER — SODIUM CHLORIDE 0.9 % IV BOLUS
250.0000 mL | Freq: Once | INTRAVENOUS | Status: AC
  Administered 2020-04-09: 250 mL via INTRAVENOUS

## 2020-04-09 MED ORDER — ALBUTEROL SULFATE (2.5 MG/3ML) 0.083% IN NEBU: 2.5000 mg | INHALATION_SOLUTION | RESPIRATORY_TRACT | Status: DC | PRN

## 2020-04-09 MED ORDER — ATORVASTATIN CALCIUM 20 MG PO TABS: 80.0000 mg | ORAL_TABLET | Freq: Every evening | ORAL | Status: DC

## 2020-04-09 MED ORDER — PRO-STAT SUGAR FREE PO LIQD: 30.0000 mL | Freq: Two times a day (BID) | ORAL | Status: DC

## 2020-04-09 MED ORDER — LACTATED RINGERS IV SOLN: INTRAVENOUS | Status: AC

## 2020-04-09 MED ORDER — ONDANSETRON HCL 4 MG/2ML IJ SOLN: 4.0000 mg | Freq: Three times a day (TID) | INTRAMUSCULAR | Status: DC | PRN

## 2020-04-09 MED ORDER — SODIUM CHLORIDE 0.9 % IV SOLN: 2.0000 g | INTRAVENOUS | Status: DC

## 2020-04-09 MED ORDER — SODIUM CHLORIDE 0.9 % IV SOLN
2.0000 g | Freq: Once | INTRAVENOUS | Status: AC
  Administered 2020-04-09: 2 g via INTRAVENOUS
  Filled 2020-04-09: qty 2

## 2020-04-09 NOTE — ED Notes (Signed)
Palliative Care spoke to patients wife, she would like to continue with pressors and fluids for patient. She continues to keep patient as DNR/DNI. Patient tolerating bipap. ICU provider at bedside for central line placement for pressors.

## 2020-04-09 NOTE — ED Notes (Signed)
E-consent for central line placement signed at bedside with NP, RN and pt wife.

## 2020-04-09 NOTE — H&P (Signed)
History and Physical    Jordan PennaDouglas Kline RUE:[id number] DOB: [birth year] DOA: 11/08/20  Referring MD/NP/PA:   PCP: Jordan Kline, Jordan Ahmed, MD   Patient coming from:  The patient is coming from home.    Chief Complaint: SOB  HPI: Jordan Kline is a 87 y.o. male with medical history significant of dCHF, HTN, HLD, stroke, A fib, CKD-IIIb, chronic foley, C diff, GIB, presents with SOB.   Per his son and wife (I called family by phone), patient developed shortness of breath since yesterday, which has been progressively worsening.  Patient also has fever and chills.  His body temperature is 102.1 in ED. patient has cough, but no chest pain.  He has nausea and vomited once at home, currently no nausea, vomiting, diarrhea or abdominal pain.  Patient has a chronic Foley catheter since 2016, which was changed 2 days ago per family.  Patient was found to have oxygen desaturation to 80% on RA and placed on CPAP by EMS, with improvement to 91% on CPAP. Pt was given 1.5 inches nitro paste en route. Pt has severe respiratory distress at arrival, cannot speak in full sentence.  BiPAP is started in ED. Patient has persistent hypotension with blood pressure 84/64, which improved to 102/71 after giving 1.75 L of IV fluid (1.5 L LR, 250 cc of NS), but then decreased to 77/53. IV Levophed is started. currently pt is in comatose status, not arousable, not following command.  Addendum: blood culture comes back positive for GNR with E. Coli on BCID  ED Course: pt was found to have WBC 5.8, BNP 2221, negative COVID PCR, lactic acid 3.2, INR 1.2, PTT 28, positive urinalysis (therapeutic appearance, large amount of leukocyte, many bacteria, WBC> 50), worsening renal function, temperature 102.1, heart rate 116, 45, chest x-ray showed bilateral infiltration (Right is worse than the left). Pt is admitted to stepdown as inpatient.  Dr. Oley BalmAlesklerov of ICU is consulted.  Review of Systems: Could not be reviewed since patient  is comatose status.  Allergy: No Known Allergies  Past Medical History:  Diagnosis Date  . A-fib (HCC)    On Xarelto  . Chronic kidney disease (CKD)   . High cholesterol   . Hypertension   . Inguinal hernia   . Stroke [facility](HCC)    With left-sided hemiparesis  . Urinary retention    Chronic Foley catheter since December 2016    Past Surgical History:  Procedure Laterality Date  . APPENDECTOMY    . PERIPHERAL VASCULAR CATHETERIZATION N/A 11/10/2015   Procedure: Visceral Angiography, mesenteric angio;  Surgeon: Jordan NeedyJason Jordan Dew, MD;  Location: [facility] INVASIVE CV LAB;  Service: Cardiovascular;  Laterality: N/A;  . PERIPHERAL VASCULAR CATHETERIZATION N/A 11/10/2015   Procedure: Visceral Artery Intervention;  Surgeon: Jordan NeedyJason Jordan Dew, MD;  Location: [facility] INVASIVE CV LAB;  Service: Cardiovascular;  Laterality: N/A;    Social History:  reports that he has quit smoking. He has never used smokeless tobacco. He reports that he does not drink alcohol and does not use drugs.  Family History:  Family History  Problem Relation Age of Onset  . Hypertension Father   . Kidney cancer Neg Hx   . Kidney disease Neg Hx   . Prostate cancer Neg Hx      Prior to Admission medications   Medication Sig Start Date End Date Taking? Authorizing Provider  acetaminophen (TYLENOL) 325 MG tablet Take 650 mg by mouth every 4 (four) hours as needed. for pain/ increased temp. May be administered orally,  per G-tube if needed or rectally if unable to swallow (separate order). Maximum dose for 24 hours is 3,000 mg from all sources of Acetaminophen/ Tylenol   Yes [provider]  Amino Acids-Protein Hydrolys (FEEDING SUPPLEMENT, PRO-STAT SUGAR FREE 64,) LIQD Take 30 mLs by mouth 2 (two) times daily between meals.   Yes [provider]  atorvastatin (LIPITOR) 80 MG tablet Take 80 mg by mouth every evening.    Yes [provider]  carvedilol (COREG) 25 MG tablet Take 25 mg by mouth 2 (two) times daily.  03/11/20  Yes [provider]  docusate sodium (COLACE) 100 MG capsule Take 100 mg by mouth 2 (two) times daily.    Yes [provider]  ferrous sulfate 325 (65 FE) MG tablet Take 1 tablet (325 mg total) by mouth 2 (two) times daily with a meal. 03/04/15  Yes Jordan Bilberry, MD  food thickener (THICK IT) POWD Take by mouth. Use to mix thin liquids to nectar thick consistency as needed   Yes [provider]  Infant Care Products (DERMACLOUD) CREA Apply liberal amount topically to area of skin irritation prn. OK to leave at bedside.   Yes [provider]  Multiple Vitamin (MULTIVITAMIN WITH MINERALS) TABS tablet Take 1 tablet by mouth daily.   Yes [provider]  Nutritional Supplements (ENSURE ENLIVE PO) Take 1 Bottle by mouth 3 (three) times daily. For 8 oz bottle, add 2 tablespoons of thickner. Stir for 15 seconds until dissolved.   Yes [provider]  Probiotic Product (RISA-BID PROBIOTIC) TABS Take 1 tablet by mouth 2 (two) times daily.   Yes [provider]  sacubitril-valsartan (ENTRESTO) 49-51 MG Take 1 tablet by mouth 2 (two) times daily. 04/16/17  Yes Jordan Highland, MD    Physical Exam: Vitals:   04/06/2020 1530 04/03/2020 1556 04/24/2020 1600 04/04/2020 1620  BP: (!) 73/58 (!) 71/52 (!) 68/54 (!) 81/60  Pulse: (!) 117 (!) 101 (!) 104 (!) 104  Resp: (!) 26 (!) 27 (!) 25 (!) 31  Temp:      TempSrc:      SpO2: 97% (!) 87% (!) 87% (!) 88%  Weight:      Height:       General: Not in acute distress HEENT:       Eyes:  no scleral icterus.       ENT: No discharge from the ears and nose.       Neck: No JVD, no bruit, no mass felt. Heme: No neck lymph node enlargement. Cardiac: S1/S2, RRR, No murmurs, No gallops or rubs. Respiratory: Diffuse crackles bilaterally GI: Soft, nondistended, nontender, no organomegaly, BS present. GU: No hematuria Ext: trace leg edema bilaterally. 1+DP/PT pulse bilaterally. Musculoskeletal: No  joint deformities, No joint redness or warmth, no limitation of ROM in spin. Skin: No rashes.  Neuro: Patient is in comatose status, not arousable, not following commands. Slightly moving extremities randomly. Psych: Patient is not psychotic.  Labs on Admission: I have personally reviewed following labs and imaging studies  CBC: Recent Labs  Lab 04/15/2020 0349  WBC 5.8  NEUTROABS 5.2  HGB 10.8*  HCT 34.0*  MCV 101.5*  PLT 107*   Basic Metabolic Panel: Recent Labs  Lab 04/13/2020 0349  NA 140  K 4.3  CL 107  CO2 22  GLUCOSE 124*  BUN 69*  CREATININE 2.98*  CALCIUM 8.7*   GFR: Estimated Creatinine Clearance: 12.4 mL/min (A) (by C-G formula based on SCr of 2.98 mg/dL (  H)). Liver Function Tests: Recent Labs  Lab 04/20/2020 0349  AST 28  ALT 20  ALKPHOS 84  BILITOT 0.7  PROT 5.7*  ALBUMIN 2.8*   No results for input(Jordan): LIPASE, AMYLASE in the last 168 hours. No results for input(Jordan): AMMONIA in the last 168 hours. Coagulation Profile: Recent Labs  Lab 20-Apr-2020 0349  INR 1.2   Cardiac Enzymes: No results for input(Jordan): CKTOTAL, CKMB, CKMBINDEX, TROPONINI in the last 168 hours. BNP (last 3 results) No results for input(Jordan): PROBNP in the last 8760 hours. HbA1C: No results for input(Jordan): HGBA1C in the last 72 hours. CBG: No results for input(Jordan): GLUCAP in the last 168 hours. Lipid Profile: No results for input(Jordan): CHOL, HDL, LDLCALC, TRIG, CHOLHDL, LDLDIRECT in the last 72 hours. Thyroid Function Tests: No results for input(Jordan): TSH, T4TOTAL, FREET4, T3FREE, THYROIDAB in the last 72 hours. Anemia Panel: No results for input(Jordan): VITAMINB12, FOLATE, FERRITIN, TIBC, IRON, RETICCTPCT in the last 72 hours. Urine analysis:    Component Value Date/Time   COLORURINE YELLOW (A) 04-20-2020 0349   APPEARANCEUR TURBID (A) 20-Apr-2020 0349   LABSPEC 1.014 04-20-2020 0349   PHURINE 5.0 2020-04-20 0349   GLUCOSEU NEGATIVE April 20, 2020 0349   HGBUR LARGE (A) 2020/04/20 0349    BILIRUBINUR NEGATIVE 2020/04/20 0349   KETONESUR NEGATIVE 2020-04-20 0349   PROTEINUR 100 (A) 04-20-20 0349   NITRITE NEGATIVE Apr 20, 2020 0349   LEUKOCYTESUR LARGE (A) 04-20-2020 0349   Sepsis Labs: @LABRCNTIP (procalcitonin:4,lacticidven:4) ) Recent Results (from the past 240 hour(Jordan))  Blood Culture (routine x 2)     Status: None (Preliminary result)   Collection Time: 2020-04-20  3:49 AM   Specimen: BLOOD  Result Value Ref Range Status   Specimen Description BLOOD RIGHT [facility]  Final   Special Requests   Final    BOTTLES DRAWN AEROBIC AND ANAEROBIC Blood Culture adequate volume   Culture  Setup Time   Final    Organism ID to follow GRAM NEGATIVE RODS IN BOTH AEROBIC AND ANAEROBIC BOTTLES CRITICAL RESULT CALLED TO, READ BACK BY AND VERIFIED WITH: CARISSA DOLAN ON Apr 20, 2020 AT 1346 QSD Performed at [facility], [address] [place], [place]    Culture GRAM NEGATIVE RODS  Final   Report Status PENDING  Incomplete  Blood Culture ID Panel (Reflexed)     Status: Abnormal   Collection Time: April 20, 2020  3:49 AM  Result Value Ref Range Status   Enterococcus faecalis NOT DETECTED NOT DETECTED Final   Enterococcus Faecium NOT DETECTED NOT DETECTED Final   Listeria monocytogenes NOT DETECTED NOT DETECTED Final   Staphylococcus species NOT DETECTED NOT DETECTED Final   Staphylococcus aureus (BCID) NOT DETECTED NOT DETECTED Final   Staphylococcus epidermidis NOT DETECTED NOT DETECTED Final   Staphylococcus lugdunensis NOT DETECTED NOT DETECTED Final   Streptococcus species NOT DETECTED NOT DETECTED Final   Streptococcus agalactiae NOT DETECTED NOT DETECTED Final   Streptococcus pneumoniae NOT DETECTED NOT DETECTED Final   Streptococcus pyogenes NOT DETECTED NOT DETECTED Final   A.calcoaceticus-baumannii NOT DETECTED NOT DETECTED Final   Bacteroides fragilis NOT DETECTED NOT DETECTED Final   Enterobacterales DETECTED (A) NOT DETECTED Final    Comment: Enterobacterales  represent a large order of gram negative bacteria, not a single organism. CRITICAL RESULT CALLED TO, READ BACK BY AND VERIFIED WITH: CARISSA DOLAN ON Apr 20, 2020 AT 1346 QSD    Enterobacter cloacae complex NOT DETECTED NOT DETECTED Final   Escherichia coli DETECTED (A) NOT DETECTED Final    Comment: CRITICAL  RESULT CALLED TO, READ BACK BY AND VERIFIED WITH: CARISSA DOLAN ON 03/29/2020 AT 1346 QSD    Klebsiella aerogenes NOT DETECTED NOT DETECTED Final   Klebsiella oxytoca NOT DETECTED NOT DETECTED Final   Klebsiella pneumoniae NOT DETECTED NOT DETECTED Final   Proteus species NOT DETECTED NOT DETECTED Final   Salmonella species NOT DETECTED NOT DETECTED Final   Serratia marcescens NOT DETECTED NOT DETECTED Final   Haemophilus influenzae NOT DETECTED NOT DETECTED Final   Neisseria meningitidis NOT DETECTED NOT DETECTED Final   Pseudomonas aeruginosa NOT DETECTED NOT DETECTED Final   Stenotrophomonas maltophilia NOT DETECTED NOT DETECTED Final   Candida albicans NOT DETECTED NOT DETECTED Final   Candida auris NOT DETECTED NOT DETECTED Final   Candida glabrata NOT DETECTED NOT DETECTED Final   Candida krusei NOT DETECTED NOT DETECTED Final   Candida parapsilosis NOT DETECTED NOT DETECTED Final   Candida tropicalis NOT DETECTED NOT DETECTED Final   Cryptococcus neoformans/gattii NOT DETECTED NOT DETECTED Final   CTX-M ESBL NOT DETECTED NOT DETECTED Final   Carbapenem resistance IMP NOT DETECTED NOT DETECTED Final   Carbapenem resistance KPC NOT DETECTED NOT DETECTED Final   Carbapenem resistance NDM NOT DETECTED NOT DETECTED Final   Carbapenem resist OXA 48 LIKE NOT DETECTED NOT DETECTED Final   Carbapenem resistance VIM NOT DETECTED NOT DETECTED Final    Comment: Performed at [facility], [address] [place], [place] [address]  Blood Culture (routine x 2)     Status: None (Preliminary result)   Collection Time: 04/08/2020  4:12 AM   Specimen: BLOOD  Result Value Ref Range  Status   Specimen Description BLOOD RIGHT ANTECUBITAL  Final   Special Requests   Final    BOTTLES DRAWN AEROBIC AND ANAEROBIC Blood Culture adequate volume   Culture  Setup Time   Final    GRAM NEGATIVE RODS IN BOTH AEROBIC AND ANAEROBIC BOTTLES CRITICAL VALUE NOTED.  VALUE IS CONSISTENT WITH PREVIOUSLY REPORTED AND CALLED VALUE. Performed at [facility], [address] [place], [place] [address]    Culture GRAM NEGATIVE RODS  Final   Report Status PENDING  Incomplete  Resp Panel by RT-PCR (Flu A&B, Covid) Nasopharyngeal Swab     Status: None   Collection Time: 04/06/2020  4:12 AM   Specimen: Nasopharyngeal Swab; Nasopharyngeal(NP) swabs in vial transport medium  Result Value Ref Range Status   SARS Coronavirus 2 by RT PCR NEGATIVE NEGATIVE Final    Comment: (NOTE) SARS-CoV-2 target nucleic acids are NOT DETECTED.  The SARS-CoV-2 RNA is generally detectable in upper respiratory specimens during the acute phase of infection. The lowest concentration of SARS-CoV-2 viral copies this assay can detect is 138 copies/mL. A negative result does not preclude SARS-Cov-2 infection and should not be used as the sole basis for treatment or other patient management decisions. A negative result may occur with  improper specimen collection/handling, submission of specimen other than nasopharyngeal swab, presence of viral mutation(Jordan) within the areas targeted by this assay, and inadequate number of viral copies(<138 copies/mL). A negative result must be combined with clinical observations, patient history, and epidemiological information. The expected result is Negative.  Fact Sheet for Patients:  BloggerCourse.comhttps://www.fda.gov/media/152166/download  Fact Sheet for Healthcare Providers:  SeriousBroker.ithttps://www.fda.gov/media/152162/download  This test is no t yet approved or cleared by the [place] FDA and  has been authorized for detection and/or diagnosis of SARS-CoV-2 by FDA under an Emergency  Use Authorization (EUA). This EUA will remain  in effect (  meaning this test can be used) for the duration of the COVID-19 declaration under Section 564(b)(1) of the Act, 21 U.[place].C.section 360bbb-3(b)(1), unless the authorization is terminated  or revoked sooner.       Influenza A by PCR NEGATIVE NEGATIVE Final   Influenza B by PCR NEGATIVE NEGATIVE Final    Comment: (NOTE) The Xpert Xpress SARS-CoV-2/FLU/RSV plus assay is intended as an aid in the diagnosis of influenza from Nasopharyngeal swab specimens and should not be used as a sole basis for treatment. Nasal washings and aspirates are unacceptable for Xpert Xpress SARS-CoV-2/FLU/RSV testing.  Fact Sheet for Patients: BloggerCourse.com  Fact Sheet for Healthcare Providers: SeriousBroker.it  This test is not yet approved or cleared by the [place] FDA and has been authorized for detection and/or diagnosis of SARS-CoV-2 by FDA under an Emergency Use Authorization (EUA). This EUA will remain in effect (meaning this test can be used) for the duration of the COVID-19 declaration under Section 564(b)(1) of the Act, 21 U.[place].C. section 360bbb-3(b)(1), unless the authorization is terminated or revoked.  Performed at [facility], [address] [place], [place] [address]      Radiological Exams on Admission: DG Chest Portable 1 View  Result Date: 04/30/2020 CLINICAL DATA:  Internal jugular catheter insertion attempt. EXAM: PORTABLE CHEST 1 VIEW COMPARISON:  2020/04/30 at 0336 hours FINDINGS: The cardiac silhouette is borderline enlarged. Aortic atherosclerosis is noted. There is increased density in the right lung base which may reflect a combination of airspace opacity and a new small pleural effusion. Patchy bilateral airspace opacities elsewhere are similar to the prior study. No pneumothorax is identified. IMPRESSION: Increased right basilar density which may reflect  worsening pneumonia or atelectasis and a small pleural effusion. Unchanged bilateral airspace disease elsewhere. No pneumothorax. Electronically Signed   By: Sebastian Ache M.D.   On: 30-Apr-2020 14:47   DG Chest Portable 1 View  Result Date: Apr 30, 2020 CLINICAL DATA:  Respiratory distress EXAM: PORTABLE CHEST 1 VIEW COMPARISON:  05/15/2019 FINDINGS: Bilateral airspace disease, right greater than left. Heart is normal size. No effusions. No acute bony abnormality. IMPRESSION: Bilateral airspace opacities, right greater than left concerning for pneumonia. Electronically Signed   By: Charlett Nose M.D.   On: 2020-04-30 03:49     EKG: I have personally reviewed. A fib, QTC 493, nonspecific T wave change, low voltage.  Assessment/Plan Principal Problem:   HCAP (healthcare-associated pneumonia) Active Problems:   Recurrent UTI   Acute renal failure superimposed on stage 3b chronic kidney disease (HCC)   Atrial fibrillation with RVR (HCC)   Chronic CHF (HCC)   Severe sepsis with septic shock (HCC)   HTN (hypertension)   HLD (hyperlipidemia)   Stroke (HCC)   Iron deficiency anemia   Thrombocytopenia (HCC)   Bacteremia due to Gram-negative bacteria   Sever sepsis with septic shock due to HCAP and recurrent UTI and bacteremia due to Gram-negative bacteria:  Pt was started on Levophed. ID, Dr. Joylene Draft is consulted.  Dr. Karna Christmas of PCCM is also consulted. Lactic acid 3.2.  -Admitted to SDU as inpatient - IV Vancomycin and cefepime (patient also received 1 dose of azithromycin in ED) - Mucinex for cough  - Bronchodilators - Urine legionella and Jordan. pneumococcal antigen - Follow up blood culture x2, sputum culture  - will get Procalcitonin and trend lactic acid level per sepsis protocol - IVF: 1.5 L of LR and 500 cc of NS bolus in ED, will continue LR at 75 cc/h  Acute renal  failure superimposed on stage 3b chronic kidney disease (HCC): Baseline creatinine 1.62 on 05/19/2019.  His creatinine  is at 2.98, BUN 69.  Possibly due to UTI -Avoid using renal toxic medications -Hold Entresto  Atrial fibrillation with RVR (HCC): HR 90-110s. -hole coreg due to hypotension  Chronic CHF (HCC): 2D echo on 05/15/2019 showed EF of 50-55%.  Patient has trace leg edema, but has elevated BNP 2221, indicating fluid overload.  Due to severe sepsis, will not start diuretics -Watch volume status closely  HTN (hypertension) -Hold blood pressure medications  HLD (hyperlipidemia) -Continue Lipitor when able to take oral medication  Stroke (HCC) -Lipitor when able to take  Iron deficiency anemia -Continue iron supplement when able to take pills  Thrombocytopenia (HCC): This is a chronic issue.  Platelet 107.  No bleeding tendency. -Follow-up with a CBC   Goal of care: I discussed with patient'Jordan family, including his son and wife by phone and his granddaughter at the bedside.  At this moment, they want patient to be treated, but will consider comfort care if patient deteriorates.  Will consult palliative care team.   DVT ppx: SCD Code Status: Patient has partial code.  Per family,vasopressor can be given.  No CPR and no intubation Family Communication:  Yes, patient'Jordan son and wife by phone and granddaughter at the bedside Disposition Plan:  Anticipate discharge back to previous environment Consults called:  Dr. Karna Christmas of ICU Admission status:     SDU/inpation        Status is: Inpatient  Remains inpatient appropriate because:Inpatient level of care appropriate due to severity of illness   Dispo: The patient is from: Home              Anticipated d/c is to: Home              Anticipated d/c date is: 2 days              Patient currently is not medically stable to d/c.          Date of Service 04/23/2020    Lorretta Harp [facility]   If 7PM-7AM, please contact night-coverage [email or web address] 2020/04/23, 5:04 PM

## 2020-04-09 NOTE — ED Notes (Addendum)
Granddaughter calling family, will decide on further treatment plans for patient. Family to come to ED to see patient. Charge RN, Scientist, research (medical) aware and approves.

## 2020-04-09 NOTE — ED Notes (Addendum)
Dr. Clyde Lundborg at bedside. Aware of low BP and low oxygen saturation. Granddaughter, Alcario Drought at bedside and is updated on plan of care and prognosis for patient by Dr. Clyde Lundborg. Alcario Drought, granddaughter calling family to update at this time.

## 2020-04-09 NOTE — Progress Notes (Signed)
PHARMACY - PHYSICIAN COMMUNICATION CRITICAL VALUE ALERT - BLOOD CULTURE IDENTIFICATION (BCID)  Jordan Kline is an 87 y.o. male who presented to [facility] on 04/04/2020 with a chief complaint of shortness of breath, hypotension  Assessment:  1/14 Bcx with GNR, BCID = E. Coli (no resistance).  Concern for pneumonia on CXR and UTI in setting of chronic foley catheter  Name of physician (or Provider) Contacted: Dr. Clyde Lundborg  Current antibiotics: vancomycin and cefepime  Changes to prescribed antibiotics recommended:  Recommendations declined by provider due to sepsis  Results for orders placed or performed during the hospital encounter of 03/31/2020  Blood Culture ID Panel (Reflexed) (Collected: 04/06/2020  3:49 AM)  Result Value Ref Range   Enterococcus faecalis NOT DETECTED NOT DETECTED   Enterococcus Faecium NOT DETECTED NOT DETECTED   Listeria monocytogenes NOT DETECTED NOT DETECTED   Staphylococcus species NOT DETECTED NOT DETECTED   Staphylococcus aureus (BCID) NOT DETECTED NOT DETECTED   Staphylococcus epidermidis NOT DETECTED NOT DETECTED   Staphylococcus lugdunensis NOT DETECTED NOT DETECTED   Streptococcus species NOT DETECTED NOT DETECTED   Streptococcus agalactiae NOT DETECTED NOT DETECTED   Streptococcus pneumoniae NOT DETECTED NOT DETECTED   Streptococcus pyogenes NOT DETECTED NOT DETECTED   A.calcoaceticus-baumannii NOT DETECTED NOT DETECTED   Bacteroides fragilis NOT DETECTED NOT DETECTED   Enterobacterales DETECTED (A) NOT DETECTED   Enterobacter cloacae complex NOT DETECTED NOT DETECTED   Escherichia coli DETECTED (A) NOT DETECTED   Klebsiella aerogenes NOT DETECTED NOT DETECTED   Klebsiella oxytoca NOT DETECTED NOT DETECTED   Klebsiella pneumoniae NOT DETECTED NOT DETECTED   Proteus species NOT DETECTED NOT DETECTED   Salmonella species NOT DETECTED NOT DETECTED   Serratia marcescens NOT DETECTED NOT DETECTED   Haemophilus influenzae NOT DETECTED NOT DETECTED    Neisseria meningitidis NOT DETECTED NOT DETECTED   Pseudomonas aeruginosa NOT DETECTED NOT DETECTED   Stenotrophomonas maltophilia NOT DETECTED NOT DETECTED   Candida albicans NOT DETECTED NOT DETECTED   Candida auris NOT DETECTED NOT DETECTED   Candida glabrata NOT DETECTED NOT DETECTED   Candida krusei NOT DETECTED NOT DETECTED   Candida parapsilosis NOT DETECTED NOT DETECTED   Candida tropicalis NOT DETECTED NOT DETECTED   Cryptococcus neoformans/gattii NOT DETECTED NOT DETECTED   CTX-M ESBL NOT DETECTED NOT DETECTED   Carbapenem resistance IMP NOT DETECTED NOT DETECTED   Carbapenem resistance KPC NOT DETECTED NOT DETECTED   Carbapenem resistance NDM NOT DETECTED NOT DETECTED   Carbapenem resist OXA 48 LIKE NOT DETECTED NOT DETECTED   Carbapenem resistance VIM NOT DETECTED NOT DETECTED    Juliette Alcide, PharmD, BCPS.   Work Cell: [phone] 04/12/2020 2:00 PM

## 2020-04-09 NOTE — ED Notes (Signed)
Patient restless and trying to take off bipap, mitten placed to right hand. Family at bedside

## 2020-04-09 NOTE — Progress Notes (Signed)
Attempt made to place left IJ CVC.  Able to successfully cannulate the left IJ with ease, however guidewire will only advance about 3/4 length before meeting resistance.  Able to advance catheter over guidewire without difficulty, however guidewire does not want to withdraw.  Therefore attempt aborted. Pressure held at the site.  No change in vital signs.  Will obtain CXR to assess for pneumothorax since Left IJ was cannulated.  Will place line in Femoral vein.    Harlon Ditty, AGACNP-BC [facility] Pager: [phone]

## 2020-04-09 NOTE — ED Notes (Signed)
Per Dr. Clyde Lundborg patient levophed gtt to be increased

## 2020-04-09 NOTE — Progress Notes (Signed)
PHARMACY -  BRIEF ANTIBIOTIC NOTE   Pharmacy has received consult(s) for Cefepime and Vancomycin from an ED provider.  The patient's profile has been reviewed for ht/wt/allergies/indication/available labs.    One time order(s) placed for Cefepime 2gm and Vancomycin 1gm  Further antibiotics/pharmacy consults should be ordered by admitting physician if indicated.                       Thank you, Valrie Hart A Apr 18, 2020  3:49 AM

## 2020-04-09 NOTE — ED Notes (Signed)
Time Out completed by this RN and with ICU provider. Consent for central line in chart. ICU provider at bedside to complete central line placement. This RN at bedside to assist.

## 2020-04-09 NOTE — Consult Note (Signed)
NAME:  Jordan Kline, MRN:  [id number], DOB:  30-Sep-1927, LOS: 0 ADMISSION DATE:  04/12/2020, CONSULTATION DATE:  04/04/2020 REFERRING MD:  Dr. Blaine Hamper, CHIEF COMPLAINT:  Acute Respiratory Distress, Hypoxia  Brief History:  87 y.o. Male with a PMH significant for HFpEF, A. Fib on Xarelto, CKD III, chronic foley, stroke with Left hemiparesis admitted 04/22/2020 with Septic shock in the setting of UTI & Community Acquired Pneumonia vs. Aspiration Pneumonia, along with AKI superimposed on CKD Stage III.  History of Present Illness:  Jordan Kline is a 87 year old male with a past medical history significant for HFpEF, hypertension, hyperlipidemia, stroke with left hemiparesis, atrial fibrillation (previously on Xarelto), chronic Foley, C. difficile, GI bleed who [facility] ED on 04/12/2020 due to complaints of shortness of breath.  Patiently is currently on BiPAP and is a poor historian, therefore history is obtained from wife and ED and nursing notes.  Of note his chronic foley catheter was changed out 2 days ago.  Per his wife, he developed shortness of breath that began approximately yesterday, however progressively worsened earlier this morning around 1 AM.  He also noted to have associated fever and chills.  Denied chest pain, cough, nausea, vomiting, diarrhea, abdominal pain.  Upon EMS arrival he was noted to be hypoxic with O2 saturations 80% on room air of which she was placed on CPAP with improvement to 91%.  ED course: Upon arrival to the ED he was noted to be in severe respiratory distress and unable to speak in full sentences. Vital signs included: Temperature 102.1 F, respiratory rate 40, pulse 116, blood pressure 101/74.  He was immediately started on BiPAP.  He was also noted to become hypotensive with blood pressure 84/64 of which he was given 1.5 L of IV fluids.  Despite IV fluid resuscitation he remained hypotensive with blood pressure 77/53, of which Levophed infusion was started.     Chest x-ray with bilateral airspace opacities with the right greater than left, concerning for pneumonia.  Urinalysis is consistent with urinary tract infection.  His COVID-19 PCR and influenza PCR are both negative.  Lab work is notable for BNP 2221, lactic acid 3.2, WBC 5.8 with lymphocytopenia and moderate left shift, hemoglobin 10.8, platelets 107, BUN 69, creatinine 2.98, glucose 124.  EKG with atrial fibrillation and nonspecific ST abnormalities.  He met sepsis criteria therefore he received IV fluid resuscitation and broad-spectrum antibiotics with azithromycin, cefepime, and vancomycin.  PCCM is asked to see the patient for further work-up and treatment of septic shock in the setting of UTI and community-acquired pneumonia versus aspiration pneumonia, along with AKI superimposed on CKD Stage III.  His wife confirms he is a LIMITED CODE, agreeable only to vasoactive medications (no CPR, no intubation, no defibrillation).  His wife is agreeable to vasopressors and central line placement.  Past Medical History:  HFpEF Atrial Fibrillation on Xarelto CKD III Urinary retention with chronic foley Stroke Hyperlipidemia Hypertension  Significant Hospital Events:  1/14: Admission to ICU  Consults:  Hospitalist PCCM  Procedures:  N/A  Significant Diagnostic Tests:  1/14: CXR>>Bilateral airspace opacities, right greater than left concerning for pneumonia.  Micro Data:  1/14: SARS-CoV-2 PCR>>negative 1/14: Influenza A&B PCR>>negative 1/14: Blood culture x2>>] 1/14: Urine>> 1/14: Sputum>> 1/14: Strep pneumo urinary antigen>> 1/14: Legionella urinary antigen>>  Antimicrobials:  Azithromycin x1 dose 1/14 Cefepime 1/14>> Vancomycin 1/14>>  Interim History / Subjective:  Pt on BiPAP and tolerating Requiring 10 mcg Levophed to maintain MAP >65, needs central line placement  Denies SOB, chest pain, abdominal pain, N/V/D Pt's wife confirms Limited Code (only Vasoactive drugs), no  CPR, intubation, or debrillation   Objective   Blood pressure (!) 89/67, pulse 99, temperature (!) 96.8 F (36 C), temperature source Rectal, resp. rate (!) 32, height 5' 8" (1.727 m), weight 53.1 kg, SpO2 95 %.    FiO2 (%):  [100 %] 100 %   Intake/Output Summary (Last 24 hours) at 04/25/2020 0847 Last data filed at 04/26/2020 6606 Gross per 24 hour  Intake 1600 ml  Output -  Net 1600 ml   Filed Weights   04/15/2020 0332  Weight: 53.1 kg    Examination: General: Acute on chronically ill appearing male, sitting in bed, on BiPAP, in NAD HENT: Atraumatic, normocephalic, neck supple, no JVD Lungs: Coarse rhonchi bilaterally, no wheezing, Bipap assisted, even Cardiovascular: Tachycardia, regular rhythm, 1+ distal pulses Abdomen: Soft, nontender, nondistended, no guarding or rebound tenderness, BS+ x4 Extremities: Left Hemiparesis (baseline), moderate strength to RUE & RLE, Trace edema to bilateral LE Neuro: Awake and alert, unable to determine orientation due to BiPAP,  Skin: Warm and dry.  No obvious rashes, lesions, or ulcerations  Resolved Hospital Problem list   N/A  Assessment & Plan:   Septic Shock Atrial Fibrillation Chronic HFpEF without acute exacerbation Hx: HFpEF, HLD, A. Fib on Xarelto (wife reports he hasn't been on since over the summer) -Continuous cardiac monitoring -Maintain MAP >65 -Received IV fluid resuscitation in ED -Gentle IV fluids given CHF history -Vasopressors as needed to maintain MAP goal -No diuresis due to hypotension and AKI -Trend lactic acid -Check High sensitivity Troponin>>95 -Check serum Cortisol -Consider repeat Echocardiogram (most recent ECHO 05/15/19 with LVEF 50 to 55%, indeterminate Diastolic parameters, RV systolic function normal, Severely elevated pulmonary artery pressure, moderate to severe mitral valve regurgitation, moderate to severe Tricuspid valve regurgitation)   Severe Sepsis in the setting of UTI & Community Acquired  Pneumonia vs. Aspiration Pneumonia -Monitor fever curve -Trend WBC's and Procalcitonin -Follow cultures as above -Continue Cefepime & Vancomycin for now pending Cultures & Sensitivities   Acute Hypoxic Respiratory Failure in the setting of CAP vs. Aspiration -Supplemental O2 as needed to maintain O2 sats >92% -Currently on BiPAP, wean as tolerated -Pt is DNI -Follow intermittent CXR & ABG as needed -Continue Cefepime and Vancomycin for now -Prn Bronchodilators   AKI superimposed on CKD III -Monitor I&O's / urinary output -Follow BMP -Ensure adequate renal perfusion -Avoid nephrotoxic agents as able -Replace electrolytes as indicated -Gentle IV Fluids   Anemia without s/sx of Bleeding Thrombocytopenia, suspect in setting of Sepsis -Monitor for S/Sx of bleeding -Trend CBC -SQ Heparin for VTE Prophylaxis  -Transfuse for Hgb <7 -Transfuse platelets for platelet count <50 and active bleeding   Best practice (evaluated daily)  Diet: NPO while on BiPAP Pain/Anxiety/Delirium protocol (if indicated): N/A VAP protocol (if indicated): N/A DVT prophylaxis: Heparin SQ GI prophylaxis: N/A Glucose control: N/A Mobility: Bedrest Disposition:ICU  Goals of Care:  Last date of multidisciplinary goals of care discussion:04/16/2020 Family and staff present: Pt's wife and granddaughter updated at bedside, also bedside RN Summary of discussion: Septic shock due to PNA and UTI requiring vasopressors, will need central line Follow up goals of care discussion due: 17-Apr-2020 Code Status: Verified with wife, Limited Code (only Vasoactive drugs, NO CPR, INTUBATION, DEFIBRILLATION, ACLS DRUGS)  Labs   CBC: Recent Labs  Lab 04/12/2020 0349  WBC 5.8  NEUTROABS 5.2  HGB 10.8*  HCT 34.0*  MCV 101.5*  PLT  107*    Basic Metabolic Panel: Recent Labs  Lab 03/29/2020 0349  NA 140  K 4.3  CL 107  CO2 22  GLUCOSE 124*  BUN 69*  CREATININE 2.98*  CALCIUM 8.7*   GFR: Estimated Creatinine  Clearance: 12.4 mL/min (A) (by C-G formula based on SCr of 2.98 mg/dL (H)). Recent Labs  Lab 04/26/2020 0349  WBC 5.8  LATICACIDVEN 3.2*    Liver Function Tests: Recent Labs  Lab 03/31/2020 0349  AST 28  ALT 20  ALKPHOS 84  BILITOT 0.7  PROT 5.7*  ALBUMIN 2.8*   No results for input(s): LIPASE, AMYLASE in the last 168 hours. No results for input(s): AMMONIA in the last 168 hours.  ABG No results found for: PHART, PCO2ART, PO2ART, HCO3, TCO2, ACIDBASEDEF, O2SAT   Coagulation Profile: Recent Labs  Lab 04/20/2020 0349  INR 1.2    Cardiac Enzymes: No results for input(s): CKTOTAL, CKMB, CKMBINDEX, TROPONINI in the last 168 hours.  HbA1C: Hgb A1c MFr Bld  Date/Time Value Ref Range Status  11/08/2015 04:28 PM  4.0 - 6.0 % Final   UNABLE TO REPORT A1C DUE TO UNKNOWN INTERFERING FACTOR CAUSING THE ANALYTICAL RANGE TO BE OUTSIDE OF ANALYZER RANGE.  SAMPLE SENT TO [facility] FOR AN ALTERNATIVE METHOD.    CBG: No results for input(s): GLUCAP in the last 168 hours.  Review of Systems:   Positives in BOLD: Pt denies all complaints currently Gen: Denies fever, chills, weight change, fatigue, night sweats HEENT: Denies blurred vision, double vision, hearing loss, tinnitus, sinus congestion, rhinorrhea, sore throat, neck stiffness, dysphagia PULM: Denies shortness of breath, cough, sputum production, hemoptysis, wheezing CV: Denies chest pain, edema, orthopnea, paroxysmal nocturnal dyspnea, palpitations GI: Denies abdominal pain, nausea, vomiting, diarrhea, hematochezia, melena, constipation, change in bowel habits GU: Denies dysuria, hematuria, polyuria, oliguria, urethral discharge Endocrine: Denies hot or cold intolerance, polyuria, polyphagia or appetite change Derm: Denies rash, dry skin, scaling or peeling skin change Heme: Denies easy bruising, bleeding, bleeding gums Neuro: Denies headache, numbness, weakness, slurred speech, loss of memory or consciousness   Past Medical  History:  He,  has a past medical history of A-fib ([place]), Chronic kidney disease (CKD), High cholesterol, Hypertension, Inguinal hernia, Stroke ([place]), and Urinary retention.   Surgical History:   Past Surgical History:  Procedure Laterality Date  . APPENDECTOMY    . PERIPHERAL VASCULAR CATHETERIZATION N/A 11/10/2015   Procedure: Visceral Angiography, mesenteric angio;  Surgeon: Algernon Huxley, MD;  Location: [place] [facility];  Service: Cardiovascular;  Laterality: N/A;  . PERIPHERAL VASCULAR CATHETERIZATION N/A 11/10/2015   Procedure: Visceral Artery Intervention;  Surgeon: Algernon Huxley, MD;  Location: [place] [facility];  Service: Cardiovascular;  Laterality: N/A;     Social History:   reports that he has quit smoking. He has never used smokeless tobacco. He reports that he does not drink alcohol and does not use drugs.   Family History:  His family history includes Hypertension in his father. There is no history of Kidney cancer, Kidney disease, or Prostate cancer.   Allergies No Known Allergies   Home Medications  Prior to Admission medications   Medication Sig Start Date End Date Taking? Authorizing Provider  acetaminophen (TYLENOL) 325 MG tablet Take 650 mg by mouth every 4 (four) hours as needed. for pain/ increased temp. May be administered orally, per G-tube if needed or rectally if unable to swallow (separate order). Maximum dose for 24 hours is 3,000 mg from all sources of Acetaminophen/  Tylenol   Yes [provider]  Amino Acids-Protein Hydrolys (FEEDING SUPPLEMENT, PRO-STAT SUGAR FREE 64,) LIQD Take 30 mLs by mouth 2 (two) times daily between meals.   Yes [provider]  atorvastatin (LIPITOR) 80 MG tablet Take 80 mg by mouth every evening.    Yes [provider]  carvedilol (COREG) 25 MG tablet Take 25 mg by mouth 2 (two) times daily. 03/11/20  Yes [provider]  docusate sodium (COLACE) 100 MG capsule Take 100 mg by mouth 2 (two)  times daily.    Yes [provider]  ferrous sulfate 325 (65 FE) MG tablet Take 1 tablet (325 mg total) by mouth 2 (two) times daily with a meal. 03/04/15  Yes Dustin Flock, MD  food thickener (THICK IT) POWD Take by mouth. Use to mix thin liquids to nectar thick consistency as needed   Yes [provider]  [place] (DERMACLOUD) CREA Apply liberal amount topically to area of skin irritation prn. OK to leave at bedside.   Yes [provider]  Multiple Vitamin (MULTIVITAMIN WITH MINERALS) TABS tablet Take 1 tablet by mouth daily.   Yes [provider]  Nutritional Supplements (ENSURE ENLIVE PO) Take 1 Bottle by mouth 3 (three) times daily. For 8 oz bottle, add 2 tablespoons of thickner. Stir for 15 seconds until dissolved.   Yes [provider]  Probiotic Product (RISA-BID PROBIOTIC) TABS Take 1 tablet by mouth 2 (two) times daily.   Yes [provider]  sacubitril-valsartan (ENTRESTO) 49-51 MG Take 1 tablet by mouth 2 (two) times daily. 04/16/17  Yes Loletha Grayer, MD     Critical care time: 50 minutes    Darel Hong, [facility] [place] [facility] Pager: [phone]

## 2020-04-09 NOTE — ED Provider Notes (Signed)
[facility] Emergency Department Provider Note    Event Date/Time   First MD Initiated Contact with Patient 04/12/2020 0335     (approximate)  I have reviewed the triage vital signs and the nursing notes.   HISTORY  Chief Complaint Respiratory Distress  Level V Caveat: Resp distress   HPI Jordan Kline is a 87 y.o. male extensive past medical history as listed below presents to the ER for evaluation of worsening shortness of breath.  Wife states that she woke up this morning to use the restroom and noted the patient having trouble breathing and had what appeared to be emesis in his mouth.  He was found to be hypoxic when EMS arrived placed on CPAP.  Was given Nitropaste.  Found to be in A. fib.  Which is known to him.  Is on Xarelto.  Does not wear home oxygen.    Past Medical History:  Diagnosis Date  . A-fib (HCC)    On Xarelto  . Chronic kidney disease (CKD)   . High cholesterol   . Hypertension   . Inguinal hernia   . Stroke [facility])    With left-sided hemiparesis  . Urinary retention    Chronic Foley catheter since December 2016   Family History  Problem Relation Age of Onset  . Hypertension Father   . Kidney cancer Neg Hx   . Kidney disease Neg Hx   . Prostate cancer Neg Hx    Past Surgical History:  Procedure Laterality Date  . APPENDECTOMY    . PERIPHERAL VASCULAR CATHETERIZATION N/A 11/10/2015   Procedure: Visceral Angiography, mesenteric angio;  Surgeon: Annice Needy, MD;  Location: [facility] INVASIVE CV LAB;  Service: Cardiovascular;  Laterality: N/A;  . PERIPHERAL VASCULAR CATHETERIZATION N/A 11/10/2015   Procedure: Visceral Artery Intervention;  Surgeon: Annice Needy, MD;  Location: [facility] INVASIVE CV LAB;  Service: Cardiovascular;  Laterality: N/A;   Patient Active Problem List   Diagnosis Date Noted  . Altered mental status   . Palliative care by specialist   . AKI (acute kidney injury) (HCC) 05/14/2019  . Atrial fibrillation with RVR  (HCC) 05/14/2019  . Chronic CHF (HCC) 05/14/2019  . Hypernatremia 05/14/2019  . Sepsis secondary to UTI (HCC) 05/11/2019  . Pressure injury of skin 05/11/2019  . Sepsis due to gram-negative UTI (HCC) 05/10/2019  . Anorexia 07/08/2018  . Debility 07/08/2018  . Clostridium difficile colitis 05/29/2017  . Skin erosion 05/29/2017  . Knee effusion, right 05/29/2017  . Goals of care, counseling/discussion   . Palliative care encounter   . Sepsis (HCC) 04/08/2017  . Protein-calorie malnutrition, severe 11/09/2015  . GIB (gastrointestinal bleeding) 11/08/2015  . Recurrent UTI 07/12/2015  . UTI (lower urinary tract infection) 03/01/2015  . Urinary retention 03/01/2015      Prior to Admission medications   Medication Sig Start Date End Date Taking? Authorizing Provider  acetaminophen (TYLENOL) 325 MG tablet Take 650 mg by mouth every 4 (four) hours as needed. for pain/ increased temp. May be administered orally, per G-tube if needed or rectally if unable to swallow (separate order). Maximum dose for 24 hours is 3,000 mg from all sources of Acetaminophen/ Tylenol    [provider]  Amino Acids-Protein Hydrolys (FEEDING SUPPLEMENT, PRO-STAT SUGAR FREE 64,) LIQD Take 30 mLs by mouth 2 (two) times daily between meals.    [provider]  apixaban (ELIQUIS) 2.5 MG TABS tablet Take 1 tablet (2.5 mg total) by mouth 2 (two) times daily.  04/16/17   Alford HighlandWieting, Richard, MD  atorvastatin (LIPITOR) 80 MG tablet Take 80 mg by mouth every evening.     [provider]  carvedilol (COREG) 6.25 MG tablet Take 1 tablet (6.25 mg total) by mouth 2 (two) times daily. 04/16/17   Alford HighlandWieting, Richard, MD  diltiazem (CARDIZEM CD) 120 MG 24 hr capsule Take 1 capsule (120 mg total) by mouth every evening. 05/19/19   Lurene ShadowAyiku, Bernard, MD  docusate sodium (COLACE) 100 MG capsule Take 100 mg by mouth 2 (two) times daily.     [provider]  ferrous sulfate 325 (65 FE) MG tablet Take 1 tablet (325  mg total) by mouth 2 (two) times daily with a meal. 03/04/15   Auburn BilberryPatel, Shreyang, MD  food thickener (THICK IT) POWD Take by mouth. Use to mix thin liquids to nectar thick consistency as needed    [provider]  Infant Care Products (DERMACLOUD) CREA Apply liberal amount topically to area of skin irritation prn. OK to leave at bedside.    [provider]  Multiple Vitamin (MULTIVITAMIN WITH MINERALS) TABS tablet Take 1 tablet by mouth daily.    [provider]  mupirocin cream (BACTROBAN) 2 % Apply thin film topically to open areas on penis 2 times a day until healed    [provider]  Nutritional Supplements (ENSURE ENLIVE PO) Take 1 Bottle by mouth 3 (three) times daily. For 8 oz bottle, add 2 tablespoons of thickner. Stir for 15 seconds until dissolved.    [provider]  Probiotic Product (RISA-BID PROBIOTIC) TABS Take 1 tablet by mouth 2 (two) times daily.    [provider]  sacubitril-valsartan (ENTRESTO) 49-51 MG Take 1 tablet by mouth 2 (two) times daily. 04/16/17   Alford HighlandWieting, Richard, MD    Allergies Patient has no known allergies.    Social History Social History   Tobacco Use  . Smoking status: Former Games developermoker  . Smokeless tobacco: Never Used  . Tobacco comment: quit 50 years  Vaping Use  . Vaping Use: Never used  Substance Use Topics  . Alcohol use: No    Alcohol/week: 0.0 standard drinks  . Drug use: No    Review of Systems Patient denies headaches, rhinorrhea, blurry vision, numbness, shortness of breath, chest pain, edema, cough, abdominal pain, nausea, vomiting, diarrhea, dysuria, fevers, rashes or hallucinations unless otherwise stated above in HPI. ____________________________________________   PHYSICAL EXAM:  VITAL SIGNS: Vitals:   04/18/2020 0454 03/30/2020 0530  BP: 94/63 (!) 88/69  Pulse: (!) 112 90  Resp: (!) 38 (!) 31  Temp:    SpO2: 100% 100%    Constitutional: Alert, critically ill appearing, dried  emesis on clothing Eyes: Conjunctivae are normal.  Head: Atraumatic. Nose: No congestion/rhinnorhea. Mouth/Throat: Mucous membranes are dry   Neck: No stridor. Painless ROM.  Cardiovascular: Normal rate, regular rhythm. Grossly normal heart sounds.  Good peripheral circulation. Respiratory: right sided rhonchrous bs.  Increased work uof breathing Gastrointestinal: Soft and nontender. No distention. No abdominal bruits. No CVA tenderness. Genitourinary: deferred Musculoskeletal: No lower extremity tenderness nor edema.  No joint effusions. Neurologic:  Chronic left hemiplegia Skin:  Skin is warm, dry and intact. No rash noted. Psychiatric: Mood and affect are normal. Speech and behavior are normal.  ____________________________________________   LABS (all labs ordered are listed, but only abnormal results are displayed)  Results for orders placed or performed during the hospital encounter of 04/17/2020 (from the past 24 hour(s))  Lactic acid, plasma  Status: Abnormal   Collection Time: 04/24/2020  3:49 AM  Result Value Ref Range   Lactic Acid, Venous 3.2 (HH) 0.5 - 1.9 mmol/L  Comprehensive metabolic panel     Status: Abnormal   Collection Time: 04/01/2020  3:49 AM  Result Value Ref Range   Sodium 140 135 - 145 mmol/L   Potassium 4.3 3.5 - 5.1 mmol/L   Chloride 107 98 - 111 mmol/L   CO2 22 22 - 32 mmol/L   Glucose, Bld 124 (H) 70 - 99 mg/dL   BUN 69 (H) 8 - 23 mg/dL   Creatinine, Ser 9.48 (H) 0.61 - 1.24 mg/dL   Calcium 8.7 (L) 8.9 - 10.3 mg/dL   Total Protein 5.7 (L) 6.5 - 8.1 g/dL   Albumin 2.8 (L) 3.5 - 5.0 g/dL   AST 28 15 - 41 U/L   ALT 20 0 - 44 U/L   Alkaline Phosphatase 84 38 - 126 U/L   Total Bilirubin 0.7 0.3 - 1.2 mg/dL   GFR, Estimated 19 (L) >60 mL/min   Anion gap 11 5 - 15  CBC WITH DIFFERENTIAL     Status: Abnormal   Collection Time: 04/23/2020  3:49 AM  Result Value Ref Range   WBC 5.8 4.0 - 10.5 K/uL   RBC 3.35 (L) 4.22 - 5.81 MIL/uL   Hemoglobin 10.8 (L)  13.0 - 17.0 g/dL   HCT 54.6 (L) 27.0 - 35.0 %   MCV 101.5 (H) 80.0 - 100.0 fL   MCH 32.2 26.0 - 34.0 pg   MCHC 31.8 30.0 - 36.0 g/dL   RDW 09.3 81.8 - 29.9 %   Platelets 107 (L) 150 - 400 K/uL   nRBC 0.0 0.0 - 0.2 %   Neutrophils Relative % 90 %   Neutro Abs 5.2 1.7 - 7.7 K/uL   Lymphocytes Relative 7 %   Lymphs Abs 0.4 (L) 0.7 - 4.0 K/uL   Monocytes Relative 3 %   Monocytes Absolute 0.2 0.1 - 1.0 K/uL   Eosinophils Relative 0 %   Eosinophils Absolute 0.0 0.0 - 0.5 K/uL   Basophils Relative 0 %   Basophils Absolute 0.0 0.0 - 0.1 K/uL   WBC Morphology      MODERATE LEFT SHIFT (>5% METAS AND MYELOS,OCC PRO NOTED)   Smear Review Normal platelet morphology    Immature Granulocytes 0 %   Abs Immature Granulocytes 0.02 0.00 - 0.07 K/uL   Acanthocytes PRESENT   Protime-INR     Status: None   Collection Time: 04/20/2020  3:49 AM  Result Value Ref Range   Prothrombin Time 14.7 11.4 - 15.2 seconds   INR 1.2 0.8 - 1.2  APTT     Status: None   Collection Time: 04/20/2020  3:49 AM  Result Value Ref Range   aPTT 28 24 - 36 seconds  Type and screen [facility]     Status: None   Collection Time: 04/15/2020  3:49 AM  Result Value Ref Range   ABO/RH(D) O POS    Antibody Screen NEG    Sample Expiration      04/12/2020,2359 Performed at [facility], [address] [place], [place] [address]   Resp Panel by RT-PCR (Flu A&B, Covid) Nasopharyngeal Swab     Status: None   Collection Time: 04/04/2020  4:12 AM   Specimen: Nasopharyngeal Swab; Nasopharyngeal(NP) swabs in vial transport medium  Result Value Ref Range   SARS Coronavirus 2 by RT PCR NEGATIVE NEGATIVE  Influenza A by PCR NEGATIVE NEGATIVE   Influenza B by PCR NEGATIVE NEGATIVE   ____________________________________________  EKG My review and personal interpretation at Time: 3:31   Indication: sob  Rate: 115  Rhythm: afib Axis: normal Other: no stemi, nonspecific st  abn ____________________________________________  RADIOLOGY  I personally reviewed all radiographic images ordered to evaluate for the above acute complaints and reviewed radiology reports and findings.  These findings were personally discussed with the patient.  Please see medical record for radiology report.  ____________________________________________   PROCEDURES  Procedure(s) performed:  .Critical Care Performed by: Willy Eddy, MD Authorized by: Willy Eddy, MD   Critical care provider statement:    Critical care time (minutes):  35   Critical care time was exclusive of:  Separately billable procedures and treating other patients   Critical care was necessary to treat or prevent imminent or life-threatening deterioration of the following conditions:  Sepsis and respiratory failure   Critical care was time spent personally by me on the following activities:  Development of treatment plan with patient or surrogate, discussions with consultants, evaluation of patient's response to treatment, examination of patient, obtaining history from patient or surrogate, ordering and performing treatments and interventions, ordering and review of laboratory studies, ordering and review of radiographic studies, pulse oximetry, re-evaluation of patient's condition and review of old charts      Critical Care performed: yes  ____________________________________________   INITIAL IMPRESSION / ASSESSMENT AND PLAN / ED COURSE  Pertinent labs & imaging results that were available during my care of the patient were reviewed by me and considered in my medical decision making (see chart for details).   DDX: Asthma, copd, CHF, pna, ptx, malignancy, Pe, anemia   Jordan Kline is a 87 y.o. who presents to the ED with presentation as described above.  Patient arrives critically ill-appearing with hypoxic respiratory failure possible low blood pressures.  Nitropaste was removed.  Given  IV fluids.  Have a suspicion for aspiration possible pneumonia or sepsis.  EKG without evidence of STEMI.  Patient placed on BiPAP feels like he is getting better.  The patient will be placed on continuous pulse oximetry and telemetry for monitoring.  Laboratory evaluation will be sent to evaluate for the above complaints.     Clinical Course as of 2020-04-24 0547  Caleen Essex 2020/04/24  6629 Patient is febrile meeting septic criteria we will give IV fluids and IV antibiotics.  O2 saturations and respiratory effort improving on BiPAP. [PR]  0534 Patient is COVID-negative.  Has evidence for AKI.  Lactate is elevated receiving IV fluid resuscitation currently.  Do suspect patient has developed pneumonia and possible aspiration.  Also with indwelling Foley catheter as a possible source of infection.  IV fluids infusing.  He states his breathing is feeling much better but given respiratory distress upon arrival we will keep on BiPAP.  Will discuss with hospitalist for admission. [PR]    Clinical Course User Index [PR] Willy Eddy, MD    The patient was evaluated in Emergency Department today for the symptoms described in the history of present illness. He/she was evaluated in the context of the global COVID-19 pandemic, which necessitated consideration that the patient might be at risk for infection with the SARS-CoV-2 virus that causes COVID-19. Institutional protocols and algorithms that pertain to the evaluation of patients at risk for COVID-19 are in a state of rapid change based on information released by regulatory bodies including the CDC and federal and state  organizations. These policies and algorithms were followed during the patient's care in the ED.  As part of my medical decision making, I reviewed the following data within the electronic MEDICAL RECORD NUMBER Nursing notes reviewed and incorporated, Labs reviewed, notes from prior ED visits and [place] Controlled Substance  Database   ____________________________________________   FINAL CLINICAL IMPRESSION(S) / ED DIAGNOSES  Final diagnoses:  Acute respiratory failure with hypoxia (HCC)  Sepsis with acute hypoxic respiratory failure, due to unspecified organism, unspecified whether septic shock present (HCC)      NEW MEDICATIONS STARTED DURING THIS VISIT:  New Prescriptions   No medications on file     Note:  This document was prepared using Dragon voice recognition software and may include unintentional dictation errors.    Willy Eddyobinson, Rickie Gutierres, MD 04/24/2020 [id number]

## 2020-04-09 NOTE — ED Notes (Signed)
Lab at bedside

## 2020-04-09 NOTE — Consult Note (Signed)
Consultation Note Date: 04/16/2020   Patient Name: Jordan Kline  DOB: [birth year]  MRN: [id number]  Age / Sex: 87 y.o., male  PCP: [name], MD Referring Physician: Ivor Costa, MD  Reason for Consultation: Establishing goals of care  HPI/Patient Profile: 87 y.o. male  with past medical history of CVA s/p left hemiparesis, HFpEF, A. Fib on Xarelto, CKD stage III, chronic foley admitted on 04/14/2020 with acute respiratory distress. Patient found to be in septic shock secondary to UTI and community acquired pneumonia vs. Aspiration pneumonia. Also with AKI on CKD Stage III. Patient remains hypotensive despite IVF resuscitation started on levophed infusion. Wife confirms patient is a limited code, NO CPR, intubation, or defibrillation. She is agreeable with central line placement for vasopressor support. Palliative medicine consultation for goals of care.   Clinical Assessment and Goals of Care: PMT consult received, discussed with RN and Darel Hong PCCM NP, and visited patient at bedside. He is critically ill on BiPAP. He does wake to voice and denies pain for RN. He appears comfortable without increased work of breathing. Blood pressures remain low on levo infusion.   Met with family in ER waiting room. Wife, son, DIL, and grandchildren present.   Introduced role of palliative medicine.   Prior to admission, wife reports [name] was bedridden with chronic foley. Lives at home with family. He suffered a stroke about 16 years ago. Family shares he was critically ill with infection last year and pulled through.   Wife and family have a good understanding of his diagnoses, interventions, plan of care, and tenuous prognosis.   Wife confirms her decision for NO CPR, intubation/mechanical ventilation or defibrillation. She confirms her decision to continue with current plan of care including vasopressor  support. We discussed watchful waiting and time to see if his body will respond to blood pressure medicine, antibiotics, and fluids. Wife and family do seem prepared that he is very sick and could pass. They are at peace and decline chaplain support at this time.    We did discuss if he begins to show discomfort or suffering, recommendation for comfort focused pathway and consideration of medications to ensure he does not suffer. Family understands and open to this idea if he appears to be struggling.   Answered questions. PMT contact information given. Reassured of updates from care team regarding his condition.    SUMMARY OF RECOMMENDATIONS    Met with wife and multiple family members in waiting room.   Wife confirms decision for partial code. NO CPR, intubation/mechanical ventilation, or defibrillation.   Wife wishes to continue current plan of care and medical management including IVF, ABX, levo, and BiPAP. Watchful waiting. Time for outcomes. Family does seem to understand he is critically ill and may not survive this admission.   PMT provider returns to [facility] on Monday 1/17 and will f/u with patient and family if he survives the weekend.   Code Status/Advance Care Planning:  Partial code: NO CPR, intubation, or defibrillation  Symptom Management:   Per  attending  Palliative Prophylaxis:   Aspiration, Bowel Regimen, Delirium Protocol, Frequent Pain Assessment, Oral Care and Turn Reposition  Psycho-social/Spiritual:   Desire for further Chaplaincy support:yes  Additional Recommendations: Caregiving  Support/Resources and Compassionate Wean Education  Prognosis:   Very tenuous with septic shock, hypotension despite vasopressor support. Acute respiratory failure requiring BiPAP.  Discharge Planning: To Be Determined      Primary Diagnoses: Present on Admission: . HCAP (healthcare-associated pneumonia) . Severe sepsis with septic shock ([place]) . Acute renal failure  superimposed on stage 3b chronic kidney disease ([place]) . Atrial fibrillation with RVR ([place]) . Recurrent UTI . HTN (hypertension) . HLD (hyperlipidemia) . Stroke ([place]) . Iron deficiency anemia . Thrombocytopenia ([place])   I have reviewed the medical record, interviewed the patient and family, and examined the patient. The following aspects are pertinent.  Past Medical History:  Diagnosis Date  . A-fib ([place])    On Xarelto  . Chronic kidney disease (CKD)   . High cholesterol   . Hypertension   . Inguinal hernia   . Stroke [facility])    With left-sided hemiparesis  . Urinary retention    Chronic Foley catheter since December 2016   Social History   Socioeconomic History  . Marital status: Married    Spouse name: Not on file  . Number of children: 1  . Years of education: 4  . Highest education level: High school graduate  Occupational History  . Not on file  Tobacco Use  . Smoking status: Former Research scientist (life sciences)  . Smokeless tobacco: Never Used  . Tobacco comment: quit 50 years  Vaping Use  . Vaping Use: Never used  Substance and Sexual Activity  . Alcohol use: No    Alcohol/week: 0.0 standard drinks  . Drug use: No  . Sexual activity: Never  Other Topics Concern  . Not on file  Social History Narrative   Lives at home with his wife. Bed bound at baseline.   Social Determinants of Health   Financial Resource Strain: Not on file  Food Insecurity: Not on file  Transportation Needs: Not on file  Physical Activity: Not on file  Stress: Not on file  Social Connections: Not on file   Family History  Problem Relation Age of Onset  . Hypertension Father   . Kidney cancer Neg Hx   . Kidney disease Neg Hx   . Prostate cancer Neg Hx    Scheduled Meds: . atorvastatin  80 mg Oral QPM  . feeding supplement  237 mL Oral BID BM  . feeding supplement (PRO-STAT SUGAR FREE 64)  30 mL Oral BID BM  . ferrous sulfate  325 mg Oral BID WC  . heparin injection (subcutaneous)  5,000 Units  Subcutaneous Q8H  . ipratropium-albuterol  3 mL Nebulization Q4H  . multivitamin with minerals  1 tablet Oral Daily   Continuous Infusions: . sodium chloride Stopped (04/12/2020 0836)  . [START ON 2020-04-24] ceFEPime (MAXIPIME) IV    . lactated ringers 75 mL/hr at 04/26/2020 0810  . norepinephrine (LEVOPHED) Adult infusion 20 mcg/min (03/30/2020 1451)  . [START ON 04/11/2020] vancomycin     PRN Meds:.acetaminophen, acetaminophen, albuterol, dextromethorphan-guaiFENesin, food thickener, ondansetron (ZOFRAN) IV Medications Prior to Admission:  Prior to Admission medications   Medication Sig Start Date End Date Taking? Authorizing Provider  acetaminophen (TYLENOL) 325 MG tablet Take 650 mg by mouth every 4 (four) hours as needed. for pain/ increased temp. May be administered orally, per G-tube if needed or rectally if unable  to swallow (separate order). Maximum dose for 24 hours is 3,000 mg from all sources of Acetaminophen/ Tylenol   Yes [provider]  Amino Acids-Protein Hydrolys (FEEDING SUPPLEMENT, PRO-STAT SUGAR FREE 64,) LIQD Take 30 mLs by mouth 2 (two) times daily between meals.   Yes [provider]  atorvastatin (LIPITOR) 80 MG tablet Take 80 mg by mouth every evening.    Yes [provider]  carvedilol (COREG) 25 MG tablet Take 25 mg by mouth 2 (two) times daily. 03/11/20  Yes [provider]  docusate sodium (COLACE) 100 MG capsule Take 100 mg by mouth 2 (two) times daily.    Yes [provider]  ferrous sulfate 325 (65 FE) MG tablet Take 1 tablet (325 mg total) by mouth 2 (two) times daily with a meal. 03/04/15  Yes Dustin Flock, MD  food thickener (THICK IT) POWD Take by mouth. Use to mix thin liquids to nectar thick consistency as needed   Yes [provider]  [place] (DERMACLOUD) CREA Apply liberal amount topically to area of skin irritation prn. OK to leave at bedside.   Yes [provider]  Multiple Vitamin  (MULTIVITAMIN WITH MINERALS) TABS tablet Take 1 tablet by mouth daily.   Yes [provider]  Nutritional Supplements (ENSURE ENLIVE PO) Take 1 Bottle by mouth 3 (three) times daily. For 8 oz bottle, add 2 tablespoons of thickner. Stir for 15 seconds until dissolved.   Yes [provider]  Probiotic Product (RISA-BID PROBIOTIC) TABS Take 1 tablet by mouth 2 (two) times daily.   Yes [provider]  sacubitril-valsartan (ENTRESTO) 49-51 MG Take 1 tablet by mouth 2 (two) times daily. 04/16/17  Yes Loletha Grayer, MD   No Known Allergies Review of Systems  Unable to perform ROS: Acuity of condition   Physical Exam Vitals and nursing note reviewed.  Constitutional:      General: He is sleeping.     Comments: BiPAP  Cardiovascular:     Rate and Rhythm: Normal rate.  Pulmonary:     Effort: No tachypnea, accessory muscle usage or respiratory distress.     Comments: Appears comfortable on BiPAP Abdominal:     Tenderness: There is no abdominal tenderness.  Psychiatric:        Cognition and Memory: He does not exhibit impaired recent memory ( ).    Vital Signs: BP 92/62   Pulse (!) 109   Temp (!) 96.8 F (36 C) (Rectal)   Resp (!) 36   Ht '5\' 8"'  (1.727 m)   Wt 53.1 kg   SpO2 96%   BMI 17.79 kg/m        SpO2: SpO2: 96 % O2 Device:SpO2: 96 % O2 Flow Rate: .   IO: Intake/output summary:   Intake/Output Summary (Last 24 hours) at 04/25/2020 1504 Last data filed at 04/24/2020 5809 Gross per 24 hour  Intake 1600 ml  Output -  Net 1600 ml    LBM:   Baseline Weight: Weight: 53.1 kg Most recent weight: Weight: 53.1 kg     Palliative Assessment/Data: PPS 20%   Flowsheet Rows   Flowsheet Row Most Recent Value  Intake Tab   Referral Department Hospitalist  Unit at Time of Referral ER  Palliative Care Primary Diagnosis Sepsis/Infectious Disease  Palliative Care Type New Palliative care  Reason for referral Clarify Goals of Care  Date first seen by  Palliative Care 04/01/2020  Clinical Assessment   Palliative Performance Scale Score 20%  Psychosocial & Spiritual Assessment   Palliative Care Outcomes   Patient/Family meeting held? Yes  Who was at the meeting? wife, son, family  Palliative Care Outcomes Clarified goals of care, Provided end of life care assistance, Provided psychosocial or spiritual support, ACP counseling assistance     Time Total: 30 Greater than 50%  of this time was spent counseling and coordinating care related to the above assessment and plan.  Signed by:   Please contact Palliative Medicine Team phone at [phone] for questions and concerns.  For individual provider: See Shea Evans

## 2020-04-09 NOTE — ED Notes (Signed)
Dr. Clyde Lundborg aware of pt most recent  BP reading, orders placed by MD.

## 2020-04-09 NOTE — Sepsis Progress Note (Signed)
Following for sepsis monitoring ?

## 2020-04-09 NOTE — ED Triage Notes (Signed)
Pt presents to ER via ems from home.  Pt's wife called ems d/t pt being SOB.  Pt found with audible rales.  Pt stated he felt fluid overloaded.  Pt has hx of CHF, and stroke which affected left side.  Pt was initially 80% on RA and placed on Cpap with ems with improvement to 91% on Cpap.  Pt given 1.5 inches nitro paste en route.

## 2020-04-09 NOTE — Consult Note (Signed)
Pharmacy Antibiotic Note  Monique Gift is a 87 y.o. male with medical history including Afib, CKD, stroke, urinary retention with chronic foley catheter admitted on 04/22/2020 with sepsis. Patient BIBEMS 1/14 with respiratory distress. Pharmacy has been consulted for vancomycin and cefepime dosing.  Plan: Cefepime 2 g IV q24h  Vancomycin 1 g IV loading dose followed by maintenance dose of vancomycin 750 mg IV q48h --Predicted AUC: 556, Cmin 15 --Daily Scr per protocol while on vancomycin --Levels at steady state as indicated  Continue to monitor renal function and adjust antibiotics as indicated.   Height: 5\' 8"  (172.7 cm) Weight: 53.1 kg (117 lb) IBW/kg (Calculated) : 68.4  Temp (24hrs), Avg:99.5 F (37.5 C), Min:96.8 F (36 C), Max:102.1 F (38.9 C)  Recent Labs  Lab 04-22-2020 0349 04/22/2020 0822  WBC 5.8  --   CREATININE 2.98*  --   LATICACIDVEN 3.2* 3.9*    Estimated Creatinine Clearance: 12.4 mL/min (A) (by C-G formula based on SCr of 2.98 mg/dL (H)).    No Known Allergies  Antimicrobials this admission: Azithromycin 1/14 x 1 Cefepime 1/14 >>  Vancomycin 1/14 >>   Dose adjustments this admission: N/A  Microbiology results: 1/14 BCx: NGTD 1/14 UCx: pending  1/14 Sputum: pending  1/14 MRSA PCR: pending  Thank you for allowing pharmacy to be a part of this patient's care.  2/14 04-22-2020 9:48 AM

## 2020-04-09 NOTE — ED Notes (Signed)
Family at bedside. 

## 2020-04-09 NOTE — ED Notes (Signed)
Attempted x3 to collect repeat LA.  Will call lab to come collect blood.

## 2020-04-09 NOTE — Consult Note (Signed)
NAMLeticia Kline: Jordan Kline  DOB: 01-Oct-1927  MRN: [id number]  Date/Time: 04/03/2020 3:08 PM  REQUESTING PROVIDER: Clyde LundborgNiu Subjective:  REASON FOR CONSULT: e.coli bacteremia ?No history available from patient.  Chart reviewed. Jordan PennaDouglas Rios is a 87 y.o. with a history of A. fib, CKD, hyperlipidemia, hypertension, CVA, urinary retention with chronic Foley catheter Patient presented from home via EMS because of shortness of breath.  He was found to have emesis in his mouth.  EMS placed him on CPAP.  He was found to be in A. fib he is already on Xarelto. In the ED vitals BP 84/60, pulse 109, respiratory 44, sats 94%, temperature 102.1 and weight 217 pounds WBC was 5.8, hemoglobin 10.8, platelet 107 and creatinine 2.98. Blood cultures were sent Chest x-ray showed bilateral infiltrates Past Medical History:  Diagnosis Date  . A-fib (HCC)    On Xarelto  . Chronic kidney disease (CKD)   . High cholesterol   . Hypertension   . Inguinal hernia   . Stroke [facility](HCC)    With left-sided hemiparesis  . Urinary retention    Chronic Foley catheter since December 2016    Past Surgical History:  Procedure Laterality Date  . APPENDECTOMY    . PERIPHERAL VASCULAR CATHETERIZATION N/A 11/10/2015   Procedure: Visceral Angiography, mesenteric angio;  Surgeon: Annice NeedyJason S Dew, MD;  Location: [facility] INVASIVE CV LAB;  Service: Cardiovascular;  Laterality: N/A;  . PERIPHERAL VASCULAR CATHETERIZATION N/A 11/10/2015   Procedure: Visceral Artery Intervention;  Surgeon: Annice NeedyJason S Dew, MD;  Location: [facility] INVASIVE CV LAB;  Service: Cardiovascular;  Laterality: N/A;    Social History   Socioeconomic History  . Marital status: Married    Spouse name: Not on file  . Number of children: 1  . Years of education: 2812  . Highest education level: High school graduate  Occupational History  . Not on file  Tobacco Use  . Smoking status: Former Games developermoker  . Smokeless tobacco: Never Used  . Tobacco comment: quit 50 years  Vaping Use  .  Vaping Use: Never used  Substance and Sexual Activity  . Alcohol use: No    Alcohol/week: 0.0 standard drinks  . Drug use: No  . Sexual activity: Never  Other Topics Concern  . Not on file  Social History Narrative   Lives at home with his wife. Bed bound at baseline.   Social Determinants of Health   Financial Resource Strain: Not on file  Food Insecurity: Not on file  Transportation Needs: Not on file  Physical Activity: Not on file  Stress: Not on file  Social Connections: Not on file  Intimate Partner Violence: Not on file    Family History  Problem Relation Age of Onset  . Hypertension Father   . Kidney cancer Neg Hx   . Kidney disease Neg Hx   . Prostate cancer Neg Hx    No Known Allergies  ? Current Facility-Administered Medications  Medication Dose Route Frequency Provider Last Rate Last Admin  . 0.9 %  sodium chloride infusion  250 mL Intravenous Continuous Lorretta HarpNiu, Xilin, MD   Held at 03/29/2020 [phone]  . acetaminophen (TYLENOL) suppository 650 mg  650 mg Rectal Q6H PRN Lorretta HarpNiu, Xilin, MD      . acetaminophen (TYLENOL) tablet 650 mg  650 mg Oral Q6H PRN Lorretta HarpNiu, Xilin, MD      . albuterol (PROVENTIL) (2.5 MG/3ML) 0.083% nebulizer solution 2.5 mg  2.5 mg Nebulization Q4H PRN Lorretta HarpNiu, Xilin, MD      . atorvastatin (  LIPITOR) tablet 80 mg  80 mg Oral QPM Lorretta Harp, MD      . Melene Muller ON 04/05/2020] ceFEPIme (MAXIPIME) 2 g in sodium chloride 0.9 % 100 mL IVPB  2 g Intravenous Q24H Tressie Ellis, RPH      . dextromethorphan-guaiFENesin (MUCINEX DM) 30-600 MG per 12 hr tablet 1 tablet  1 tablet Oral BID PRN Lorretta Harp, MD      . feeding supplement (ENSURE ENLIVE / ENSURE PLUS) liquid 237 mL  237 mL Oral BID BM Lorretta Harp, MD      . feeding supplement (PRO-STAT SUGAR FREE 64) liquid 30 mL  30 mL Oral BID BM Lorretta Harp, MD      . ferrous sulfate tablet 325 mg  325 mg Oral BID WC Lorretta Harp, MD      . food thickener (THICK IT) powder   Oral PRN Lorretta Harp, MD      . heparin injection 5,000  Units  5,000 Units Subcutaneous Q8H Harlon Ditty D, NP      . ipratropium-albuterol (DUONEB) 0.5-2.5 (3) MG/3ML nebulizer solution 3 mL  3 mL Nebulization Q4H Lorretta Harp, MD      . lactated ringers infusion   Intravenous Continuous Lorretta Harp, MD 75 mL/hr at April 18, 2020 0810 Rate Change at 04-18-2020 0810  . multivitamin with minerals tablet 1 tablet  1 tablet Oral Daily Lorretta Harp, MD      . norepinephrine (LEVOPHED) 4mg  in premix infusion  2-10 mcg/min Intravenous Titrated , MD 75 mL/hr at April 18, 2020 1451 20 mcg/min at 04/18/2020 1451  . ondansetron (ZOFRAN) injection 4 mg  4 mg Intravenous Q8H PRN 04/11/20, MD      . Lorretta Harp ON 04/11/2020] vancomycin (VANCOREADY) IVPB 750 mg/150 mL  750 mg Intravenous Q48H 04/13/2020, [facility]       Current Outpatient Medications  Medication Sig Dispense Refill  . acetaminophen (TYLENOL) 325 MG tablet Take 650 mg by mouth every 4 (four) hours as needed. for pain/ increased temp. May be administered orally, per G-tube if needed or rectally if unable to swallow (separate order). Maximum dose for 24 hours is 3,000 mg from all sources of Acetaminophen/ Tylenol    . Amino Acids-Protein Hydrolys (FEEDING SUPPLEMENT, PRO-STAT SUGAR FREE 64,) LIQD Take 30 mLs by mouth 2 (two) times daily between meals.    [facility] atorvastatin (LIPITOR) 80 MG tablet Take 80 mg by mouth every evening.     . carvedilol (COREG) 25 MG tablet Take 25 mg by mouth 2 (two) times daily.    Marland Kitchen docusate sodium (COLACE) 100 MG capsule Take 100 mg by mouth 2 (two) times daily.     . ferrous sulfate 325 (65 FE) MG tablet Take 1 tablet (325 mg total) by mouth 2 (two) times daily with a meal. 60 tablet 0  . food thickener (THICK IT) POWD Take by mouth. Use to mix thin liquids to nectar thick consistency as needed    . Infant Care Products (DERMACLOUD) CREA Apply liberal amount topically to area of skin irritation prn. OK to leave at bedside.    . Multiple Vitamin (MULTIVITAMIN WITH MINERALS) TABS  tablet Take 1 tablet by mouth daily.    . Nutritional Supplements (ENSURE ENLIVE PO) Take 1 Bottle by mouth 3 (three) times daily. For 8 oz bottle, add 2 tablespoons of thickner. Stir for 15 seconds until dissolved.    . Probiotic Product (RISA-BID PROBIOTIC) TABS Take 1 tablet by mouth 2 (two) times daily.    Marland Kitchen  sacubitril-valsartan (ENTRESTO) 49-51 MG Take 1 tablet by mouth 2 (two) times daily. 60 tablet 0     Abtx:  Anti-infectives (From admission, onward)   Start     Dose/Rate Route Frequency Ordered Stop   04/11/20 0800  vancomycin (VANCOREADY) IVPB 750 mg/150 mL        750 mg 150 mL/hr over 60 Minutes Intravenous Every 48 hours 03/11/2021 0958     04/15/2020 0600  ceFEPIme (MAXIPIME) 2 g in sodium chloride 0.9 % 100 mL IVPB        2 g 200 mL/hr over 30 Minutes Intravenous Every 24 hours 03/11/2021 0958     03/11/2021 0400  vancomycin (VANCOCIN) IVPB 1000 mg/200 mL premix        1,000 mg 200 mL/hr over 60 Minutes Intravenous  Once 03/11/2021 0347 03/11/2021 0809   03/11/2021 0400  ceFEPIme (MAXIPIME) 2 g in sodium chloride 0.9 % 100 mL IVPB        2 g 200 mL/hr over 30 Minutes Intravenous  Once 03/11/2021 0347 03/11/2021 0453   03/11/2021 0400  azithromycin (ZITHROMAX) 500 mg in sodium chloride 0.9 % 250 mL IVPB        500 mg 250 mL/hr over 60 Minutes Intravenous  Once 03/11/2021 0347 03/11/2021 0642      REVIEW OF SYSTEMS:  NA Objective:  VITALS:  BP (!) 68/52   Pulse 100   Temp (!) 96.8 F (36 C) (Rectal)   Resp 12   Ht 5\' 8"  (1.727 m)   Wt 53.1 kg   SpO2 (!) 89%   BMI 17.79 kg/m  PHYSICAL EXAM:  General: Obtunded 100% nonrebreather Head: Normocephalic, without obvious abnormality, atraumatic. Eyes: Cannot examine ENT unable to examine Neck: , symmetrical, no adenopathy, thyroid: non tender no carotid bruit and no JVD.  Lungs: Bilateral air entry crepitus bilaterally Heart: Irregular Abdomen: Soft, non-tender,not distended. Bowel sounds normal. No masses Foley  catheter Extremities: Right fourth toe ischemia Lymph: Cervical, supraclavicular normal. Neurologic: Unable to examine.  But has wasting on the left side Pertinent Labs Lab Results CBC    Component Value Date/Time   WBC 5.8 2021/01/30 0349   RBC 3.35 (L) 2021/01/30 0349   HGB 10.8 (L) 2021/01/30 0349   HCT 34.0 (L) 2021/01/30 0349   PLT 107 (L) 2021/01/30 0349   MCV 101.5 (H) 2021/01/30 0349   MCH 32.2 2021/01/30 0349   MCHC 31.8 2021/01/30 0349   RDW 14.6 2021/01/30 0349   LYMPHSABS 0.4 (L) 2021/01/30 0349   MONOABS 0.2 2021/01/30 0349   EOSABS 0.0 2021/01/30 0349   BASOSABS 0.0 2021/01/30 0349    CMP Latest Ref Rng & Units 01/13/2021 05/19/2019 05/18/2019  Glucose 70 - 99 mg/dL 962(X124(H) 528(U102(H) 132(G114(H)  BUN 8 - 23 mg/dL 40(N69(H) 02(V59(H) 25(D71(H)  Creatinine 0.61 - 1.24 mg/dL 6.64(Q2.98(H) 0.34(V1.62(H) 4.25(Z1.75(H)  Sodium 135 - 145 mmol/L 140 147(H) 149(H)  Potassium 3.5 - 5.1 mmol/L 4.3 3.5 3.5  Chloride 98 - 111 mmol/L 107 117(H) 119(H)  CO2 22 - 32 mmol/L 22 26 26   Calcium 8.9 - 10.3 mg/dL 5.6(L8.7(L) 8.7(F8.2(L) 6.4(P8.2(L)  Total Protein 6.5 - 8.1 g/dL 3.2(R5.7(L) - -  Total Bilirubin 0.3 - 1.2 mg/dL 0.7 - -  Alkaline Phos 38 - 126 U/L 84 - -  AST 15 - 41 U/L 28 - -  ALT 0 - 44 U/L 20 - -      Microbiology: Recent Results (from the past 240 hour(s))  Blood Culture (routine x 2)  Status: None (Preliminary result)   Collection Time: April 20, 2020  3:49 AM   Specimen: BLOOD  Result Value Ref Range Status   Specimen Description BLOOD RIGHT [facility]  Final   Special Requests   Final    BOTTLES DRAWN AEROBIC AND ANAEROBIC Blood Culture adequate volume   Culture  Setup Time   Final    Organism ID to follow GRAM NEGATIVE RODS IN BOTH AEROBIC AND ANAEROBIC BOTTLES CRITICAL RESULT CALLED TO, READ BACK BY AND VERIFIED WITH: CARISSA DOLAN ON 04/20/2020 AT 1346 QSD Performed at [facility], [address], [place], [place] [address]    Culture GRAM NEGATIVE RODS  Final   Report Status PENDING  Incomplete  Blood  Culture ID Panel (Reflexed)     Status: Abnormal   Collection Time: Apr 20, 2020  3:49 AM  Result Value Ref Range Status   Enterococcus faecalis NOT DETECTED NOT DETECTED Final   Enterococcus Faecium NOT DETECTED NOT DETECTED Final   Listeria monocytogenes NOT DETECTED NOT DETECTED Final   Staphylococcus species NOT DETECTED NOT DETECTED Final   Staphylococcus aureus (BCID) NOT DETECTED NOT DETECTED Final   Staphylococcus epidermidis NOT DETECTED NOT DETECTED Final   Staphylococcus lugdunensis NOT DETECTED NOT DETECTED Final   Streptococcus species NOT DETECTED NOT DETECTED Final   Streptococcus agalactiae NOT DETECTED NOT DETECTED Final   Streptococcus pneumoniae NOT DETECTED NOT DETECTED Final   Streptococcus pyogenes NOT DETECTED NOT DETECTED Final   A.calcoaceticus-baumannii NOT DETECTED NOT DETECTED Final   Bacteroides fragilis NOT DETECTED NOT DETECTED Final   Enterobacterales DETECTED (A) NOT DETECTED Final    Comment: Enterobacterales represent a large order of gram negative bacteria, not a single organism. CRITICAL RESULT CALLED TO, READ BACK BY AND VERIFIED WITH: CARISSA DOLAN ON 04-20-20 AT 1346 QSD    Enterobacter cloacae complex NOT DETECTED NOT DETECTED Final   Escherichia coli DETECTED (A) NOT DETECTED Final    Comment: CRITICAL RESULT CALLED TO, READ BACK BY AND VERIFIED WITH: CARISSA DOLAN ON 20-Apr-2020 AT 1346 QSD    Klebsiella aerogenes NOT DETECTED NOT DETECTED Final   Klebsiella oxytoca NOT DETECTED NOT DETECTED Final   Klebsiella pneumoniae NOT DETECTED NOT DETECTED Final   Proteus species NOT DETECTED NOT DETECTED Final   Salmonella species NOT DETECTED NOT DETECTED Final   Serratia marcescens NOT DETECTED NOT DETECTED Final   Haemophilus influenzae NOT DETECTED NOT DETECTED Final   Neisseria meningitidis NOT DETECTED NOT DETECTED Final   Pseudomonas aeruginosa NOT DETECTED NOT DETECTED Final   Stenotrophomonas maltophilia NOT DETECTED NOT DETECTED Final   Candida  albicans NOT DETECTED NOT DETECTED Final   Candida auris NOT DETECTED NOT DETECTED Final   Candida glabrata NOT DETECTED NOT DETECTED Final   Candida krusei NOT DETECTED NOT DETECTED Final   Candida parapsilosis NOT DETECTED NOT DETECTED Final   Candida tropicalis NOT DETECTED NOT DETECTED Final   Cryptococcus neoformans/gattii NOT DETECTED NOT DETECTED Final   CTX-M ESBL NOT DETECTED NOT DETECTED Final   Carbapenem resistance IMP NOT DETECTED NOT DETECTED Final   Carbapenem resistance KPC NOT DETECTED NOT DETECTED Final   Carbapenem resistance NDM NOT DETECTED NOT DETECTED Final   Carbapenem resist OXA 48 LIKE NOT DETECTED NOT DETECTED Final   Carbapenem resistance VIM NOT DETECTED NOT DETECTED Final    Comment: Performed at [facility], [address] [place], [place] [address]  Blood Culture (routine x 2)     Status: None (Preliminary result)   Collection Time: 2020/04/20  4:12  AM   Specimen: BLOOD  Result Value Ref Range Status   Specimen Description BLOOD RIGHT ANTECUBITAL  Final   Special Requests   Final    BOTTLES DRAWN AEROBIC AND ANAEROBIC Blood Culture adequate volume   Culture  Setup Time   Final    GRAM NEGATIVE RODS IN BOTH AEROBIC AND ANAEROBIC BOTTLES CRITICAL VALUE NOTED.  VALUE IS CONSISTENT WITH PREVIOUSLY REPORTED AND CALLED VALUE. Performed at [facility], [address] [place], [place] [address]    Culture GRAM NEGATIVE RODS  Final   Report Status PENDING  Incomplete  Resp Panel by RT-PCR (Flu A&B, Covid) Nasopharyngeal Swab     Status: None   Collection Time: April 27, 2020  4:12 AM   Specimen: Nasopharyngeal Swab; Nasopharyngeal(NP) swabs in vial transport medium  Result Value Ref Range Status   SARS Coronavirus 2 by RT PCR NEGATIVE NEGATIVE Final    Comment: (NOTE) SARS-CoV-2 target nucleic acids are NOT DETECTED.  The SARS-CoV-2 RNA is generally detectable in upper respiratory specimens during the acute phase of infection. The  lowest concentration of SARS-CoV-2 viral copies this assay can detect is 138 copies/mL. A negative result does not preclude SARS-Cov-2 infection and should not be used as the sole basis for treatment or other patient management decisions. A negative result may occur with  improper specimen collection/handling, submission of specimen other than nasopharyngeal swab, presence of viral mutation(s) within the areas targeted by this assay, and inadequate number of viral copies(<138 copies/mL). A negative result must be combined with clinical observations, patient history, and epidemiological information. The expected result is Negative.  Fact Sheet for Patients:  BloggerCourse.com  Fact Sheet for Healthcare Providers:  SeriousBroker.it  This test is no t yet approved or cleared by the [place] FDA and  has been authorized for detection and/or diagnosis of SARS-CoV-2 by FDA under an Emergency Use Authorization (EUA). This EUA will remain  in effect (meaning this test can be used) for the duration of the COVID-19 declaration under Section 564(b)(1) of the Act, 21 U.S.C.section 360bbb-3(b)(1), unless the authorization is terminated  or revoked sooner.       Influenza A by PCR NEGATIVE NEGATIVE Final   Influenza B by PCR NEGATIVE NEGATIVE Final    Comment: (NOTE) The Xpert Xpress SARS-CoV-2/FLU/RSV plus assay is intended as an aid in the diagnosis of influenza from Nasopharyngeal swab specimens and should not be used as a sole basis for treatment. Nasal washings and aspirates are unacceptable for Xpert Xpress SARS-CoV-2/FLU/RSV testing.  Fact Sheet for Patients: BloggerCourse.com  Fact Sheet for Healthcare Providers: SeriousBroker.it  This test is not yet approved or cleared by the [place] FDA and has been authorized for detection and/or diagnosis of SARS-CoV-2 by FDA under  an Emergency Use Authorization (EUA). This EUA will remain in effect (meaning this test can be used) for the duration of the COVID-19 declaration under Section 564(b)(1) of the Act, 21 U.S.C. section 360bbb-3(b)(1), unless the authorization is terminated or revoked.  Performed at [facility], [address] [place], [place] [address]     IMAGING RESULTS:  I have personally reviewed the films ? Impression/Recommendation  ?Septic shock secondary to E. coli On double pressors   E. coli bacteremia.  Rule out urinary tract infection.  Patient has a Foley catheter in place. Urine culture pending .  We will get renal ultrasound to look for hydronephrosis Currently on Vanco and cefepime. changed to ceftriaxone.  Added Flagyl for possible aspiration.  ?Acute  hypoxic respiratory failure.  Bilateral pulmonary infiltrates.  aspiration versus CHF  On 100% oxygen by nonrebreather  Altered mental status.  Not sure what his baseline is AKI on CKD.  We will get renal ultrasound  A. fib with RVR   History of CVA left hemiplegia Patient critically ill.  Prognosis guarded.  Discussed with care team. Tried to contact wife but unable to reach her. ID will follow her peripherally this weekend.  Call if needed.    Note:  This document was prepared using Dragon voice recognition software and may include unintentional dictation errors.

## 2020-04-09 NOTE — Progress Notes (Signed)
CODE SEPSIS - PHARMACY COMMUNICATION  **Broad Spectrum Antibiotics should be administered within 1 hour of Sepsis diagnosis**  Time Code Sepsis Called/Page Received: 1694  Antibiotics Ordered: Cefepime and Vancomycin  Time of 1st antibiotic administration: 0420  Additional action taken by pharmacy: n/a  If necessary, Name of Provider/Nurse Contacted: n/a   Wayland Denis ,PharmD Clinical Pharmacist  2020/04/17  4:43 AM

## 2020-04-09 NOTE — Procedures (Signed)
Central Venous Catheter Insertion Procedure Note  Jordan Kline  [id number]  Apr 13, 1927  Date:04/04/2020  Time:3:26 PM   Provider Performing:Greco Gastelum D Elvina Sidle   Procedure: Insertion of Non-tunneled Central Venous [phone]) with US guidance ([address])   Indication(s) Medication administration and Difficult access  Consent Risks of the procedure as well as the alternatives and risks of each were explained to the patient and/or caregiver.  Consent for the procedure was obtained and is signed in the bedside chart  Anesthesia Topical only with 1% lidocaine   Timeout Verified patient identification, verified procedure, site/side was marked, verified correct patient position, special equipment/implants available, medications/allergies/relevant history reviewed, required imaging and test results available.  Sterile Technique Maximal sterile technique including full sterile barrier drape, hand hygiene, sterile gown, sterile gloves, mask, hair covering, sterile ultrasound probe cover (if used).  Procedure Description Area of catheter insertion was cleaned with chlorhexidine and draped in sterile fashion.  With real-time ultrasound guidance a central venous catheter was placed into the right femoral vein. Nonpulsatile blood flow and easy flushing noted in all ports.  The catheter was sutured in place and sterile dressing applied.  Complications/Tolerance None; patient tolerated the procedure well. Chest X-ray is ordered to verify placement for internal jugular or subclavian cannulation.   Chest x-ray is not ordered for femoral cannulation.  EBL Minimal  Specimen(s) None   Line was secured at the 20 cm mark   BIOPATCH was applied to the insertion site.    Harlon Ditty, AGACNP-BC [facility] Pager: [phone]

## 2020-04-10 ENCOUNTER — Inpatient Hospital Stay
Admit: 2020-04-10 | Discharge: 2020-04-10 | Disposition: A | Discharge status: Home / Self Care | Payer: HMO | Attending: Internal Medicine | Admitting: Internal Medicine

## 2020-04-10 ENCOUNTER — Inpatient Hospital Stay: Payer: HMO

## 2020-04-10 DIAGNOSIS — A419 Sepsis, unspecified organism: Secondary | ICD-10-CM | POA: Diagnosis present

## 2020-04-10 DIAGNOSIS — J189 Pneumonia, unspecified organism: Secondary | ICD-10-CM | POA: Diagnosis not present

## 2020-04-10 DIAGNOSIS — N390 Urinary tract infection, site not specified: Secondary | ICD-10-CM

## 2020-04-10 DIAGNOSIS — R6520 Severe sepsis without septic shock: Secondary | ICD-10-CM

## 2020-04-10 DIAGNOSIS — J9601 Acute respiratory failure with hypoxia: Secondary | ICD-10-CM | POA: Diagnosis not present

## 2020-04-10 LAB — ECHOCARDIOGRAM COMPLETE
AR max vel: 2.02 cm2
AV Area VTI: 2.13 cm2
AV Area mean vel: 1.71 cm2
AV Mean grad: 1 mmHg
AV Peak grad: 1.4 mmHg
Ao pk vel: 0.59 m/s
Area-P 1/2: 5.58 cm2
Height: 68 in
S' Lateral: 3.85 cm
Weight: 1872 oz

## 2020-04-10 LAB — BLOOD GAS, ARTERIAL
Acid-base deficit: 11.4 mmol/L — ABNORMAL HIGH (ref 0.0–2.0)
Acid-base deficit: 9.1 mmol/L — ABNORMAL HIGH (ref 0.0–2.0)
Bicarbonate: 17 mmol/L — ABNORMAL LOW (ref 20.0–28.0)
Bicarbonate: 15.5 mmol/L — ABNORMAL LOW (ref 20.0–28.0)
Delivery systems: POSITIVE
Expiratory PAP: 8
FIO2: 1
Inspiratory PAP: 16
O2 Saturation: 58.2 %
O2 Saturation: 99.8 %
Patient temperature: 37
Patient temperature: 37
pCO2 arterial: 50 mmHg — ABNORMAL HIGH (ref 32.0–48.0)
pCO2 arterial: 28 mmHg — ABNORMAL LOW (ref 32.0–48.0)
pH, Arterial: 7.14 — CL (ref 7.350–7.450)
pH, Arterial: 7.35 (ref 7.350–7.450)
pO2, Arterial: 41 mmHg — ABNORMAL LOW (ref 83.0–108.0)
pO2, Arterial: 224 mmHg — ABNORMAL HIGH (ref 83.0–108.0)

## 2020-04-10 LAB — LACTIC ACID, PLASMA
Lactic Acid, Venous: 4.1 mmol/L (ref 0.5–1.9)
Lactic Acid, Venous: 5.5 mmol/L (ref 0.5–1.9)
Lactic Acid, Venous: 5.2 mmol/L (ref 0.5–1.9)

## 2020-04-10 LAB — RENAL FUNCTION PANEL
Albumin: 1.9 g/dL — ABNORMAL LOW (ref 3.5–5.0)
Anion gap: 14 (ref 5–15)
BUN: 91 mg/dL — ABNORMAL HIGH (ref 8–23)
CO2: 17 mmol/L — ABNORMAL LOW (ref 22–32)
Calcium: 7.2 mg/dL — ABNORMAL LOW (ref 8.9–10.3)
Chloride: 107 mmol/L (ref 98–111)
Creatinine, Ser: 4.45 mg/dL — ABNORMAL HIGH (ref 0.61–1.24)
GFR, Estimated: 12 mL/min — ABNORMAL LOW (ref >60)
Glucose, Bld: 137 mg/dL — ABNORMAL HIGH (ref 70–99)
Phosphorus: 8 mg/dL — ABNORMAL HIGH (ref 2.5–4.6)
Potassium: 6.7 mmol/L (ref 3.5–5.1)
Sodium: 138 mmol/L (ref 135–145)

## 2020-04-10 LAB — CBC WITH DIFFERENTIAL/PLATELET
Abs Immature Granulocytes: 0.34 10*3/uL — ABNORMAL HIGH (ref 0.00–0.07)
Basophils Absolute: 0 10*3/uL (ref 0.0–0.1)
Basophils Relative: 0 %
Eosinophils Absolute: 0 10*3/uL (ref 0.0–0.5)
Eosinophils Relative: 0 %
HCT: 26.7 % — ABNORMAL LOW (ref 39.0–52.0)
Hemoglobin: 8.5 g/dL — ABNORMAL LOW (ref 13.0–17.0)
Immature Granulocytes: 2 %
Lymphocytes Relative: 3 %
Lymphs Abs: 0.6 10*3/uL — ABNORMAL LOW (ref 0.7–4.0)
MCH: 32.8 pg (ref 26.0–34.0)
MCHC: 31.8 g/dL (ref 30.0–36.0)
MCV: 103.1 fL — ABNORMAL HIGH (ref 80.0–100.0)
Monocytes Absolute: 1 10*3/uL (ref 0.1–1.0)
Monocytes Relative: 5 %
Neutro Abs: 19.4 10*3/uL — ABNORMAL HIGH (ref 1.7–7.7)
Neutrophils Relative %: 90 %
Platelets: 73 10*3/uL — ABNORMAL LOW (ref 150–400)
RBC: 2.59 MIL/uL — ABNORMAL LOW (ref 4.22–5.81)
RDW: 14.9 % (ref 11.5–15.5)
WBC: 21.5 10*3/uL — ABNORMAL HIGH (ref 4.0–10.5)
nRBC: 0 % (ref 0.0–0.2)

## 2020-04-10 LAB — CORTISOL: Cortisol, Plasma: >100 ug/dL

## 2020-04-10 LAB — HEMOGLOBIN A1C
Hgb A1c MFr Bld: 4.9 % (ref 4.8–5.6)
Mean Plasma Glucose: 93.93 mg/dL

## 2020-04-10 LAB — CBG MONITORING, ED
Glucose-Capillary: 64 mg/dL — ABNORMAL LOW (ref 70–99)
Glucose-Capillary: <10 mg/dL — CL (ref 70–99)
Glucose-Capillary: 169 mg/dL — ABNORMAL HIGH (ref 70–99)

## 2020-04-10 LAB — GLUCOSE, CAPILLARY: Glucose-Capillary: 125 mg/dL — ABNORMAL HIGH (ref 70–99)

## 2020-04-10 LAB — MRSA PCR SCREENING: MRSA by PCR: NEGATIVE

## 2020-04-10 LAB — PROCALCITONIN: Procalcitonin: >150 ng/mL

## 2020-04-10 MED ORDER — POLYVINYL ALCOHOL 1.4 % OP SOLN
1.0000 [drp] | Freq: Four times a day (QID) | OPHTHALMIC | Status: DC | PRN
  Filled 2020-04-10: qty 15

## 2020-04-10 MED ORDER — SODIUM CHLORIDE 0.9 % IV SOLN
1.0000 g | INTRAVENOUS | Status: DC
  Filled 2020-04-10: qty 1

## 2020-04-10 MED ORDER — LACTATED RINGERS IV SOLN: INTRAVENOUS | Status: DC

## 2020-04-10 MED ORDER — GLYCOPYRROLATE 0.2 MG/ML IJ SOLN: 0.2000 mg | INTRAMUSCULAR | Status: DC | PRN

## 2020-04-10 MED ORDER — SODIUM BICARBONATE 8.4 % IV SOLN: 100.0000 meq | Freq: Once | INTRAVENOUS | Status: DC

## 2020-04-10 MED ORDER — DIPHENHYDRAMINE HCL 50 MG/ML IJ SOLN: 25.0000 mg | INTRAMUSCULAR | Status: DC | PRN

## 2020-04-10 MED ORDER — DEXTROSE 5 % IV SOLN: INTRAVENOUS | Status: DC

## 2020-04-10 MED ORDER — DEXTROSE 50 % IV SOLN: 25.0000 mL | Freq: Once | INTRAVENOUS | Status: AC

## 2020-04-10 MED ORDER — MORPHINE SULFATE (PF) 2 MG/ML IV SOLN: 2.0000 mg | INTRAVENOUS | Status: DC | PRN

## 2020-04-10 MED ORDER — CHLORHEXIDINE GLUCONATE CLOTH 2 % EX PADS: 6.0000 | MEDICATED_PAD | Freq: Every day | CUTANEOUS | Status: DC

## 2020-04-10 MED ORDER — NOREPINEPHRINE 4 MG/250ML-% IV SOLN
INTRAVENOUS | Status: AC
  Filled 2020-04-10: qty 250

## 2020-04-10 MED ORDER — MORPHINE 100MG IN NS 100ML (1MG/ML) PREMIX INFUSION
0.0000 mg/h | INTRAVENOUS | Status: DC
  Administered 2020-04-10: 5 mg/h via INTRAVENOUS
  Filled 2020-04-10: qty 100

## 2020-04-10 MED ORDER — DEXTROSE 50 % IV SOLN
1.0000 | Freq: Once | INTRAVENOUS | Status: AC
  Administered 2020-04-10: 50 mL via INTRAVENOUS

## 2020-04-10 MED ORDER — GLYCOPYRROLATE 1 MG PO TABS
1.0000 mg | ORAL_TABLET | ORAL | Status: DC | PRN
  Filled 2020-04-10: qty 1

## 2020-04-10 MED ORDER — DEXTROSE IN LACTATED RINGERS 5 % IV SOLN: INTRAVENOUS | Status: DC

## 2020-04-10 MED ORDER — DEXTROSE 50 % IV SOLN
INTRAVENOUS | Status: AC
  Administered 2020-04-10: 25 mL via INTRAVENOUS
  Filled 2020-04-10: qty 50

## 2020-04-10 MED ORDER — SODIUM BICARBONATE 8.4 % IV SOLN
INTRAVENOUS | Status: DC
  Filled 2020-04-10: qty 850
  Filled 2020-04-10: qty 150
  Filled 2020-04-10: qty 850

## 2020-04-10 MED ORDER — MORPHINE BOLUS VIA INFUSION
5.0000 mg | INTRAVENOUS | Status: DC | PRN
  Filled 2020-04-10: qty 5

## 2020-04-10 MED ORDER — SODIUM CHLORIDE 0.9 % IV SOLN
1.0000 g | Freq: Two times a day (BID) | INTRAVENOUS | Status: DC
  Filled 2020-04-10 (×2): qty 1

## 2020-04-11 LAB — URINE CULTURE: Culture: >=100000 — AB

## 2020-04-12 LAB — CULTURE, BLOOD (ROUTINE X 2)
Special Requests: ADEQUATE
Special Requests: ADEQUATE

## 2020-04-13 ENCOUNTER — Other Ambulatory Visit: Payer: Medicare HMO | Admitting: Nurse Practitioner

## 2020-04-13 LAB — LEGIONELLA PNEUMOPHILA SEROGP 1 UR AG: L. pneumophila Serogp 1 Ur Ag: NEGATIVE

## 2020-04-15 LAB — GLUCOSE, CAPILLARY: Glucose-Capillary: <10 mg/dL — CL (ref 70–99)

## 2020-04-27 NOTE — ED Notes (Addendum)
  Patient was noted with a distended abdomen and very little urine output for the shift.  Obtained verbal order for bladder scan and it showed >300 ml of urine.  Attempted to irrigate foley but only minimal a minimal amount of urine returned.    Notified Britton NP and received verbal order to remove foley and insert a temp foley.

## 2020-04-27 NOTE — Progress Notes (Incomplete)
Neuro: alert but not interactive, does not follow commands, does not show signs of understanding questioning Resp: on bipap, shallow and tachypneic CV: afebrile, weak pulses, decreased capillary refill, cool to the touch GIGU: NPO, no BM, foley catheter in place, possible penial tissue damage due to chronic catheter use,  Skin: scattered bruising, poor skin care, deep tissue injury/stage 2 wound on sacrum/buttock, L 3rd toe deep tissue injury, scrotum has very thin skin, possible superficial skin tear vs. moisture associated skin damage Social: Family is at the bedside this evening to discuss with MD possible transition to comfort care.  Events: ED admit this afternoon.

## 2020-04-27 NOTE — ED Notes (Signed)
New bag of Levo being sent by pharmacy

## 2020-04-27 NOTE — ED Notes (Signed)
Date and time results received: 04-20-2020 12:50 PM Test: CBG Critical Value: "LO", then "5"   Name of Provider Notified: Margo Aye DO  Another half amp of D50% was given after 64 (previously 1amp for "LO")

## 2020-04-27 NOTE — Progress Notes (Addendum)
Patient's wife and son had discussed with Dr. Karna Christmas the patient's deteriorating condition.  At that time the decision was made to move towards comfort measures.  However additional family wanted to come bedside to be part of the end-of-life process.  A follow up Goals of Care discussion was had with family bedside, including the patient's wife and son. All family that wanted to be present for comfort measure initiation has arrived. We discussed the overall process. That I would be initiating a morphine drip to keep the patient comfortable, and all vasopressors would be discontinued. All questions answered, family remains bedside- chaplain services offered, but declined.   Cheryll Cockayne Rust-Chester, AGACNP-BC Acute Care Nurse Practitioner [facility]   [phone] / [phone] Please see Amion for pager details.

## 2020-04-27 NOTE — Progress Notes (Signed)
Pharmacy Antibiotic Note  Jordan Kline is a 87 y.o. male admitted on 2020-04-20 with Severe Sepsis and acute renal failure superimposed on CKD.   ID is following and started patient on ceftriaxone and Flagyl on 1/14 for E.Coli bacteremia and possible aspiration. Attending physician consulted pharmacy for antibiotic change to meropenem 1/15.   Plan: Will start meropenem 1g IV every 12 hours for CrCl 10-30 ml/min.   Pharmacy will monitor renal function and adjust dose as needed.   Height: 5\' 8"  (172.7 cm) Weight: 53.1 kg (117 lb) IBW/kg (Calculated) : 68.4  Temp (24hrs), Avg:97.4 F (36.3 C), Min:96.8 F (36 C), Max:97.7 F (36.5 C)  Recent Labs  Lab 20-Apr-2020 0349 2020/04/20 0822 04/20/20 1958 04/03/2020 0229  WBC 5.8  --   --  21.5*  CREATININE 2.98*  --   --   --   LATICACIDVEN 3.2* 3.9* 4.0* 4.1*    Estimated Creatinine Clearance: 12.4 mL/min (A) (by C-G formula based on SCr of 2.98 mg/dL (H)).    No Known Allergies  Antimicrobials this admission: 1/14 azithromycin/cefepime/vancomycin >> x1 1/14 Ceftriaxone/Flagyl >> 1/15 1/15 meropenem>>   Microbiology results: 1/14 BCID: E. Coli  1/14  UCx: pending 1/14 MRSA PCR: negative   Thank you for allowing pharmacy to be a part of this patient's care.  2/14, PharmD, BCPS Clinical Pharmacist 04/20/2020 10:43 AM

## 2020-04-27 NOTE — Progress Notes (Signed)
*  PRELIMINARY RESULTS* Echocardiogram 2D Echocardiogram has been performed.  Cristela Blue 05-10-20, 2:57 PM

## 2020-04-27 NOTE — Progress Notes (Signed)
NAME:  Jordan Kline, MRN:  [id number], DOB:  1927-07-17, LOS: 1 ADMISSION DATE:  04/08/2020, CONSULTATION DATE:  04/24/2020 REFERRING MD:  Dr. Blaine Hamper, CHIEF COMPLAINT:  Acute Respiratory Distress, Hypoxia  Brief History:  87 y.o. Male with a PMH significant for HFpEF, A. Fib on Xarelto, CKD III, chronic foley, stroke with Left hemiparesis admitted 04/16/2020 with Septic shock in the setting of UTI & Community Acquired Pneumonia vs. Aspiration Pneumonia, along with AKI superimposed on CKD Stage III.  Background:  Jordan Kline is a 87 year old male with a past medical history significant for HFpEF, hypertension, hyperlipidemia, stroke with left hemiparesis, atrial fibrillation (previously on Xarelto), chronic Foley, C. difficile, GI bleed who presents to [facility] ED on 04/21/2020 due to complaints of shortness of breath.  Patiently is currently on BiPAP and is a poor historian, therefore history is obtained from wife and ED and nursing notes.  Of note his chronic foley catheter was changed out 2 days PTA.  Patient presented with acute respiratory failure due to pneumonia requiring BiPAP.  ED course: Upon arrival to the ED he was noted to be in severe respiratory distress and unable to speak in full sentences. Vital signs included: Temperature 102.1 F, respiratory rate 40, pulse 116, blood pressure 101/74.  He was immediately started on BiPAP.  He was also noted to become hypotensive with blood pressure 84/64 of which he was given 1.5 L of IV fluids.  Despite IV fluid resuscitation he remained hypotensive with blood pressure 77/53, of which Levophed infusion was started.    Chest x-ray with bilateral airspace opacities with the right greater than left, concerning for pneumonia.  Urinalysis is consistent with urinary tract infection.  His COVID-19 PCR and influenza PCR are both negative.  Lab work is notable for BNP 2221, lactic acid 3.2, WBC 5.8 with lymphocytopenia and moderate left shift, hemoglobin 10.8,  platelets 107, BUN 69, creatinine 2.98, glucose 124.  EKG with atrial fibrillation and nonspecific ST abnormalities.  He met sepsis criteria therefore he received IV fluid resuscitation and broad-spectrum antibiotics with azithromycin, cefepime, and vancomycin.  PCCM is asked to see the patient for further work-up and treatment of septic shock in the setting of UTI and community-acquired pneumonia versus aspiration pneumonia, along with AKI superimposed on CKD Stage III.  His wife confirms he is a LIMITED CODE, agreeable only to vasoactive medications (no CPR, no intubation, no defibrillation).  His wife is agreeable to vasopressors and central line placement.  Problems:  HFpEF Atrial Fibrillation on Xarelto CKD III Urinary retention with chronic foley Stroke Hyperlipidemia Hypertension  Significant Hospital Events:  1/14: Admission to ICU 1/15: Remains pressor dependent and BiPAP dependent  Consults:  Hospitalist PCCM  Procedures:  Right femoral central line 1/14  Significant Diagnostic Tests:  1/14: CXR>>Bilateral airspace opacities, right greater than left concerning for pneumonia.  Micro Data:  1/14: SARS-CoV-2 PCR>>negative 1/14: Influenza A&B PCR>>negative 1/14: Blood culture x2>> E. coli 1/14: Urine>> 1/14: Sputum>> 1/14: Strep pneumo urinary antigen>> 1/14: Legionella urinary antigen>>  Antimicrobials:  Azithromycin x1 dose 1/14 Cefepime 1/14>> 1/15 Meropenem >>1/15 Vancomycin 1/14>>1/15  Interim History / Subjective:  Pt on BiPAP and tolerating Requiring 20 mcg Levophed to maintain MAP >65 On vasopressin and Neo-Synephrine as well Confused Granddaughter at bedside, updated  Objective   Blood pressure 94/68, pulse 77, temperature (!) 97.5 F (36.4 C), resp. rate (!) 32, height '5\' 8"'  (1.727 m), weight 53.1 kg, SpO2 90 %.    FiO2 (%):  [50 %-100 %] 100 %  Intake/Output Summary (Last 24 hours) at May 01, 2020 1129 Last data filed at 2020/05/01 0731 Gross  per 24 hour  Intake 763.18 ml  Output 500 ml  Net 263.18 ml   Filed Weights   04/08/2020 0332  Weight: 53.1 kg    Examination: General: Acute on chronically ill appearing male, supine in bed, on BiPAP, no increased work of breathing HENT: Atraumatic, bitemporal wasting, neck supple, no JVD Lungs: Coarse rhonchi bilaterally, no wheezing, Bipap assisted, even Cardiovascular: Tachycardia, regular rhythm, frequent extrasystoles, 1+ distal pulses Abdomen: Soft, nontender, nondistended, no guarding or rebound tenderness, BS+ x4 Extremities: Left Hemiparesis (baseline), moderate strength to RUE & RLE, Trace edema to bilateral LE Neuro: Awake and alert, appears confused Skin: Warm and dry.  No obvious rashes, lesions, or ulcerations  Resolved Hospital Problem list   N/A  Assessment & Plan:   Septic Shock Atrial Fibrillation Chronic HFpEF without acute exacerbation Hx: HFpEF, HLD, A. Fib on Xarelto (wife reports he hasn't been on since over the summer) -Continuous cardiac monitoring -Maintain MAP >65 -Received IV fluid resuscitation in ED -Vasopressors as needed to maintain MAP goal -No diuresis due to hypotension and AKI -Trend lactic acid -Check High sensitivity Troponin>>95 -Check serum Cortisol -On stress dose steroids -Consider repeat Echocardiogram (most recent ECHO 05/15/19 with LVEF 50 to 55%, indeterminate Diastolic parameters, RV systolic function normal, Severely elevated pulmonary artery pressure, moderate to severe mitral valve regurgitation, moderate to severe Tricuspid valve regurgitation) -Bicarbonate drip for metabolic acidosis -Prognosis guarded, palliative care following   Severe Sepsis in the setting of UTI & Community Acquired Pneumonia vs. Aspiration Pneumonia -Monitor fever curve -Trend WBC's and Procalcitonin -E. coli bacteremia -On meropenem, Vanco and cefepime discontinued -Follow sensitivities   Acute Hypoxic Respiratory Failure in the setting of CAP  vs. Aspiration -Supplemental O2 as needed to maintain O2 sats >92% -Currently on BiPAP, wean as tolerated -Pt is DNI -Follow intermittent CXR & ABG as needed -Continue Cefepime and Vancomycin for now -Prn Bronchodilators   AKI superimposed on CKD III Metabolic acidosis -Monitor I&O's / urinary output -Follow BMP -Ensure adequate renal perfusion -Avoid nephrotoxic agents as able -Replace electrolytes as indicated -Bicarb infusion   Anemia without s/sx of Bleeding Thrombocytopenia, suspect in setting of Sepsis -Monitor for S/Sx of bleeding -Trend CBC -SQ Heparin for VTE Prophylaxis  -Transfuse for Hgb <7 -Transfuse platelets for platelet count <50 and active bleeding   Best practice (evaluated daily)  Diet: NPO while on BiPAP Pain/Anxiety/Delirium protocol (if indicated): N/A VAP protocol (if indicated): N/A DVT prophylaxis: Heparin SQ GI prophylaxis: N/A Glucose control: N/A Mobility: Bedrest Disposition:ICU  Goals of Care:  Last date of multidisciplinary goals of care discussion:04/03/2020 Family and staff present: Pt's  granddaughter updated at bedside, care coordination performed with bedside RN Summary of discussion: Septic shock due to PNA and UTI requiring vasopressors, will need central line Follow up goals of care discussion due: 05/01/2020, done, continue CODE STATUS as below Code Status: Verified with wife, Limited Code (only Vasoactive drugs, NO CPR, INTUBATION, DEFIBRILLATION, ACLS DRUGS)  Labs   CBC: Recent Labs  Lab 03/29/2020 0349 2020/05/01 0229  WBC 5.8 21.5*  NEUTROABS 5.2 19.4*  HGB 10.8* 8.5*  HCT 34.0* 26.7*  MCV 101.5* 103.1*  PLT 107* 73*    Basic Metabolic Panel: Recent Labs  Lab 03/27/2020 0349  NA 140  K 4.3  CL 107  CO2 22  GLUCOSE 124*  BUN 69*  CREATININE 2.98*  CALCIUM 8.7*   GFR: Estimated Creatinine Clearance: 12.4  mL/min (A) (by C-G formula based on SCr of 2.98 mg/dL (H)). Recent Labs  Lab 03/28/2020 0349 04/24/2020 0822  03/31/2020 1958 Apr 24, 2020 0229  PROCALCITON  --  11.76  --  >150.00  WBC 5.8  --   --  21.5*  LATICACIDVEN 3.2* 3.9* 4.0* 4.1*    Liver Function Tests: Recent Labs  Lab 04/22/2020 0349  AST 28  ALT 20  ALKPHOS 84  BILITOT 0.7  PROT 5.7*  ALBUMIN 2.8*   No results for input(s): LIPASE, AMYLASE in the last 168 hours. No results for input(s): AMMONIA in the last 168 hours.  ABG    Component Value Date/Time   PHART 7.14 (LL) April 24, 2020 0903   PCO2ART 50 (H) Apr 24, 2020 0903   PO2ART 41 (L) 24-Apr-2020 0903   HCO3 17.0 (L) Apr 24, 2020 0903   ACIDBASEDEF 11.4 (H) 04/24/2020 0903   O2SAT 58.2 04/24/2020 0903     Coagulation Profile: Recent Labs  Lab 04/08/2020 0349  INR 1.2    Cardiac Enzymes: No results for input(s): CKTOTAL, CKMB, CKMBINDEX, TROPONINI in the last 168 hours.  HbA1C: Hgb A1c MFr Bld  Date/Time Value Ref Range Status  11/08/2015 04:28 PM  4.0 - 6.0 % Final   UNABLE TO REPORT A1C DUE TO UNKNOWN INTERFERING FACTOR CAUSING THE ANALYTICAL RANGE TO BE OUTSIDE OF ANALYZER RANGE.  SAMPLE SENT TO [facility] FOR AN ALTERNATIVE METHOD.    CBG: No results for input(s): GLUCAP in the last 168 hours.  Review of Systems:   Positives in BOLD: Pt denies all complaints currently Gen: Denies fever, chills, weight change, fatigue, night sweats HEENT: Denies blurred vision, double vision, hearing loss, tinnitus, sinus congestion, rhinorrhea, sore throat, neck stiffness, dysphagia PULM: Denies shortness of breath, cough, sputum production, hemoptysis, wheezing CV: Denies chest pain, edema, orthopnea, paroxysmal nocturnal dyspnea, palpitations GI: Denies abdominal pain, nausea, vomiting, diarrhea, hematochezia, melena, constipation, change in bowel habits GU: Denies dysuria, hematuria, polyuria, oliguria, urethral discharge Endocrine: Denies hot or cold intolerance, polyuria, polyphagia or appetite change Derm: Denies rash, dry skin, scaling or peeling skin change Heme: Denies  easy bruising, bleeding, bleeding gums Neuro: Denies headache, numbness, weakness, slurred speech, loss of memory or consciousness   Past Medical History:  He,  has a past medical history of A-fib ([place]), Chronic kidney disease (CKD), High cholesterol, Hypertension, Inguinal hernia, Stroke ([place]), and Urinary retention.   Surgical History:   Past Surgical History:  Procedure Laterality Date  . APPENDECTOMY    . PERIPHERAL VASCULAR CATHETERIZATION N/A 11/10/2015   Procedure: Visceral Angiography, mesenteric angio;  Surgeon: Algernon Huxley, MD;  Location: [place] [facility];  Service: Cardiovascular;  Laterality: N/A;  . PERIPHERAL VASCULAR CATHETERIZATION N/A 11/10/2015   Procedure: Visceral Artery Intervention;  Surgeon: Algernon Huxley, MD;  Location: [place] [facility];  Service: Cardiovascular;  Laterality: N/A;     Social History:   reports that he has quit smoking. He has never used smokeless tobacco. He reports that he does not drink alcohol and does not use drugs.   Family History:  His family history includes Hypertension in his father. There is no history of Kidney cancer, Kidney disease, or Prostate cancer.   Allergies No Known Allergies   Home Medications  Prior to Admission medications   Medication Sig Start Date End Date Taking? Authorizing Provider  acetaminophen (TYLENOL) 325 MG tablet Take 650 mg by mouth every 4 (four) hours as needed. for pain/ increased temp. May be administered orally, per G-tube if needed or  rectally if unable to swallow (separate order). Maximum dose for 24 hours is 3,000 mg from all sources of Acetaminophen/ Tylenol   Yes [provider]  Amino Acids-Protein Hydrolys (FEEDING SUPPLEMENT, PRO-STAT SUGAR FREE 64,) LIQD Take 30 mLs by mouth 2 (two) times daily between meals.   Yes [provider]  atorvastatin (LIPITOR) 80 MG tablet Take 80 mg by mouth every evening.    Yes [provider]  carvedilol (COREG) 25 MG tablet Take  25 mg by mouth 2 (two) times daily. 03/11/20  Yes [provider]  docusate sodium (COLACE) 100 MG capsule Take 100 mg by mouth 2 (two) times daily.    Yes [provider]  ferrous sulfate 325 (65 FE) MG tablet Take 1 tablet (325 mg total) by mouth 2 (two) times daily with a meal. 03/04/15  Yes Dustin Flock, MD  food thickener (THICK IT) POWD Take by mouth. Use to mix thin liquids to nectar thick consistency as needed   Yes [provider]  [place] (DERMACLOUD) CREA Apply liberal amount topically to area of skin irritation prn. OK to leave at bedside.   Yes [provider]  Multiple Vitamin (MULTIVITAMIN WITH MINERALS) TABS tablet Take 1 tablet by mouth daily.   Yes [provider]  Nutritional Supplements (ENSURE ENLIVE PO) Take 1 Bottle by mouth 3 (three) times daily. For 8 oz bottle, add 2 tablespoons of thickner. Stir for 15 seconds until dissolved.   Yes [provider]  Probiotic Product (RISA-BID PROBIOTIC) TABS Take 1 tablet by mouth 2 (two) times daily.   Yes [provider]  sacubitril-valsartan (ENTRESTO) 49-51 MG Take 1 tablet by mouth 2 (two) times daily. 04/16/17  Yes Loletha Grayer, MD     Critical care time: 45 minutes    C. Derrill Kay, MD [place] [facility]   *This note was dictated using voice recognition software/Dragon.  Despite best efforts to proofread, errors can occur which can change the meaning.  Any change was purely unintentional.

## 2020-04-27 NOTE — Progress Notes (Signed)
PROGRESS NOTE  Jordan Kline DXI:[id number] DOB: [birth year] DOA: 04-11-2020 PCP: Sherron Monday, MD  HPI/Recap of past 24 hours: HPI: Jordan Kline is a 87 y.o. male with medical history significant of dCHF, HTN, HLD, stroke, permanent A fib, CKD-IIIb, chronic foley, C diff, GIB, presents with SOB.   Per his son and wife (I called family by phone), patient developed shortness of breath since yesterday, which has been progressively worsening.  Patient also has fever and chills.  His body temperature is 102.1 in ED. patient has cough, but no chest pain.  He has nausea and vomited once at home, currently no nausea, vomiting, diarrhea or abdominal pain.  Patient has a chronic Foley catheter since 2016, which was changed 2 days ago per family.  Patient was found to have oxygen desaturation to 80% on RA and placed on CPAP by EMS, with improvement to 91% on CPAP. Pt was given 1.5 inches nitro paste en route. Pt has severe respiratory distress at arrival, cannot speak in full sentence.  BiPAP was started in ED. Patient has persistent hypotension with blood pressure 84/64, which improved initially to 102/71 after giving 1.75 L of IV fluid (1.5 L LR, 250 cc of NS), but then decreased to 77/53. IV Levophed was started. currently pt is in comatose status, not arousable, not following command.  Addendum: blood culture came back positive for GNR with E. Coli on BCID  ED Course:  On presentation, WBC 5.8K, BNP 2221, negative COVID PCR, lactic acid 3.2, INR 1.2, PTT 28, positive urinalysis (therapeutic appearance, large amount of leukocyte, many bacteria, WBC> 50), worsening renal function, temperature 102.1, heart rate 116, 45, chest x-ray showed bilateral infiltration (Right is worse than the left). Pt is admitted to stepdown as inpatient.  Dr. Oley Balm of ICU is consulted.  Worsening blood pressure, now in septic shock and on 3 vasopressors to maintain MAP greater than 65.  Worsening respiratory  status despite BiPAP.  Unresponsive.  Order to transfer to ICU in place.  04/20/2020: Patient was seen and examined at bedside in the ED.  He is nonresponsive.  He is currently on 3 pressors to maintain his MAP greater than 65.  He is also on BiPAP with FiO2 of 100%.  VBG revealed pH 7.1 and PCO2 of 50.  Ordered 2 A of bicarb to be administered.  Patient has been transferred to ICU to continue vasopressors.  PCCM attending Dr. Karna Christmas.  Wife and only child, his son, have both been made aware of his critical condition.  They stated they are on their way to the hospital.   Assessment/Plan: Principal Problem:   HCAP (healthcare-associated pneumonia) Active Problems:   Recurrent UTI   Acute renal failure superimposed on stage 3b chronic kidney disease (HCC)   Atrial fibrillation with RVR (HCC)   Chronic CHF (HCC)   Severe sepsis with septic shock (HCC)   HTN (hypertension)   HLD (hyperlipidemia)   Stroke (HCC)   Iron deficiency anemia   Thrombocytopenia (HCC)   Bacteremia due to Gram-negative bacteria   Septic shock (HCC)  Septic shock secondary to E. coli bacteremia, CAP, UTI, all present on admission. Presented with high-grade fever, hypotension, and tachycardia Blood culture drawn on 04/09/2021 positive for E. coli, awaiting sensitivity Was started on Rocephin and IV Flagyl on admission Switched to meropenem on 03/30/2020 Currently on 3 vasopressors to maintain MAP greater than 65 Hypovolemic on exam started gentle IV fluid hydration LR at 50cc/hr x1 day Evidence of community-acquired pneumonia, with  bilateral pulmonary infiltrates on chest x-ray, personally reviewed.  Procalcitonin greater than 150 on 04/06/2020, lactic acid 4.1. Order to transfer to ICU in place. PCCM attending made aware.  Severe acute hypoxic hypercarbic respiratory failure secondary to CAP and acute on chronic dCHF Currently requiring BiPAP with FiO2 of 100%. Patient is limited code, DO NOT INTUBATE and do not  perform chest compressions, confirmed by his wife via phone on 04/04/2020. Presented with pulmonary infiltrates on CXR and elevated BNP greater than 2000 Close monitoring while on gentle IV fluid nutrition, hypovolemic on exam with lactic acid of 4.1. Meropenem per pharmacy dosage  Acute on chronic diastolic CHF Last 2D echo done on 05/15/2019 showed normal LVEF 50 to 55%, left atrial size and right atrial size severely dilated. Presented with elevated BNP greater than 2000 Close monitoring while on gentle IV fluid nutrition, hypovolemic on exam with lactic acid of 4.1.  AKI on CKD 3B Baseline creatinine appears to be 1.6 with GFR 33 Presented with creatinine of 2.98 with GFR 19 Monitor UO Avoid nephrotoxins and hypotension  Elevated troponin likely demand ischemia in the setting of severe hypoxia Trop S 95 Afib on 12 lead EKG with no specific ST T changes Closely monitor in ICU unit Needs repeat 2D echo  Goals of care Poor prognosis.  Family made aware. Limited code- Do not intubate.  No CPR per his wife on 04/08/2020 via phone.  Palliative care following   Code Status: Limited code, DO NOT INTUBATE, no CPR.  Family Communication: Updated the wife and son via phone on 04/17/2020.  Disposition Plan: Transfer to ICU.   Consultants:  PCCM    Antimicrobials:  Meropenem from 04/03/2020  DVT prophylaxis: Subcu heparin 3 times daily  Status is: Inpatient    Dispo: The patient is from: Home.               Anticipated d/c is to: Unknown at this time.              Anticipated d/c date is: 04/13/2020              Patient currently not stable for discharge due to severe acute hypoxic and hypercarbic respiratory failure, septic shock        Objective: Vitals:   04/08/2020 0900 04/25/2020 0915 04/06/2020 0930 04/24/2020 0949  BP: (!) 72/60 93/81 94/68    Pulse:  77    Resp: (!) 27 (!) 30 (!) 32   Temp: (!) 97.4 F (36.3 C) (!) 97.5 F (36.4 C) (!) 97.5 F (36.4 C)    TempSrc:      SpO2:    90%  Weight:      Height:        Intake/Output Summary (Last 24 hours) at 03/27/2020 1017 Last data filed at 03/27/2020 0731 Gross per 24 hour  Intake 763.18 ml  Output 500 ml  Net 263.18 ml   Filed Weights   April 10, 2020 0332  Weight: 53.1 kg    Exam:  . General: 86 y.o. year-old male chronically ill-appearing. Unresponsive. . Cardiovascular: Irregular rate and rhythm with no rubs or gallops.  No thyromegaly or JVD noted.   Marland Kitchen Respiratory: Diffuse rales bilaterally.  Poor inspiratory effort.  On BiPAP.   Marland Kitchen Abdomen: Soft nontender nondistended with normal bowel sounds. . Musculoskeletal: No lower extremity edema bilaterally.   . Skin: No ulcerative lesions noted or rashes . Psychiatry: Unable to assess mood due to unresponsiveness.   Data Reviewed: CBC: Recent Labs  Lab  04/13/2020 0349 2020-05-05 0229  WBC 5.8 21.5*  NEUTROABS 5.2 19.4*  HGB 10.8* 8.5*  HCT 34.0* 26.7*  MCV 101.5* 103.1*  PLT 107* 73*   Basic Metabolic Panel: Recent Labs  Lab 04/20/2020 0349  NA 140  K 4.3  CL 107  CO2 22  GLUCOSE 124*  BUN 69*  CREATININE 2.98*  CALCIUM 8.7*   GFR: Estimated Creatinine Clearance: 12.4 mL/min (A) (by C-G formula based on SCr of 2.98 mg/dL (H)). Liver Function Tests: Recent Labs  Lab 04/16/2020 0349  AST 28  ALT 20  ALKPHOS 84  BILITOT 0.7  PROT 5.7*  ALBUMIN 2.8*   No results for input(s): LIPASE, AMYLASE in the last 168 hours. No results for input(s): AMMONIA in the last 168 hours. Coagulation Profile: Recent Labs  Lab 04/19/2020 0349  INR 1.2   Cardiac Enzymes: No results for input(s): CKTOTAL, CKMB, CKMBINDEX, TROPONINI in the last 168 hours. BNP (last 3 results) No results for input(s): PROBNP in the last 8760 hours. HbA1C: No results for input(s): HGBA1C in the last 72 hours. CBG: No results for input(s): GLUCAP in the last 168 hours. Lipid Profile: No results for input(s): CHOL, HDL, LDLCALC, TRIG, CHOLHDL, LDLDIRECT  in the last 72 hours. Thyroid Function Tests: No results for input(s): TSH, T4TOTAL, FREET4, T3FREE, THYROIDAB in the last 72 hours. Anemia Panel: No results for input(s): VITAMINB12, FOLATE, FERRITIN, TIBC, IRON, RETICCTPCT in the last 72 hours. Urine analysis:    Component Value Date/Time   COLORURINE YELLOW (A) 04/05/2020 0349   APPEARANCEUR TURBID (A) 04/07/2020 0349   LABSPEC 1.014 04/13/2020 0349   PHURINE 5.0 03/29/2020 0349   GLUCOSEU NEGATIVE 03/29/2020 0349   HGBUR LARGE (A) 04/22/2020 0349   BILIRUBINUR NEGATIVE 04/11/2020 0349   KETONESUR NEGATIVE 04/26/2020 0349   PROTEINUR 100 (A) 03/27/2020 0349   NITRITE NEGATIVE 04/25/2020 0349   LEUKOCYTESUR LARGE (A) 04/08/2020 0349   Sepsis Labs: @LABRCNTIP (procalcitonin:4,lacticidven:4)  ) Recent Results (from the past 240 hour(s))  Blood Culture (routine x 2)     Status: None (Preliminary result)   Collection Time: 04/02/2020  3:49 AM   Specimen: BLOOD  Result Value Ref Range Status   Specimen Description BLOOD RIGHT [facility]  Final   Special Requests   Final    BOTTLES DRAWN AEROBIC AND ANAEROBIC Blood Culture adequate volume   Culture  Setup Time   Final    Organism ID to follow GRAM NEGATIVE RODS IN BOTH AEROBIC AND ANAEROBIC BOTTLES CRITICAL RESULT CALLED TO, READ BACK BY AND VERIFIED WITH: CARISSA DOLAN ON 04/03/2020 AT 1346 QSD Performed at [facility], [address] [place], [place]    Culture GRAM NEGATIVE RODS  Final   Report Status PENDING  Incomplete  Blood Culture ID Panel (Reflexed)     Status: Abnormal   Collection Time: 04/17/2020  3:49 AM  Result Value Ref Range Status   Enterococcus faecalis NOT DETECTED NOT DETECTED Final   Enterococcus Faecium NOT DETECTED NOT DETECTED Final   Listeria monocytogenes NOT DETECTED NOT DETECTED Final   Staphylococcus species NOT DETECTED NOT DETECTED Final   Staphylococcus aureus (BCID) NOT DETECTED NOT DETECTED Final   Staphylococcus epidermidis NOT  DETECTED NOT DETECTED Final   Staphylococcus lugdunensis NOT DETECTED NOT DETECTED Final   Streptococcus species NOT DETECTED NOT DETECTED Final   Streptococcus agalactiae NOT DETECTED NOT DETECTED Final   Streptococcus pneumoniae NOT DETECTED NOT DETECTED Final   Streptococcus pyogenes NOT DETECTED NOT DETECTED Final  A.calcoaceticus-baumannii NOT DETECTED NOT DETECTED Final   Bacteroides fragilis NOT DETECTED NOT DETECTED Final   Enterobacterales DETECTED (A) NOT DETECTED Final    Comment: Enterobacterales represent a large order of gram negative bacteria, not a single organism. CRITICAL RESULT CALLED TO, READ BACK BY AND VERIFIED WITH: CARISSA DOLAN ON 04/19/2020 AT 1346 QSD    Enterobacter cloacae complex NOT DETECTED NOT DETECTED Final   Escherichia coli DETECTED (A) NOT DETECTED Final    Comment: CRITICAL RESULT CALLED TO, READ BACK BY AND VERIFIED WITH: CARISSA DOLAN ON 04/25/2020 AT 1346 QSD    Klebsiella aerogenes NOT DETECTED NOT DETECTED Final   Klebsiella oxytoca NOT DETECTED NOT DETECTED Final   Klebsiella pneumoniae NOT DETECTED NOT DETECTED Final   Proteus species NOT DETECTED NOT DETECTED Final   Salmonella species NOT DETECTED NOT DETECTED Final   Serratia marcescens NOT DETECTED NOT DETECTED Final   Haemophilus influenzae NOT DETECTED NOT DETECTED Final   Neisseria meningitidis NOT DETECTED NOT DETECTED Final   Pseudomonas aeruginosa NOT DETECTED NOT DETECTED Final   Stenotrophomonas maltophilia NOT DETECTED NOT DETECTED Final   Candida albicans NOT DETECTED NOT DETECTED Final   Candida auris NOT DETECTED NOT DETECTED Final   Candida glabrata NOT DETECTED NOT DETECTED Final   Candida krusei NOT DETECTED NOT DETECTED Final   Candida parapsilosis NOT DETECTED NOT DETECTED Final   Candida tropicalis NOT DETECTED NOT DETECTED Final   Cryptococcus neoformans/gattii NOT DETECTED NOT DETECTED Final   CTX-M ESBL NOT DETECTED NOT DETECTED Final   Carbapenem resistance IMP NOT  DETECTED NOT DETECTED Final   Carbapenem resistance KPC NOT DETECTED NOT DETECTED Final   Carbapenem resistance NDM NOT DETECTED NOT DETECTED Final   Carbapenem resist OXA 48 LIKE NOT DETECTED NOT DETECTED Final   Carbapenem resistance VIM NOT DETECTED NOT DETECTED Final    Comment: Performed at [facility], [address] [place], [place] [address]  Blood Culture (routine x 2)     Status: None (Preliminary result)   Collection Time: 04/07/2020  4:12 AM   Specimen: BLOOD  Result Value Ref Range Status   Specimen Description BLOOD RIGHT ANTECUBITAL  Final   Special Requests   Final    BOTTLES DRAWN AEROBIC AND ANAEROBIC Blood Culture adequate volume   Culture  Setup Time   Final    GRAM NEGATIVE RODS IN BOTH AEROBIC AND ANAEROBIC BOTTLES CRITICAL VALUE NOTED.  VALUE IS CONSISTENT WITH PREVIOUSLY REPORTED AND CALLED VALUE. Performed at [facility], [address] [place], [place] [address]    Culture GRAM NEGATIVE RODS  Final   Report Status PENDING  Incomplete  Resp Panel by RT-PCR (Flu A&B, Covid) Nasopharyngeal Swab     Status: None   Collection Time: 04/23/2020  4:12 AM   Specimen: Nasopharyngeal Swab; Nasopharyngeal(NP) swabs in vial transport medium  Result Value Ref Range Status   SARS Coronavirus 2 by RT PCR NEGATIVE NEGATIVE Final    Comment: (NOTE) SARS-CoV-2 target nucleic acids are NOT DETECTED.  The SARS-CoV-2 RNA is generally detectable in upper respiratory specimens during the acute phase of infection. The lowest concentration of SARS-CoV-2 viral copies this assay can detect is 138 copies/mL. A negative result does not preclude SARS-Cov-2 infection and should not be used as the sole basis for treatment or other patient management decisions. A negative result may occur with  improper specimen collection/handling, submission of specimen other than nasopharyngeal swab, presence of viral mutation(s) within the areas targeted by this assay, and  inadequate number of viral copies(<138 copies/mL). A negative result must be combined with clinical observations, patient history, and epidemiological information. The expected result is Negative.  Fact Sheet for Patients:  BloggerCourse.comhttps://www.fda.gov/media/152166/download  Fact Sheet for Healthcare Providers:  SeriousBroker.ithttps://www.fda.gov/media/152162/download  This test is no t yet approved or cleared by the [place] FDA and  has been authorized for detection and/or diagnosis of SARS-CoV-2 by FDA under an Emergency Use Authorization (EUA). This EUA will remain  in effect (meaning this test can be used) for the duration of the COVID-19 declaration under Section 564(b)(1) of the Act, 21 U.S.C.section 360bbb-3(b)(1), unless the authorization is terminated  or revoked sooner.       Influenza A by PCR NEGATIVE NEGATIVE Final   Influenza B by PCR NEGATIVE NEGATIVE Final    Comment: (NOTE) The Xpert Xpress SARS-CoV-2/FLU/RSV plus assay is intended as an aid in the diagnosis of influenza from Nasopharyngeal swab specimens and should not be used as a sole basis for treatment. Nasal washings and aspirates are unacceptable for Xpert Xpress SARS-CoV-2/FLU/RSV testing.  Fact Sheet for Patients: BloggerCourse.comhttps://www.fda.gov/media/152166/download  Fact Sheet for Healthcare Providers: SeriousBroker.ithttps://www.fda.gov/media/152162/download  This test is not yet approved or cleared by the [place] FDA and has been authorized for detection and/or diagnosis of SARS-CoV-2 by FDA under an Emergency Use Authorization (EUA). This EUA will remain in effect (meaning this test can be used) for the duration of the COVID-19 declaration under Section 564(b)(1) of the Act, 21 U.S.C. section 360bbb-3(b)(1), unless the authorization is terminated or revoked.  Performed at [facility], [address] [place], [place] [address]   MRSA PCR Screening     Status: None   Collection Time: 04/01/2020  9:58 AM   Specimen:  Nasopharyngeal  Result Value Ref Range Status   MRSA by PCR NEGATIVE NEGATIVE Final    Comment:        The GeneXpert MRSA Assay (FDA approved for NASAL specimens only), is one component of a comprehensive MRSA colonization surveillance program. It is not intended to diagnose MRSA infection nor to guide or monitor treatment for MRSA infections. Performed at [facility], [address] [place], [place] [address]       Studies: DG CHEST PORT 1 VIEW  Result Date: 15-Feb-2021 CLINICAL DATA:  Acute respiratory failure EXAM: PORTABLE CHEST 1 VIEW COMPARISON:  April 09, 2020 FINDINGS: The heart size and mediastinal contours are unchanged with mild cardiomegaly. Aortic knob calcifications are seen. There is slight interval worsening in the hazy airspace opacities within the left perihilar and right lung base. There is retrocardiac opacity. Probable small bilateral pleural effusions are seen IMPRESSION: Interval slight worsening in the bilateral airspace opacities. Small bilateral pleural effusions Electronically Signed   By: Jonna ClarkBindu  Avutu M.D.   On: 022-Nov-2022 03:42   DG Chest Portable 1 View  Result Date: 04/18/2020 CLINICAL DATA:  Internal jugular catheter insertion attempt. EXAM: PORTABLE CHEST 1 VIEW COMPARISON:  04/06/2020 at 0336 hours FINDINGS: The cardiac silhouette is borderline enlarged. Aortic atherosclerosis is noted. There is increased density in the right lung base which may reflect a combination of airspace opacity and a new small pleural effusion. Patchy bilateral airspace opacities elsewhere are similar to the prior study. No pneumothorax is identified. IMPRESSION: Increased right basilar density which may reflect worsening pneumonia or atelectasis and a small pleural effusion. Unchanged bilateral airspace disease elsewhere. No pneumothorax. Electronically Signed   By: Sebastian AcheAllen  Grady M.D.   On: 04/05/2020 14:47    Scheduled Meds: .  atorvastatin  80 mg Oral QPM  .  feeding supplement  237 mL Oral BID BM  . feeding supplement (PRO-STAT SUGAR FREE 64)  30 mL Oral BID BM  . ferrous sulfate  325 mg Oral BID WC  . heparin injection (subcutaneous)  5,000 Units Subcutaneous Q8H  . hydrocortisone sod succinate (SOLU-CORTEF) inj  50 mg Intravenous Q6H  . ipratropium-albuterol  3 mL Nebulization Q4H  . multivitamin with minerals  1 tablet Oral Daily  . sodium bicarbonate  100 mEq Intravenous Once    Continuous Infusions: . sodium chloride Stopped (04/05/2020 0836)  . cefTRIAXone (ROCEPHIN)  IV Stopped (April 20, 2020 0302)  . lactated ringers 50 mL/hr at April 20, 2020 0907  . metronidazole Stopped (04-20-20 0731)  . norepinephrine (LEVOPHED) Adult infusion 22 mcg/min (04/20/2020 0901)  . phenylephrine (NEO-SYNEPHRINE) Adult infusion 200 mcg/min (2020-04-20 0901)  . vasopressin 0.03 Units/min (04/20/2020 0224)     LOS: 1 day     Darlin Drop, MD [facility] Pager [phone]  If 7PM-7AM, please contact night-coverage [email or web address] Password [facility] 04/20/2020, 10:17 AM

## 2020-04-27 NOTE — ED Notes (Addendum)
Date and time results received: May 07, 2020 12:15 Test: CBG Critical Value: "LO"  Name of Provider Notified: aleskerov MD  Gave one amp D50%

## 2020-04-27 NOTE — ED Notes (Signed)
MD were updated of hypoglycemic episode.

## 2020-04-27 NOTE — ED Notes (Signed)
Multiple location changes of pulse ox probe, unable to get consistently good pleth wave. Pulse ox reading 70%-80%'s w/o good wave form. Pt remains alert, eyes open, and able to nod head/answer yes/no questions

## 2020-04-27 NOTE — Progress Notes (Signed)
Pharmacy Antibiotic Note  Jordan Kline is a 87 y.o. male admitted on 2020-04-16 with Severe Sepsis and acute renal failure superimposed on CKD.   ID is following and started patient on ceftriaxone and Flagyl on 1/14 for E.Coli bacteremia and possible aspiration. Attending physician consulted pharmacy for antibiotic change to meropenem 1/15.   Plan: Scr/Renal function worsening. Scr 2.98>>4.45. CrCl <10  Will adjust meropenem to  1g IV every 24 hours for CrCl 10 ml/min.   Pharmacy will monitor renal function and adjust dose as needed.   Height: 5\' 8"  (172.7 cm) Weight: 54.8 kg (120 lb 13 oz) IBW/kg (Calculated) : 68.4  Temp (24hrs), Avg:97.3 F (36.3 C), Min:96.8 F (36 C), Max:97.7 F (36.5 C)  Recent Labs  Lab 04-16-2020 0349 April 16, 2020 0822 04/16/2020 1958 03/28/2020 0229 03/28/2020 1333  WBC 5.8  --   --  21.5*  --   CREATININE 2.98*  --   --   --  4.45*  LATICACIDVEN 3.2* 3.9* 4.0* 4.1* 5.5*    Estimated Creatinine Clearance: 8.6 mL/min (A) (by C-G formula based on SCr of 4.45 mg/dL (H)).    No Known Allergies  Antimicrobials this admission: 1/14 azithromycin/cefepime/vancomycin >> x1 1/14 Ceftriaxone/Flagyl >> 1/15 1/15 meropenem>>   Microbiology results: 1/14 BCID: E. Coli  1/14  UCx: pending 1/14 MRSA PCR: negative   Thank you for allowing pharmacy to be a part of this patient's care.  2/14, PharmD, BCPS Clinical Pharmacist 04/17/2020 3:55 PM

## 2020-04-27 NOTE — Death Summary Note (Signed)
DEATH SUMMARY   Patient Details  Name: Jordan Kline MRN: [id number] DOB: [birth year]  Admission/Discharge Information   Admit Date:  04/11/2020  Date of Death:  04-11-20  Time of Death:  23:12  Length of Stay: 1  Referring Physician: Sherron Monday, MD   Reason(s) for Hospitalization  Acute Hypoxic Respiratory Failure & Sepsis with Septic Shock  Diagnoses  Preliminary cause of death:  Secondary Diagnoses (including complications and co-morbidities):  Principal Problem:   HCAP (healthcare-associated pneumonia) Active Problems:   Recurrent UTI   Acute renal failure superimposed on stage 3b chronic kidney disease (HCC)   Atrial fibrillation with RVR (HCC)   Chronic CHF (HCC)   Severe sepsis with septic shock (HCC)   HTN (hypertension)   HLD (hyperlipidemia)   Stroke (HCC)   Iron deficiency anemia   Thrombocytopenia (HCC)   Bacteremia due to Gram-negative bacteria   Septic shock [facility])   Brief Hospital Course (including significant findings, care, treatment, and services provided and events leading to death)  Jordan Kline is a 87 y.o. year old male who presented to the ED from home via EMS with progressively worsening shortness of breath x1 day, fever and chills, cough with nausea and vomiting.  Patient has a chronic Foley catheter since 07/06/2014 which had been date changed 2 days prior per family.  Upon EMS arrival the patient was hypoxic with an SPO2 of 80% on room air and was placed on CPAP.  Upon arrival to the ED the patient was in respiratory distress and unable to speak in complete sentences, BiPAP was initiated.  Was found to be in severe sepsis with septic shock and maintained persistent hypotension after receiving 1.75 L of IV fluid resuscitation requiring vasopressor administration.  Labs significant for lactic acid of 3.2, BNP 07/06/2219, chest x-ray showing bilateral infiltrates and urinalysis consistent with a UTI showing a large amount of leukocyte, many bacteria and  WBC > 50.  At this point the patient's wife and son were consulted and goals of care discussion was had resulting in a limited code.  Patient was a DNR/DNI with allowances for BiPAP and vasopressor interventions and the patient was admitted to stepdown.  The patient remained BiPAP and vasopressor dependent using multiple agents and continuing to decline.  Labs on Apr 11, 2020 showed increasing lactic acid, Enterobacterales positive blood cultures, and worsening renal function and hyperkalemia.  Goals of care discussion was had in the late afternoon on 2020-04-11 where the patient's wife and son decided to move towards comfort measures only.  Additional family arrived that evening and comfort measures were initiated.  Patient was pronounced at July 06, 2310 with family bedside.  Pertinent Labs and Studies  Significant Diagnostic Studies DG CHEST PORT 1 VIEW  Result Date: April 11, 2020 CLINICAL DATA:  Acute respiratory failure EXAM: PORTABLE CHEST 1 VIEW COMPARISON:  04/07/2020 FINDINGS: The heart size and mediastinal contours are unchanged with mild cardiomegaly. Aortic knob calcifications are seen. There is slight interval worsening in the hazy airspace opacities within the left perihilar and right lung base. There is retrocardiac opacity. Probable small bilateral pleural effusions are seen IMPRESSION: Interval slight worsening in the bilateral airspace opacities. Small bilateral pleural effusions Electronically Signed   By: Jonna Clark M.D.   On: 2020/04/11 03:42   DG Chest Portable 1 View  Result Date: 04/03/2020 CLINICAL DATA:  Internal jugular catheter insertion attempt. EXAM: PORTABLE CHEST 1 VIEW COMPARISON:  10-Apr-2020 at 0336 hours FINDINGS: The cardiac silhouette is borderline enlarged. Aortic atherosclerosis is noted.  There is increased density in the right lung base which may reflect a combination of airspace opacity and a new small pleural effusion. Patchy bilateral airspace opacities elsewhere are  similar to the prior study. No pneumothorax is identified. IMPRESSION: Increased right basilar density which may reflect worsening pneumonia or atelectasis and a small pleural effusion. Unchanged bilateral airspace disease elsewhere. No pneumothorax. Electronically Signed   By: Sebastian Ache M.D.   On: 04/03/2020 14:47   DG Chest Portable 1 View  Result Date: 03/28/2020 CLINICAL DATA:  Respiratory distress EXAM: PORTABLE CHEST 1 VIEW COMPARISON:  05/15/2019 FINDINGS: Bilateral airspace disease, right greater than left. Heart is normal size. No effusions. No acute bony abnormality. IMPRESSION: Bilateral airspace opacities, right greater than left concerning for pneumonia. Electronically Signed   By: Charlett Nose M.D.   On: 04/20/2020 03:49   ECHOCARDIOGRAM COMPLETE  Result Date: May 01, 2020    ECHOCARDIOGRAM REPORT   Patient Name:   Jordan Kline Date of Exam: 05-01-2020 Medical Rec #:  [id number]        Height:       68.0 in Accession #:    [id number]       Weight:       120.8 lb Date of Birth:  01/20/1928        BSA:          1.650 m Patient Age:    87 years         BP:           94/68 mmHg Patient Gender: M                HR:           114 bpm. Exam Location:  [facility] Procedure: 2D Echo, Cardiac Doppler and Color Doppler Indications:     Elevated troponin  History:         Patient has prior history of Echocardiogram examinations, most                  recent 05/15/2019. Stroke, Arrythmias:Atrial Fibrillation; Risk                  Factors:Hypertension.  Sonographer:     Cristela Blue RDCS (AE) Referring Phys:  [id number] Oliver Pila HALL Diagnosing Phys: Adrian Blackwater MD IMPRESSIONS  1. Left ventricular ejection fraction, by estimation, is 20 to 25%. The left ventricle has severely decreased function. The left ventricle demonstrates global hypokinesis. The left ventricular internal cavity size was severely dilated. Left ventricular diastolic parameters are consistent with Grade III diastolic dysfunction (restrictive).   2. Right ventricular systolic function is severely reduced. The right ventricular size is severely enlarged.  3. Left atrial size was severely dilated.  4. Right atrial size was severely dilated.  5. The mitral valve is normal in structure. Mild to moderate mitral valve regurgitation. No evidence of mitral stenosis.  6. Tricuspid valve regurgitation is mild to moderate.  7. The aortic valve is normal in structure. Aortic valve regurgitation is not visualized. Mild aortic valve sclerosis is present, with no evidence of aortic valve stenosis.  8. The inferior vena cava is normal in size with greater than 50% respiratory variability, suggesting right atrial pressure of 3 mmHg. FINDINGS  Left Ventricle: Left ventricular ejection fraction, by estimation, is 20 to 25%. The left ventricle has severely decreased function. The left ventricle demonstrates global hypokinesis. The left ventricular internal cavity size was severely dilated. There is no left ventricular hypertrophy. Left ventricular diastolic parameters  are consistent with Grade III diastolic dysfunction (restrictive). Right Ventricle: The right ventricular size is severely enlarged. No increase in right ventricular wall thickness. Right ventricular systolic function is severely reduced. Left Atrium: Left atrial size was severely dilated. Right Atrium: Right atrial size was severely dilated. Pericardium: There is no evidence of pericardial effusion. Mitral Valve: The mitral valve is normal in structure. Mild to moderate mitral valve regurgitation. No evidence of mitral valve stenosis. Tricuspid Valve: The tricuspid valve is normal in structure. Tricuspid valve regurgitation is mild to moderate. No evidence of tricuspid stenosis. Aortic Valve: The aortic valve is normal in structure. Aortic valve regurgitation is not visualized. Mild aortic valve sclerosis is present, with no evidence of aortic valve stenosis. Aortic valve mean gradient measures 1.0 mmHg. Aortic  valve peak gradient measures 1.4 mmHg. Aortic valve area, by VTI measures 2.13 cm. Pulmonic Valve: The pulmonic valve was normal in structure. Pulmonic valve regurgitation is trivial. No evidence of pulmonic stenosis. Aorta: The aortic root is normal in size and structure. Venous: The inferior vena cava is normal in size with greater than 50% respiratory variability, suggesting right atrial pressure of 3 mmHg. IAS/Shunts: No atrial level shunt detected by color flow Doppler.  LEFT VENTRICLE PLAX 2D LVIDd:         4.31 cm LVIDs:         3.85 cm LV PW:         0.93 cm LV IVS:        0.80 cm LVOT diam:     2.00 cm LV SV:         14 LV SV Index:   9 LVOT Area:     3.14 cm  RIGHT VENTRICLE RV S prime:     8.16 cm/s TAPSE (M-mode): 2.6 cm LEFT ATRIUM           Index       RIGHT ATRIUM           Index LA diam:      4.80 cm 2.91 cm/m  RA Area:     22.70 cm LA Vol (A4C): 72.7 ml 44.07 ml/m RA Volume:   57.20 ml  34.67 ml/m  AORTIC VALVE                   PULMONIC VALVE AV Area (Vmax):    2.02 cm    PV Vmax:        0.40 m/s AV Area (Vmean):   1.71 cm    PV Peak grad:   0.6 mmHg AV Area (VTI):     2.13 cm    RVOT Peak grad: 1 mmHg AV Vmax:           58.80 cm/s AV Vmean:          43.800 cm/s AV VTI:            0.068 m AV Peak Grad:      1.4 mmHg AV Mean Grad:      1.0 mmHg LVOT Vmax:         37.90 cm/s LVOT Vmean:        23.900 cm/s LVOT VTI:          0.046 m LVOT/AV VTI ratio: 0.68  AORTA Ao Root diam: 3.25 cm MITRAL VALVE               TRICUSPID VALVE MV Area (PHT): 5.58 cm    TR Peak grad:   36.7 mmHg MV Decel Time:  136 msec    TR Vmax:        303.00 cm/s MV E velocity: 69.80 cm/s                            SHUNTS                            Systemic VTI:  0.05 m                            Systemic Diam: 2.00 cm Adrian Blackwater MD Electronically signed by Adrian Blackwater MD Signature Date/Time: 04/20/2020/6:25:37 PM    Final     Microbiology Recent Results (from the past 240 hour(s))  Blood Culture (routine x 2)      Status: None (Preliminary result)   Collection Time: 2020-04-18  3:49 AM   Specimen: BLOOD  Result Value Ref Range Status   Specimen Description BLOOD RIGHT [facility]  Final   Special Requests   Final    BOTTLES DRAWN AEROBIC AND ANAEROBIC Blood Culture adequate volume   Culture  Setup Time   Final    Organism ID to follow GRAM NEGATIVE RODS IN BOTH AEROBIC AND ANAEROBIC BOTTLES CRITICAL RESULT CALLED TO, READ BACK BY AND VERIFIED WITH: CARISSA [facility] ON 04-18-20 AT 1346 QSD Performed at [facility], [address] [place], [place] [address]    Culture GRAM NEGATIVE RODS  Final   Report Status PENDING  Incomplete  Urine culture     Status: Abnormal (Preliminary result)   Collection Time: 18-Apr-2020  3:49 AM   Specimen: In/Out Cath Urine  Result Value Ref Range Status   Specimen Description   Final    IN/OUT CATH URINE Performed at [facility], [address], [place], [place] [address]    Special Requests   Final    NONE Performed at [facility], [address] [place], [place] [address]    Culture (A)  Final    >=100,000 COLONIES/mL ESCHERICHIA COLI SUSCEPTIBILITIES TO FOLLOW Performed at [facility], [address] [place], [place] [address]    Report Status PENDING  Incomplete  Blood Culture ID Panel (Reflexed)     Status: Abnormal   Collection Time: April 18, 2020  3:49 AM  Result Value Ref Range Status   Enterococcus faecalis NOT DETECTED NOT DETECTED Final   Enterococcus Faecium NOT DETECTED NOT DETECTED Final   Listeria monocytogenes NOT DETECTED NOT DETECTED Final   Staphylococcus species NOT DETECTED NOT DETECTED Final   Staphylococcus aureus (BCID) NOT DETECTED NOT DETECTED Final   Staphylococcus epidermidis NOT DETECTED NOT DETECTED Final   Staphylococcus lugdunensis NOT DETECTED NOT DETECTED Final   Streptococcus species NOT DETECTED NOT DETECTED Final   Streptococcus agalactiae NOT DETECTED NOT DETECTED Final   Streptococcus  pneumoniae NOT DETECTED NOT DETECTED Final   Streptococcus pyogenes NOT DETECTED NOT DETECTED Final   A.calcoaceticus-baumannii NOT DETECTED NOT DETECTED Final   Bacteroides fragilis NOT DETECTED NOT DETECTED Final   Enterobacterales DETECTED (A) NOT DETECTED Final    Comment: Enterobacterales represent a large order of gram negative bacteria, not a single organism. CRITICAL RESULT CALLED TO, READ BACK BY AND VERIFIED WITH: CARISSA DOLAN ON 04/18/2020 AT 1346 QSD    Enterobacter cloacae complex NOT DETECTED NOT DETECTED Final   Escherichia coli DETECTED (A) NOT DETECTED Final    Comment:  CRITICAL RESULT CALLED TO, READ BACK BY AND VERIFIED WITH: CARISSA DOLAN ON May 07, 2020 AT 1346 QSD    Klebsiella aerogenes NOT DETECTED NOT DETECTED Final   Klebsiella oxytoca NOT DETECTED NOT DETECTED Final   Klebsiella pneumoniae NOT DETECTED NOT DETECTED Final   Proteus species NOT DETECTED NOT DETECTED Final   Salmonella species NOT DETECTED NOT DETECTED Final   Serratia marcescens NOT DETECTED NOT DETECTED Final   Haemophilus influenzae NOT DETECTED NOT DETECTED Final   Neisseria meningitidis NOT DETECTED NOT DETECTED Final   Pseudomonas aeruginosa NOT DETECTED NOT DETECTED Final   Stenotrophomonas maltophilia NOT DETECTED NOT DETECTED Final   Candida albicans NOT DETECTED NOT DETECTED Final   Candida auris NOT DETECTED NOT DETECTED Final   Candida glabrata NOT DETECTED NOT DETECTED Final   Candida krusei NOT DETECTED NOT DETECTED Final   Candida parapsilosis NOT DETECTED NOT DETECTED Final   Candida tropicalis NOT DETECTED NOT DETECTED Final   Cryptococcus neoformans/gattii NOT DETECTED NOT DETECTED Final   CTX-M ESBL NOT DETECTED NOT DETECTED Final   Carbapenem resistance IMP NOT DETECTED NOT DETECTED Final   Carbapenem resistance KPC NOT DETECTED NOT DETECTED Final   Carbapenem resistance NDM NOT DETECTED NOT DETECTED Final   Carbapenem resist OXA 48 LIKE NOT DETECTED NOT DETECTED Final    Carbapenem resistance VIM NOT DETECTED NOT DETECTED Final    Comment: Performed at [facility], [address] [place], [place] [address]  Blood Culture (routine x 2)     Status: None (Preliminary result)   Collection Time: 05/07/2020  4:12 AM   Specimen: BLOOD  Result Value Ref Range Status   Specimen Description BLOOD RIGHT ANTECUBITAL  Final   Special Requests   Final    BOTTLES DRAWN AEROBIC AND ANAEROBIC Blood Culture adequate volume   Culture  Setup Time   Final    GRAM NEGATIVE RODS IN BOTH AEROBIC AND ANAEROBIC BOTTLES CRITICAL VALUE NOTED.  VALUE IS CONSISTENT WITH PREVIOUSLY REPORTED AND CALLED VALUE. Performed at [facility], [address] [place], [place] [address]    Culture GRAM NEGATIVE RODS  Final   Report Status PENDING  Incomplete  Resp Panel by RT-PCR (Flu A&B, Covid) Nasopharyngeal Swab     Status: None   Collection Time: 2020-05-07  4:12 AM   Specimen: Nasopharyngeal Swab; Nasopharyngeal(NP) swabs in vial transport medium  Result Value Ref Range Status   SARS Coronavirus 2 by RT PCR NEGATIVE NEGATIVE Final    Comment: (NOTE) SARS-CoV-2 target nucleic acids are NOT DETECTED.  The SARS-CoV-2 RNA is generally detectable in upper respiratory specimens during the acute phase of infection. The lowest concentration of SARS-CoV-2 viral copies this assay can detect is 138 copies/mL. A negative result does not preclude SARS-Cov-2 infection and should not be used as the sole basis for treatment or other patient management decisions. A negative result may occur with  improper specimen collection/handling, submission of specimen other than nasopharyngeal swab, presence of viral mutation(s) within the areas targeted by this assay, and inadequate number of viral copies(<138 copies/mL). A negative result must be combined with clinical observations, patient history, and epidemiological information. The expected result is Negative.  Fact Sheet for  Patients:  BloggerCourse.com  Fact Sheet for Healthcare Providers:  SeriousBroker.it  This test is no t yet approved or cleared by the [place] FDA and  has been authorized for detection and/or diagnosis of SARS-CoV-2 by FDA under an Emergency Use Authorization (EUA). This EUA will remain  in  effect (meaning this test can be used) for the duration of the COVID-19 declaration under Section 564(b)(1) of the Act, 21 U.S.C.section 360bbb-3(b)(1), unless the authorization is terminated  or revoked sooner.       Influenza A by PCR NEGATIVE NEGATIVE Final   Influenza B by PCR NEGATIVE NEGATIVE Final    Comment: (NOTE) The Xpert Xpress SARS-CoV-2/FLU/RSV plus assay is intended as an aid in the diagnosis of influenza from Nasopharyngeal swab specimens and should not be used as a sole basis for treatment. Nasal washings and aspirates are unacceptable for Xpert Xpress SARS-CoV-2/FLU/RSV testing.  Fact Sheet for Patients: BloggerCourse.comhttps://www.fda.gov/media/152166/download  Fact Sheet for Healthcare Providers: SeriousBroker.ithttps://www.fda.gov/media/152162/download  This test is not yet approved or cleared by the [place] FDA and has been authorized for detection and/or diagnosis of SARS-CoV-2 by FDA under an Emergency Use Authorization (EUA). This EUA will remain in effect (meaning this test can be used) for the duration of the COVID-19 declaration under Section 564(b)(1) of the Act, 21 U.S.C. section 360bbb-3(b)(1), unless the authorization is terminated or revoked.  Performed at [facility], [address] [place], [place] [address]   MRSA PCR Screening     Status: None   Collection Time: Jan 07, 2021  9:58 AM   Specimen: Nasopharyngeal  Result Value Ref Range Status   MRSA by PCR NEGATIVE NEGATIVE Final    Comment:        The GeneXpert MRSA Assay (FDA approved for NASAL specimens only), is one component of a comprehensive MRSA  colonization surveillance program. It is not intended to diagnose MRSA infection nor to guide or monitor treatment for MRSA infections. Performed at [facility], [address] [place], [place] [address]   MRSA PCR Screening     Status: None   Collection Time: 04/16/2020  1:28 PM   Specimen: Nasopharyngeal  Result Value Ref Range Status   MRSA by PCR NEGATIVE NEGATIVE Final    Comment:        The GeneXpert MRSA Assay (FDA approved for NASAL specimens only), is one component of a comprehensive MRSA colonization surveillance program. It is not intended to diagnose MRSA infection nor to guide or monitor treatment for MRSA infections. Performed at [facility], [address] [place], [place] [address]     Lab Basic Metabolic Panel: Recent Labs  Lab Jan 07, 2021 0349 04/08/2020 1333  NA 140 138  K 4.3 6.7*  CL 107 107  CO2 22 17*  GLUCOSE 124* 137*  BUN 69* 91*  CREATININE 2.98* 4.45*  CALCIUM 8.7* 7.2*  PHOS  --  8.0*   Liver Function Tests: Recent Labs  Lab Jan 07, 2021 0349 04/03/2020 1333  AST 28  --   ALT 20  --   ALKPHOS 84  --   BILITOT 0.7  --   PROT 5.7*  --   ALBUMIN 2.8* 1.9*   No results for input(s): LIPASE, AMYLASE in the last 168 hours. No results for input(s): AMMONIA in the last 168 hours. CBC: Recent Labs  Lab Jan 07, 2021 0349 03/27/2020 0229  WBC 5.8 21.5*  NEUTROABS 5.2 19.4*  HGB 10.8* 8.5*  HCT 34.0* 26.7*  MCV 101.5* 103.1*  PLT 107* 73*   Cardiac Enzymes: No results for input(s): CKTOTAL, CKMB, CKMBINDEX, TROPONINI in the last 168 hours. Sepsis Labs: Recent Labs  Lab Jan 07, 2021 0349 Jan 07, 2021 0822 Jan 07, 2021 1958 04/07/2020 0229 04/03/2020 1333 03/27/2020 1638  PROCALCITON  --  11.76  --  >150.00  --   --   WBC  5.8  --   --  21.5*  --   --   LATICACIDVEN 3.2* 3.9* 4.0* 4.1* 5.5* 5.2*    Procedures/Operations  04/26/2020 right femoral CVC 3L   Cheryll Cockayne Rust-Chester, AGACNP-BC Acute Care Nurse Practitioner [place]  [facility]   [phone] / [phone] Please see Amion for pager details.    Deundra Furber L Rust-Chester 04/08/2020, 11:30 PM

## 2020-04-27 NOTE — ED Notes (Signed)
Called pharmacy, as Bicarb gtt not yet received

## 2020-04-27 DEATH — deceased
